# Patient Record
Sex: Male | Born: 1935
Health system: Southern US, Community
[De-identification: ages and names within clinical notes are randomized; demographics above are authoritative.]

## PROBLEM LIST (undated history)

## (undated) DIAGNOSIS — K5090 Crohn's disease, unspecified, without complications: Secondary | ICD-10-CM

## (undated) DIAGNOSIS — I739 Peripheral vascular disease, unspecified: Secondary | ICD-10-CM

## (undated) DIAGNOSIS — I2510 Atherosclerotic heart disease of native coronary artery without angina pectoris: Secondary | ICD-10-CM

## (undated) DIAGNOSIS — I340 Nonrheumatic mitral (valve) insufficiency: Secondary | ICD-10-CM

## (undated) DIAGNOSIS — D649 Anemia, unspecified: Secondary | ICD-10-CM

## (undated) DIAGNOSIS — E7800 Pure hypercholesterolemia, unspecified: Secondary | ICD-10-CM

## (undated) DIAGNOSIS — I779 Disorder of arteries and arterioles, unspecified: Secondary | ICD-10-CM

## (undated) DIAGNOSIS — I350 Nonrheumatic aortic (valve) stenosis: Secondary | ICD-10-CM

## (undated) DIAGNOSIS — K922 Gastrointestinal hemorrhage, unspecified: Secondary | ICD-10-CM

## (undated) DIAGNOSIS — I10 Essential (primary) hypertension: Secondary | ICD-10-CM

## (undated) DIAGNOSIS — I219 Acute myocardial infarction, unspecified: Secondary | ICD-10-CM

## (undated) DIAGNOSIS — C4490 Unspecified malignant neoplasm of skin, unspecified: Secondary | ICD-10-CM

## (undated) DIAGNOSIS — K449 Diaphragmatic hernia without obstruction or gangrene: Secondary | ICD-10-CM

## (undated) DIAGNOSIS — M1990 Unspecified osteoarthritis, unspecified site: Secondary | ICD-10-CM

## (undated) DIAGNOSIS — R42 Dizziness and giddiness: Secondary | ICD-10-CM

## (undated) HISTORY — PX: INGUINAL HERNIA REPAIR: SUR1180

## (undated) HISTORY — PX: CORONARY STENT PLACEMENT: SHX1402

## (undated) HISTORY — PX: CHOLECYSTECTOMY: SHX55

## (undated) HISTORY — DX: Nonrheumatic mitral (valve) insufficiency: I34.0

## (undated) HISTORY — DX: Anemia, unspecified: D64.9

## (undated) HISTORY — DX: Disorder of arteries and arterioles, unspecified: I77.9

## (undated) HISTORY — DX: Peripheral vascular disease, unspecified: I73.9

## (undated) HISTORY — DX: Nonrheumatic aortic (valve) stenosis: I35.0

## (undated) HISTORY — DX: Gastrointestinal hemorrhage, unspecified: K92.2

---

## 1998-04-11 ENCOUNTER — Encounter: Payer: Self-pay | Admitting: Family Medicine

## 1998-04-11 ENCOUNTER — Ambulatory Visit (HOSPITAL_COMMUNITY): Admission: RE | Admit: 1998-04-11 | Discharge: 1998-04-11 | Payer: Self-pay | Admitting: Family Medicine

## 1998-08-17 ENCOUNTER — Encounter: Payer: Self-pay | Admitting: Gastroenterology

## 1998-08-17 ENCOUNTER — Ambulatory Visit (HOSPITAL_COMMUNITY): Admission: RE | Admit: 1998-08-17 | Discharge: 1998-08-17 | Payer: Self-pay | Admitting: Gastroenterology

## 1998-11-10 ENCOUNTER — Encounter: Payer: Self-pay | Admitting: Emergency Medicine

## 1998-11-10 ENCOUNTER — Inpatient Hospital Stay (HOSPITAL_COMMUNITY): Admission: EM | Admit: 1998-11-10 | Discharge: 1998-11-13 | Payer: Self-pay | Admitting: Emergency Medicine

## 1999-05-31 ENCOUNTER — Ambulatory Visit (HOSPITAL_COMMUNITY): Admission: RE | Admit: 1999-05-31 | Discharge: 1999-06-01 | Payer: Self-pay | Admitting: Cardiology

## 1999-05-31 DIAGNOSIS — I219 Acute myocardial infarction, unspecified: Secondary | ICD-10-CM

## 1999-05-31 HISTORY — DX: Acute myocardial infarction, unspecified: I21.9

## 1999-06-18 ENCOUNTER — Encounter (HOSPITAL_COMMUNITY): Admission: RE | Admit: 1999-06-18 | Discharge: 1999-09-16 | Payer: Self-pay | Admitting: Family Medicine

## 1999-09-17 ENCOUNTER — Encounter (HOSPITAL_COMMUNITY): Admission: RE | Admit: 1999-09-17 | Discharge: 1999-10-02 | Payer: Self-pay | Admitting: Family Medicine

## 1999-10-09 ENCOUNTER — Ambulatory Visit (HOSPITAL_COMMUNITY): Admission: RE | Admit: 1999-10-09 | Discharge: 1999-10-09 | Payer: Self-pay | Admitting: Gastroenterology

## 1999-11-07 ENCOUNTER — Encounter: Payer: Self-pay | Admitting: Gastroenterology

## 1999-11-07 ENCOUNTER — Ambulatory Visit (HOSPITAL_COMMUNITY): Admission: RE | Admit: 1999-11-07 | Discharge: 1999-11-07 | Payer: Self-pay | Admitting: Gastroenterology

## 2000-03-03 ENCOUNTER — Encounter: Admission: RE | Admit: 2000-03-03 | Discharge: 2000-03-03 | Payer: Self-pay | Admitting: Family Medicine

## 2000-03-03 ENCOUNTER — Encounter: Payer: Self-pay | Admitting: Family Medicine

## 2001-04-23 ENCOUNTER — Encounter: Payer: Self-pay | Admitting: Family Medicine

## 2001-04-23 ENCOUNTER — Encounter: Admission: RE | Admit: 2001-04-23 | Discharge: 2001-04-23 | Payer: Self-pay | Admitting: Family Medicine

## 2001-05-04 ENCOUNTER — Encounter: Admission: RE | Admit: 2001-05-04 | Discharge: 2001-05-04 | Payer: Self-pay | Admitting: Family Medicine

## 2001-05-04 ENCOUNTER — Encounter: Payer: Self-pay | Admitting: Family Medicine

## 2003-02-16 ENCOUNTER — Encounter: Admission: RE | Admit: 2003-02-16 | Discharge: 2003-02-16 | Payer: Self-pay | Admitting: Family Medicine

## 2003-02-16 ENCOUNTER — Encounter: Payer: Self-pay | Admitting: Family Medicine

## 2003-03-29 ENCOUNTER — Encounter: Payer: Self-pay | Admitting: Gastroenterology

## 2003-03-29 ENCOUNTER — Encounter: Admission: RE | Admit: 2003-03-29 | Discharge: 2003-03-29 | Payer: Self-pay | Admitting: Gastroenterology

## 2004-09-03 ENCOUNTER — Ambulatory Visit (HOSPITAL_COMMUNITY): Admission: RE | Admit: 2004-09-03 | Discharge: 2004-09-03 | Payer: Self-pay | Admitting: Gastroenterology

## 2005-10-28 ENCOUNTER — Encounter: Admission: RE | Admit: 2005-10-28 | Discharge: 2005-10-28 | Payer: Self-pay | Admitting: Gastroenterology

## 2006-10-16 ENCOUNTER — Encounter: Admission: RE | Admit: 2006-10-16 | Discharge: 2006-10-16 | Payer: Self-pay | Admitting: Family Medicine

## 2007-10-29 ENCOUNTER — Ambulatory Visit: Payer: Self-pay | Admitting: Vascular Surgery

## 2009-04-18 ENCOUNTER — Ambulatory Visit (HOSPITAL_COMMUNITY): Admission: RE | Admit: 2009-04-18 | Discharge: 2009-04-18 | Payer: Self-pay | Admitting: Gastroenterology

## 2009-11-16 ENCOUNTER — Encounter: Admission: RE | Admit: 2009-11-16 | Discharge: 2009-11-16 | Payer: Self-pay | Admitting: Gastroenterology

## 2010-07-07 ENCOUNTER — Encounter: Payer: Self-pay | Admitting: Gastroenterology

## 2010-10-29 NOTE — Procedures (Signed)
CAROTID DUPLEX EXAM   INDICATION:  Followup, carotid artery disease.   HISTORY:  Diabetes:  No.  Cardiac:  Stents.  Hypertension:  Yes.  Smoking:  Quit, 1960.  Previous Surgery:  No.  CV History:  No.  Amaurosis Fugax No, Paresthesias No, Hemiparesis No                                       RIGHT               LEFT  Brachial systolic pressure:         134                 130  Brachial Doppler waveforms:         Biphasic            Biphasic  Vertebral direction of flow:        Antegrade           Antegrade  DUPLEX VELOCITIES (cm/sec)  CCA peak systolic                   66                  84  ECA peak systolic                   232                 622  ICA peak systolic                   131                 95  ICA end diastolic                   40                  34  PLAQUE MORPHOLOGY:                  Calcified           Calcified  PLAQUE AMOUNT:                      Mild/Moderate       Mild  PLAQUE LOCATION:                    ICA/bifurcation/ECA ICA/bifurcation   IMPRESSION:  1. Right internal carotid artery stenosis with 40-59%.  2. Left internal carotid artery stenosis of 20-39%.  3. Right external carotid artery stenosis.  4. No significant changes from the previous study.   ___________________________________________  Nelda Severe Kellie Simmering, M.D.   AS/MEDQ  D:  10/29/2007  T:  10/29/2007  Job:  [id number]

## 2010-11-01 NOTE — Op Note (Signed)
Colin Larson, Colin Larson NO.:  [id number]   MEDICAL RECORD NO.:  [id number]          PATIENT TYPE:  AMB   LOCATION:  ENDO                         FACILITY:  [facility]   PHYSICIAN:  James L. Rolla Flatten., M.D.DATE OF BIRTH:  04/05/1934   DATE OF PROCEDURE:  09/03/2004  DATE OF DISCHARGE:                                 OPERATIVE REPORT   PROCEDURE:  Esophagogastroduodenoscopy with Savory dilatation.   MEDICATIONS:  Fentanyl 40 mcg, Versed 6 mg IV.   INDICATIONS:  The patient with a history of esophageal stricture.  He has  had this dilated in the past to 22 [place].  He has Crohn's disease which is  stable.  He has been on Nexium but then stopped it for unclear reasons.  He  has developed increasing dysphagia and for this reason repeat dilatation is  performed.   DESCRIPTION OF PROCEDURE:  The procedure including the potential risks and  benefits have been discussed with him again in the office and consent  obtained.  In the left lateral decubitus position, the Olympus scope was  inserted.  We reached the distal esophagus and in fact, there was a  stricture seen.  It was photographed.  The scope was easily able to pass.  It was a 3-4 cm hiatal hernia.  The stomach was entered, pylorus identified,  passed the duodenum including the bulb and second portion was seen,  __________ unremarkable.  The scope was withdrawn back in the stomach,  pyloric channel, antrum were normal.  Fundus and cardia were seen on  retroflexion view and were normal.  Using the portable fluoroscopy unit, the  Savory guidewire was placed through the scope and the scope was withdrawn  over the guidewire.  With the patient's neck then fully extended, we passed  a 42, 45, and 48 [place] Savory dilator with small amount of heme with the 48  [place] dilator.  This was removed with the guidewire as an unit.  The  patient tolerated the procedure well.  There were no immediate  complications.   ASSESSMENT:   Esophageal stricture, dilated to 30 [place], 530.3.   PLAN:  Routine post-dilatation orders.  We will repeat on an as-needed basis  and we will strongly recommend that he take Prilosec OTC chronically.      JLE/MEDQ  D:  09/03/2004  T:  09/03/2004  Job:  [id number]   cc:   Osvaldo Human, M.D.  [address]  [place] [address]  Fax: [phone]

## 2011-11-12 ENCOUNTER — Emergency Department (HOSPITAL_COMMUNITY)
Admission: EM | Admit: 2011-11-12 | Discharge: 2011-11-12 | Disposition: A | Discharge status: Home / Self Care | Payer: Medicare Other | Attending: Emergency Medicine | Admitting: Emergency Medicine

## 2011-11-12 ENCOUNTER — Emergency Department (HOSPITAL_COMMUNITY): Payer: Medicare Other

## 2011-11-12 ENCOUNTER — Encounter (HOSPITAL_COMMUNITY): Payer: Self-pay

## 2011-11-12 DIAGNOSIS — I252 Old myocardial infarction: Secondary | ICD-10-CM | POA: Insufficient documentation

## 2011-11-12 DIAGNOSIS — I2510 Atherosclerotic heart disease of native coronary artery without angina pectoris: Secondary | ICD-10-CM | POA: Insufficient documentation

## 2011-11-12 DIAGNOSIS — E7800 Pure hypercholesterolemia, unspecified: Secondary | ICD-10-CM | POA: Insufficient documentation

## 2011-11-12 DIAGNOSIS — M25519 Pain in unspecified shoulder: Secondary | ICD-10-CM | POA: Insufficient documentation

## 2011-11-12 DIAGNOSIS — R079 Chest pain, unspecified: Secondary | ICD-10-CM | POA: Insufficient documentation

## 2011-11-12 DIAGNOSIS — I10 Essential (primary) hypertension: Secondary | ICD-10-CM | POA: Insufficient documentation

## 2011-11-12 DIAGNOSIS — K5090 Crohn's disease, unspecified, without complications: Secondary | ICD-10-CM | POA: Insufficient documentation

## 2011-11-12 DIAGNOSIS — M79609 Pain in unspecified limb: Secondary | ICD-10-CM | POA: Insufficient documentation

## 2011-11-12 HISTORY — DX: Unspecified osteoarthritis, unspecified site: M19.90

## 2011-11-12 HISTORY — DX: Atherosclerotic heart disease of native coronary artery without angina pectoris: I25.10

## 2011-11-12 HISTORY — DX: Pure hypercholesterolemia, unspecified: E78.00

## 2011-11-12 HISTORY — DX: Diaphragmatic hernia without obstruction or gangrene: K44.9

## 2011-11-12 HISTORY — DX: Crohn's disease, unspecified, without complications: K50.90

## 2011-11-12 HISTORY — DX: Acute myocardial infarction, unspecified: I21.9

## 2011-11-12 HISTORY — DX: Essential (primary) hypertension: I10

## 2011-11-12 HISTORY — DX: Unspecified malignant neoplasm of skin, unspecified: C44.90

## 2011-11-12 LAB — CBC
MCH: 29.4 pg (ref 26.0–34.0)
MCV: 87.7 fL (ref 78.0–100.0)
Platelets: 192 10*3/uL (ref 150–400)
RDW: 13.5 % (ref 11.5–15.5)
WBC: 4.9 10*3/uL (ref 4.0–10.5)

## 2011-11-12 LAB — POCT I-STAT, CHEM 8
Calcium, Ion: 1.13 mmol/L (ref 1.12–1.32)
Chloride: 106 mEq/L (ref 96–112)
HCT: 35 % — ABNORMAL LOW (ref 39.0–52.0)
TCO2: 24 mmol/L (ref 0–100)

## 2011-11-12 LAB — POCT I-STAT TROPONIN I: Troponin i, poc: 0 ng/mL (ref 0.00–0.08)

## 2011-11-12 NOTE — ED Notes (Signed)
The patient states that he had a brief five second period of chest pain one hour ago while bearing down during BM.  He states that the pain was located in his left center chest and that it radiated to his left arm and his back.  He denies N/V, diaphoresis, and SOB.  The patient states he did NOT take any of his SL nitro-stat, but he did take ASA 324 mg, po prior to arrival.  The patient has a hx of an MI on May 31, 1999 at which time he had stents x3 placed.

## 2011-11-12 NOTE — ED Notes (Signed)
Deneis  Pain or SOB.  Monitor with SR rate 75   Room air sats 96%

## 2011-11-12 NOTE — Discharge Instructions (Signed)
Your caregiver has diagnosed you as having chest pain that is nonspecific for one problem. This means that after looking at you and examining you and ordering tests (such as blood work, chest x-rays and EKG), your caregiver does not believe that the problem is serious enough to need watching in the hospital. This judgment is often made after testing shows no acute heart attack and you are at low risk for sudden acute heart condition. Chest pain comes from many different causes.  Seek immediate medical attention if:   You have severe chest pain, especially if the pain is crushing or pressure-like and spreads to the arms, back, neck, or jaw, or if you have sweating, nausea, shortness of breath. This is an emergency. Don't wait to see if the pain will go away. Get medical help at once. Call 911 immediately. Do not drive herself to the hospital.   Your chest pain gets worse and does not go away with rest.   You have an attack of chest pain lasting longer than usual, despite rest and treatment with the medications your caregiver has prescribed   You awaken from sleep with chest pain or shortness of breath.   You feel faint or dizzy   You have chest pain not typical of your usual pain for which you originally saw your caregiver.  You must have a repeat evaluation within 24 hours for a recheck of your heart.  Please call your doctor this morning to schedule this appointment. If you do not have a family doctor, please see the list of doctors below.  RESOURCE GUIDE  Dental Problems  Patients with Medicaid: [place] Phone:  [phone]                                                  Phone:  [phone]  If unable to pay or uninsured, contact:  [facility] or [facility]. to become qualified for the adult dental clinic.  Chronic Pain  Problems Contact [facility]  [phone] Patients need to be referred by their primary care doctor.  Insufficient Money for Medicine Contact [employer]:  call "211" or [place] [phone].  No Primary Care Doctor Call [facility]  [phone] Other agencies that provide inexpensive medical care    [place]  [phone]    [facility] Internal [facility]  [place]  [phone]    [facility]  [phone]    [facility]  [place]  [place]  [phone] Portal   [phone] (emergency services [phone])  Sub[facility]  [phone] [facility] [phone] [facility] [place] [phone] Chinita Pester [phone] Residential & Outpatient Substance Abuse Program  [place] [phone] [place]  [place] [phone] (After Hours)  Emergency [place] [phone]  [place] at the [place] [phone] [place] [phone]  MRSA Hotline #:   5163944898    [facility] Dept. [address]      [place] Phone:  [phone]                                   Phone:  [phone]                 Phone:  [place] Phone:  [place] [phone] (After Hours)

## 2011-11-12 NOTE — ED Provider Notes (Signed)
History     CSN: [id number]  Arrival date & time 11/12/11  0308   First MD Initiated Contact with Patient 11/12/11 [phone]      Chief Complaint  Patient presents with  . Chest Pain    (Consider location/radiation/quality/duration/timing/severity/associated sxs/prior treatment) HPI Comments: 77 year old male with a history of coronary artery disease status post stenting in the year 2000, hypercholesterolemia, hypertension. He presents with a complaint of chest pain which was left-sided. He states this was acute in onset while he was using the bathroom having a bowel movement. It lasted for approximately 3 seconds, radiated to the shoulder and the arm and then completely went away. Again it only lasted for 3 seconds and he has had no symptoms since that time. This occurred just prior to arrival. He denies headaches, blurred vision, sore throat, cough or shortness of breath, back pain, abdominal pain, dysuria, diarrhea, rectal bleeding, swelling, rashes, weakness, numbness. He denies that this is anything that he has ever had in the past  Patient is a 77 y.o. male presenting with chest pain. The history is provided by the patient.  Chest Pain     Past Medical History  Diagnosis Date  . MI (myocardial infarction) [id number]  . Crohn disease   . Hiatal hernia     no surgery  . Hypertension   . Hypercholesteremia   . Arthritis   . Skin cancer   . Coronary artery disease     Past Surgical History  Procedure Date  . Coronary stent placement [id number]  . Cholecystectomy   . Inguinal hernia repair     x 2    Family History  Problem Relation Age of Onset  . Cancer Mother   . Stroke Father     History  Substance Use Topics  . Smoking status: Former Smoker -- 0.1 packs/day for 5 years    Quit date: 02/15/1975  . Smokeless tobacco: Not on file  . Alcohol Use: No      Review of Systems  Cardiovascular: Positive for chest pain.  All other systems reviewed and are  negative.    Allergies  Bee venom  Home Medications   Current Outpatient Rx  Name Route Sig Dispense Refill  . ASPIRIN 325 MG PO TABS Oral Take 325 mg by mouth daily.    Marland Kitchen EZETIMIBE 10 MG PO TABS Oral Take 10 mg by mouth daily.    Marland Kitchen MESALAMINE 400 MG PO TBEC Oral Take 400 mg by mouth 2 (two) times daily.    Marland Kitchen NITROGLYCERIN 0.4 MG SL SUBL Sublingual Place 0.4 mg under the tongue every 5 (five) minutes as needed. For chest pain    . OMEPRAZOLE 20 MG PO CPDR Oral Take 20 mg by mouth 2 (two) times daily.    Marland Kitchen RAMIPRIL 5 MG PO CAPS Oral Take 5 mg by mouth daily.    Marland Kitchen ROSUVASTATIN CALCIUM 20 MG PO TABS Oral Take 20 mg by mouth daily.      BP 155/83  Pulse 72  Temp(Src) 97.8 F (36.6 C) (Oral)  Resp 18  Ht 5' 7"  (1.702 m)  Wt 172 lb (78.019 kg)  BMI 26.94 kg/m2  SpO2 97%  Physical Exam  Nursing note and vitals reviewed. Constitutional: He appears well-developed and well-nourished. No distress.  HENT:  Head: Normocephalic and atraumatic.  Mouth/Throat: Oropharynx is clear and moist. No oropharyngeal exudate.  Eyes: Conjunctivae and EOM are normal. Pupils are equal, round, and reactive to light. Right eye exhibits no discharge.  Left eye exhibits no discharge. No scleral icterus.  Neck: Normal range of motion. Neck supple. No JVD present. No thyromegaly present.  Cardiovascular: Normal rate, regular rhythm, normal heart sounds and intact distal pulses.  Exam reveals no gallop and no friction rub.   No murmur heard. Pulmonary/Chest: Effort normal and breath sounds normal. No respiratory distress. He has no wheezes. He has no rales.  Abdominal: Soft. Bowel sounds are normal. He exhibits no distension and no mass. There is no tenderness.  Musculoskeletal: Normal range of motion. He exhibits no edema and no tenderness.  Lymphadenopathy:    He has no cervical adenopathy.  Neurological: He is alert. Coordination normal.  Skin: Skin is warm and dry. No rash noted. No erythema.   Psychiatric: He has a normal mood and affect. His behavior is normal.    ED Course  Procedures (including critical care time)  ED ECG REPORT   Date: 11/12/2011   Rate: 73  Rhythm: normal sinus rhythm  QRS Axis: normal  Intervals: normal  ST/T Wave abnormalities: nonspecific T wave changes  Conduction Disutrbances:none  Narrative Interpretation:   Old EKG Reviewed: none available   Labs Reviewed  CBC - Abnormal; Notable for the following:    RBC 3.98 (*)    Hemoglobin 11.7 (*)    HCT 34.9 (*)    All other components within normal limits  POCT I-STAT, CHEM 8 - Abnormal; Notable for the following:    Hemoglobin 11.9 (*)    HCT 35.0 (*)    All other components within normal limits  POCT I-STAT TROPONIN I   Dg Chest Port 1 View  11/12/2011  *RADIOLOGY REPORT*  Clinical Data: Shortness of breath and chest discomfort.  PORTABLE CHEST - 1 VIEW  Comparison: None.  Findings: Shallow inspiration.  Heart size and pulmonary vascularity are normal for technique.  No focal airspace consolidation in the lungs.  No blunting of costophrenic angles. No pneumothorax.  Tortuous aorta.  IMPRESSION: No evidence of active pulmonary disease.  Original Report Authenticated By: Neale Burly, M.D.     1. Chest pain       MDM  Physical exam is totally normal at this time, his blood pressure is 155/83 and his EKG is normal other than some nonspecific changes in the inferior leads. I do not have an old EKG with which to compare. There is no signs of ST elevation or ST depression.  Patient reevaluated and has had no further chest pain since arrival, x-ray and labs without any significant findings, these were discussed with the patient and he was encouraged to follow up very closely. Doubt acute coronary syndrome given the way the patient explained his symptoms for only several seconds of pain.  Johnna Acosta, MD 11/12/11 [phone]

## 2011-12-29 DIAGNOSIS — E7800 Pure hypercholesterolemia, unspecified: Secondary | ICD-10-CM | POA: Insufficient documentation

## 2011-12-29 DIAGNOSIS — I10 Essential (primary) hypertension: Secondary | ICD-10-CM | POA: Insufficient documentation

## 2012-01-07 DIAGNOSIS — C439 Malignant melanoma of skin, unspecified: Secondary | ICD-10-CM

## 2012-01-07 HISTORY — DX: Malignant melanoma of skin, unspecified: C43.9

## 2013-03-14 ENCOUNTER — Encounter: Payer: Self-pay | Admitting: Interventional Cardiology

## 2013-03-22 ENCOUNTER — Ambulatory Visit (INDEPENDENT_AMBULATORY_CARE_PROVIDER_SITE_OTHER): Payer: Medicare Other | Admitting: *Deleted

## 2013-03-22 DIAGNOSIS — E876 Hypokalemia: Secondary | ICD-10-CM

## 2013-03-22 DIAGNOSIS — I10 Essential (primary) hypertension: Secondary | ICD-10-CM

## 2013-03-22 LAB — BASIC METABOLIC PANEL
BUN: 20 mg/dL (ref 6–23)
Calcium: 9.2 mg/dL (ref 8.4–10.5)
Creatinine, Ser: 1.1 mg/dL (ref 0.4–1.5)
GFR: 67.76 mL/min (ref >60.00)

## 2013-03-23 MED ORDER — POTASSIUM CHLORIDE CRYS ER 20 MEQ PO TBCR: 20.0000 meq | EXTENDED_RELEASE_TABLET | Freq: Two times a day (BID) | ORAL | Status: DC

## 2013-03-29 ENCOUNTER — Other Ambulatory Visit (INDEPENDENT_AMBULATORY_CARE_PROVIDER_SITE_OTHER): Payer: Medicare Other

## 2013-03-29 DIAGNOSIS — E876 Hypokalemia: Secondary | ICD-10-CM

## 2013-03-29 LAB — BASIC METABOLIC PANEL
Calcium: 9.2 mg/dL (ref 8.4–10.5)
GFR: 68.46 mL/min (ref >60.00)
Sodium: 138 mEq/L (ref 135–145)

## 2013-05-04 ENCOUNTER — Encounter (HOSPITAL_BASED_OUTPATIENT_CLINIC_OR_DEPARTMENT_OTHER): Payer: Self-pay | Admitting: Emergency Medicine

## 2013-05-04 ENCOUNTER — Observation Stay (HOSPITAL_BASED_OUTPATIENT_CLINIC_OR_DEPARTMENT_OTHER)
Admission: EM | Admit: 2013-05-04 | Discharge: 2013-05-06 | Disposition: A | Discharge status: Home / Self Care | Payer: Medicare Other | Attending: Family Medicine | Admitting: Family Medicine

## 2013-05-04 DIAGNOSIS — I10 Essential (primary) hypertension: Secondary | ICD-10-CM | POA: Insufficient documentation

## 2013-05-04 DIAGNOSIS — K5730 Diverticulosis of large intestine without perforation or abscess without bleeding: Principal | ICD-10-CM | POA: Insufficient documentation

## 2013-05-04 DIAGNOSIS — R197 Diarrhea, unspecified: Secondary | ICD-10-CM | POA: Insufficient documentation

## 2013-05-04 DIAGNOSIS — K625 Hemorrhage of anus and rectum: Secondary | ICD-10-CM

## 2013-05-04 DIAGNOSIS — I252 Old myocardial infarction: Secondary | ICD-10-CM | POA: Insufficient documentation

## 2013-05-04 DIAGNOSIS — K5731 Diverticulosis of large intestine without perforation or abscess with bleeding: Secondary | ICD-10-CM | POA: Diagnosis present

## 2013-05-04 DIAGNOSIS — E782 Mixed hyperlipidemia: Secondary | ICD-10-CM | POA: Diagnosis present

## 2013-05-04 DIAGNOSIS — Z87891 Personal history of nicotine dependence: Secondary | ICD-10-CM | POA: Insufficient documentation

## 2013-05-04 DIAGNOSIS — I2510 Atherosclerotic heart disease of native coronary artery without angina pectoris: Secondary | ICD-10-CM | POA: Diagnosis present

## 2013-05-04 DIAGNOSIS — K5090 Crohn's disease, unspecified, without complications: Secondary | ICD-10-CM | POA: Diagnosis present

## 2013-05-04 DIAGNOSIS — Z7982 Long term (current) use of aspirin: Secondary | ICD-10-CM | POA: Insufficient documentation

## 2013-05-04 DIAGNOSIS — E785 Hyperlipidemia, unspecified: Secondary | ICD-10-CM | POA: Insufficient documentation

## 2013-05-04 DIAGNOSIS — K219 Gastro-esophageal reflux disease without esophagitis: Secondary | ICD-10-CM | POA: Diagnosis present

## 2013-05-04 LAB — BASIC METABOLIC PANEL
CO2: 29 mEq/L (ref 19–32)
GFR calc non Af Amer: 71 mL/min — ABNORMAL LOW (ref >90)
Glucose, Bld: 103 mg/dL — ABNORMAL HIGH (ref 70–99)
Potassium: 3.8 mEq/L (ref 3.5–5.1)
Sodium: 141 mEq/L (ref 135–145)

## 2013-05-04 LAB — OCCULT BLOOD X 1 CARD TO LAB, STOOL: Fecal Occult Bld: POSITIVE — AB

## 2013-05-04 LAB — CBC
Hemoglobin: 12.4 g/dL — ABNORMAL LOW (ref 13.0–17.0)
MCHC: 32.4 g/dL (ref 30.0–36.0)
RBC: 4.25 MIL/uL (ref 4.22–5.81)

## 2013-05-04 LAB — HEMOGLOBIN AND HEMATOCRIT, BLOOD
HCT: 33 % — ABNORMAL LOW (ref 39.0–52.0)
Hemoglobin: 11.1 g/dL — ABNORMAL LOW (ref 13.0–17.0)

## 2013-05-04 MED ORDER — MESALAMINE 800 MG PO TBEC: 800.0000 mg | DELAYED_RELEASE_TABLET | Freq: Three times a day (TID) | ORAL | Status: DC

## 2013-05-04 MED ORDER — RAMIPRIL 5 MG PO CAPS
5.0000 mg | ORAL_CAPSULE | Freq: Every day | ORAL | Status: DC
  Administered 2013-05-04 – 2013-05-06 (×3): 5 mg via ORAL
  Filled 2013-05-04 (×3): qty 1

## 2013-05-04 MED ORDER — PANTOPRAZOLE SODIUM 40 MG PO TBEC
40.0000 mg | DELAYED_RELEASE_TABLET | Freq: Every day | ORAL | Status: DC
  Administered 2013-05-04 – 2013-05-06 (×3): 40 mg via ORAL
  Filled 2013-05-04 (×3): qty 1

## 2013-05-04 MED ORDER — ONDANSETRON HCL 4 MG PO TABS: 4.0000 mg | ORAL_TABLET | Freq: Four times a day (QID) | ORAL | Status: DC | PRN

## 2013-05-04 MED ORDER — ATORVASTATIN CALCIUM 40 MG PO TABS
40.0000 mg | ORAL_TABLET | Freq: Every day | ORAL | Status: DC
  Administered 2013-05-04 – 2013-05-05 (×2): 40 mg via ORAL
  Filled 2013-05-04 (×3): qty 1

## 2013-05-04 MED ORDER — EZETIMIBE 10 MG PO TABS
10.0000 mg | ORAL_TABLET | Freq: Every day | ORAL | Status: DC
  Administered 2013-05-04 – 2013-05-06 (×3): 10 mg via ORAL
  Filled 2013-05-04 (×3): qty 1

## 2013-05-04 MED ORDER — SODIUM CHLORIDE 0.9 % IV SOLN
INTRAVENOUS | Status: DC
  Administered 2013-05-05 – 2013-05-06 (×4): via INTRAVENOUS

## 2013-05-04 MED ORDER — MESALAMINE 800 MG PO TBEC
800.0000 mg | DELAYED_RELEASE_TABLET | Freq: Three times a day (TID) | ORAL | Status: DC
  Administered 2013-05-04 – 2013-05-06 (×6): 800 mg via ORAL
  Filled 2013-05-04: qty 1

## 2013-05-04 MED ORDER — NITROGLYCERIN 0.4 MG SL SUBL: 0.4000 mg | SUBLINGUAL_TABLET | SUBLINGUAL | Status: DC | PRN

## 2013-05-04 MED ORDER — ONDANSETRON HCL 4 MG/2ML IJ SOLN: 4.0000 mg | Freq: Four times a day (QID) | INTRAMUSCULAR | Status: DC | PRN

## 2013-05-04 MED ORDER — MORPHINE SULFATE 2 MG/ML IJ SOLN: 1.0000 mg | INTRAMUSCULAR | Status: DC | PRN

## 2013-05-04 NOTE — ED Provider Notes (Signed)
This patient was seen in conjunction with the Resident Physician, Dr. Wendi Snipes.  On my exam this patient was in no distress.  He specifically denied any pain, other symptoms beyond rectal bleeding.  Patient was hemodynamically stable.  With his history of Crohn's disease, we discussed his case with his gastroenterologist, then the patient was admitted for further evaluation and management.  Carmin Muskrat, MD 05/04/13 [phone]

## 2013-05-04 NOTE — Consult Note (Signed)
Reason for Consult: Probable diverticular bleeding Referring Physician: ER physician  Colin Larson is an 78 y.o. male.  HPI: Patient well-known to my partner Dr. Randa Evens and the patient was seen and examined and both hospital computer chart and our office computer chart were reviewed and his had bright red blood per rectum without obvious clots for 2 days without any other GI symptoms and no previous history of bleeding and has had diverticuli on previous colonoscopy as well as some mild terminal ileum Crohn's which is essentially asymptomatic however he is on a daily aspirin full strength and will use some Advil periodically and his family history is negative and we had a long talk about diverticular bleeding and he has no other complaints  Past Medical History  Diagnosis Date  . MI (myocardial infarction) [id number]  . Crohn disease   . Hiatal hernia     no surgery  . Hypertension   . Hypercholesteremia   . Arthritis   . Skin cancer   . Coronary artery disease     Past Surgical History  Procedure Laterality Date  . Coronary stent placement  [id number]  . Cholecystectomy    . Inguinal hernia repair      x 2    Family History  Problem Relation Age of Onset  . Cancer Mother   . Stroke Father     Social History:  reports that he quit smoking about 38 years ago. He does not have any smokeless tobacco history on file. He reports that he does not drink alcohol or use illicit drugs.  Allergies:  Allergies  Allergen Reactions  . Bee Venom Swelling    Medications: I have reviewed the patient's current medications.  Results for orders placed during the hospital encounter of 05/04/13 (from the past 48 hour(s))  CBC     Status: Abnormal   Collection Time    05/04/13 12:20 PM      Result Value Range   WBC 6.1  4.0 - 10.5 K/uL   RBC 4.25  4.22 - 5.81 MIL/uL   Hemoglobin 12.4 (*) 13.0 - 17.0 g/dL   HCT 69.6 (*) 29.5 - 28.4 %   MCV 90.1  78.0 - 100.0 fL   MCH 29.2  26.0 - 34.0  pg   MCHC 32.4  30.0 - 36.0 g/dL   RDW 13.2  44.0 - 10.2 %   Platelets 242  150 - 400 K/uL  BASIC METABOLIC PANEL     Status: Abnormal   Collection Time    05/04/13 12:20 PM      Result Value Range   Sodium 141  135 - 145 mEq/L   Potassium 3.8  3.5 - 5.1 mEq/L   Chloride 103  96 - 112 mEq/L   CO2 29  19 - 32 mEq/L   Glucose, Bld 103 (*) 70 - 99 mg/dL   BUN 12  6 - 23 mg/dL   Creatinine, Ser 7.25  0.50 - 1.35 mg/dL   Calcium 9.6  8.4 - 36.6 mg/dL   GFR calc non Af Amer 71 (*) >90 mL/min   GFR calc Af Amer 82 (*) >90 mL/min   Comment: (NOTE)     The eGFR has been calculated using the CKD EPI equation.     This calculation has not been validated in all clinical situations.     eGFR's persistently <90 mL/min signify possible Chronic Kidney     Disease.  OCCULT BLOOD X 1 CARD TO LAB, STOOL  Status: Abnormal   Collection Time    05/04/13 12:41 PM      Result Value Range   Fecal Occult Bld POSITIVE (*) NEGATIVE    No results found.  ROS negative except above Blood pressure 151/80, pulse 57, temperature 97.8 F (36.6 C), temperature source Oral, resp. rate 20, height 5\' 7"  (1.702 m), weight 68.856 kg (151 lb 12.8 oz), SpO2 97.00%. Physical Exam Vital signs stable afebrile no acute distress labs reviewed abdomen is soft nontender Assessment/Plan: Probable diverticular bleeding Plan: Clear liquid diet for now follow H&H and stools and consider nuclear bleeding scan first if signs of increased active bleeding but he has not had a bowel movement since 11 AM and feels okay and I will check on tomorrow and call us sooner when necessary and hold aspirin and nonsteroidals for now and will need to decide on discharge when he should restart them and at what dose [facility] E 05/04/2013, 4:48 PM

## 2013-05-04 NOTE — Progress Notes (Signed)
Patient discussed with the physician at [facility] and will be admitted by the hospitalist and his office and hospital computer was reviewed and it sounds like he has a diverticular bleed and I would recommend holding aspirin and clear liquid diet and consider nuclear bleeding scan if bleeding continues and please call us when necessary or we will check on in the morning and he's had multiple colonoscopies the last one being 2012 showing sigmoid primarily and some descending diverticuli as well as terminal ileum Crohn's

## 2013-05-04 NOTE — Progress Notes (Signed)
Patient had a bowel movement that was brown, with some redness to it.  No obvious clots or bright red blood.  Philomena Doheny RN

## 2013-05-04 NOTE — ED Notes (Signed)
Pt c/o blood in stool with bowel movements since yesterday. Pt denies h/o same. Pt GI MD is Edwards with [employer]. Denies pain.

## 2013-05-04 NOTE — H&P (Signed)
[facility] History and Physical  Colin Larson NGE:[id number] DOB: 01-Sep-1934 DOA: 05/04/2013  Referring physician: Vanita Panda, ER physician PCP: Mayra Neer, MD  Specialists: [facility]  Chief Complaint: Bleeding per rectum  HPI: Colin Larson is a 78 y.o. male  Past medical history of Crohn's disease and MI who this morning was wiping and noted a large amount of blood in his bowel movement. He then had a second bowel movement a little while later and continued to have bright red blood. No abdominal pain was noted. This is new for him. No other symptoms such as fever or nausea or vomiting. Patient then went to the emergency room for further evaluation. In the emergency room, he was noted to have normal BUN and creatinine and hemoglobin of 12.41 in comparison to previous records this is actually better. MCV is normal. This was discussed with gastroenterology as patient sees Dr. Oletta Lamas of [facility] gastroenterology. They recommended observation overnight and likely this was diverticular bleeding. Patient was transferred to the hospitalist service at [facility] long.  Review of Systems: Patient seen after arrival to floor. Doing okay. Denies any headaches, vision changes, dysphasia, chest pain, palpitations, shortness of breath, wheeze, cough, abdominal pain, hematuria, dysuria, constipation or diarrhea. Denies any dark black or a stool. Denies any focal extremity numbness or weakness or pain. Only complaint is of bright red blood per rectum  Past Medical History  Diagnosis Date  . MI (myocardial infarction) [id number]  . Crohn disease   . Hiatal hernia     no surgery  . Hypertension   . Hypercholesteremia   . Arthritis   . Skin cancer   . Coronary artery disease    Past Surgical History  Procedure Laterality Date  . Coronary stent placement  [id number]  . Cholecystectomy    . Inguinal hernia repair      x 2   Social History:  reports that he quit smoking  about 38 years ago. He does not have any smokeless tobacco history on file. He reports that he does not drink alcohol or use illicit drugs. Patient is still active, works as a Presenter, broadcasting. He is married and is able to participate in all activities of daily living without assistance  Allergies  Allergen Reactions  . Bee Venom Swelling    Family History  Problem Relation Age of Onset  . Cancer Mother   . Stroke Father     Prior to Admission medications   Medication Sig Start Date End Date Taking? Authorizing Provider  aspirin 325 MG tablet Take 325 mg by mouth daily.   Yes Historical Provider, MD  atorvastatin (LIPITOR) 80 MG tablet Take 80 mg by mouth daily at 6 PM.   Yes Historical Provider, MD  ibuprofen (ADVIL,MOTRIN) 200 MG tablet Take 400 mg by mouth daily as needed for moderate pain.   Yes Historical Provider, MD  Mesalamine 800 MG TBEC Take 800 mg by mouth 3 (three) times daily.   Yes Historical Provider, MD  Multiple Vitamins-Minerals (CENTRUM SILVER ULTRA MENS PO) Take 1 tablet by mouth daily.   Yes Historical Provider, MD  nitroGLYCERIN (NITROSTAT) 0.4 MG SL tablet Place 0.4 mg under the tongue every 5 (five) minutes as needed. For chest pain   Yes Historical Provider, MD  omeprazole (PRILOSEC) 20 MG capsule Take 20 mg by mouth daily.    Yes Historical Provider, MD  ramipril (ALTACE) 5 MG capsule Take 5 mg by mouth daily.   Yes Historical Provider, MD  ezetimibe (  ZETIA) 10 MG tablet Take 10 mg by mouth daily.    Historical Provider, MD  potassium chloride SA (KLOR-CON M20) 20 MEQ tablet Take 1 tablet (20 mEq total) by mouth 2 (two) times daily. 03/23/13   Casandra Doffing, MD  rosuvastatin (CRESTOR) 20 MG tablet Take 20 mg by mouth daily.    Historical Provider, MD   Physical Exam: Filed Vitals:   05/04/13 1625  BP: 151/80  Pulse: 57  Temp: 97.8 F (36.6 C)  Resp: 20     General:  Alert and oriented x3, no acute distress  Eyes: Sclera nonicteric, extraocular movements  are intact  ENT: Normocephalic major medical mucous members are slightly dry  Neck: Supple, no JVD  Cardiovascular: Regular rate and rhythm, S1-S2  Respiratory: Clear to auscultation bilaterally  Abdomen: Soft, obese, nontender, positive bowel sounds  Skin: No skin breaks, tears or lesions  Musculoskeletal: No clubbing, cyanosis or edema  Psychiatric: Patient is appropriate, no evidence of psychosis  Neurologic: No overt deficits  Labs on Admission:  Basic Metabolic Panel:  Recent Labs Lab 05/04/13 1220  NA 141  K 3.8  CL 103  CO2 29  GLUCOSE 103*  BUN 12  CREATININE 1.00  CALCIUM 9.6   Liver Function Tests: No results found for this basename: AST, ALT, ALKPHOS, BILITOT, PROT, ALBUMIN,  in the last 168 hours No results found for this basename: LIPASE, AMYLASE,  in the last 168 hours No results found for this basename: AMMONIA,  in the last 168 hours CBC:  Recent Labs Lab 05/04/13 1220  WBC 6.1  HGB 12.4*  HCT 38.3*  MCV 90.1  PLT 242   Cardiac Enzymes: No results found for this basename: CKTOTAL, CKMB, CKMBINDEX, TROPONINI,  in the last 168 hours  BNP (last 3 results) No results found for this basename: PROBNP,  in the last 8760 hours CBG: No results found for this basename: GLUCAP,  in the last 168 hours  Radiological Exams on Admission: No results found.   Assessment/Plan Principal Problem:   Diverticulosis of colon with hemorrhage: Likely diverticular bleed. Patient her blood, painless and already slightly improving. Clear liquid diet and recheck labs in the morning. Hold aspirin. Active Problems:   Other and unspecified hyperlipidemia: Patient on Crestor and Lipitor both, we'll clarify and discontinue 1 upon discharge.    GERD (gastroesophageal reflux disease): Continue PPI.    CAD (coronary artery disease), native coronary artery: Holding aspirin for now.    Crohn disease: Continue mesalamine     Code Status: DO NOT  RESUSCITATE  Family Communication: Will leave message with family, no one at bedside  Disposition Plan: Home possibly tomorrow  Time spent: 25 minutes  [facility] Pager [phone]  If 7PM-7AM, please contact night-coverage [email or web address] Password [email or web address] 05/04/2013, 5:06 PM

## 2013-05-04 NOTE — ED Provider Notes (Signed)
CSN: [id number]     Arrival date & time 05/04/13  1151 History   First MD Initiated Contact with Patient 05/04/13 1157     Chief Complaint  Patient presents with  . GI Bleeding   (Consider location/radiation/quality/duration/timing/severity/associated sxs/prior Treatment) HPI  78 y/o male here with painless rectal bleeding X 2 days. States that his bleeding began yesterday and was approx 1 teaspoon at first. He's had 5 more BMs and episodes of bleeding since and now describes the amount as 1/2 to 1 cup of blood with each BM. He denies any abd pain but states that he has a HX of diverticulosis and chron's disease. He states that today he feels weak but denies any dizziness or light headedness. He takes a daily aspirin after an MI where he had 3 stents placed.   Past Medical History  Diagnosis Date  . MI (myocardial infarction) [id number]  . Crohn disease   . Hiatal hernia     no surgery  . Hypertension   . Hypercholesteremia   . Arthritis   . Skin cancer   . Coronary artery disease    Past Surgical History  Procedure Laterality Date  . Coronary stent placement  [id number]  . Cholecystectomy    . Inguinal hernia repair      x 2   Family History  Problem Relation Age of Onset  . Cancer Mother   . Stroke Father    History  Substance Use Topics  . Smoking status: Former Smoker -- 0.14 packs/day for 5 years    Quit date: 02/15/1975  . Smokeless tobacco: Not on file  . Alcohol Use: No    Review of Systems  Constitutional: Negative for fever, chills and diaphoresis.  HENT: Negative for sore throat.   Eyes: Negative for visual disturbance.  Respiratory: Negative for cough and shortness of breath.   Gastrointestinal: Negative for nausea, vomiting, abdominal pain and diarrhea.  Genitourinary: Negative for dysuria.  Musculoskeletal: Negative for back pain.  Skin: Negative for rash.  Neurological: Negative for headaches.  All other systems reviewed and are  negative.    Allergies  Bee venom  Home Medications   Current Outpatient Rx  Name  Route  Sig  Dispense  Refill  . aspirin 325 MG tablet   Oral   Take 325 mg by mouth daily.         Marland Kitchen ezetimibe (ZETIA) 10 MG tablet   Oral   Take 10 mg by mouth daily.         . mesalamine (ASACOL) 400 MG EC tablet   Oral   Take 400 mg by mouth 2 (two) times daily.         . nitroGLYCERIN (NITROSTAT) 0.4 MG SL tablet   Sublingual   Place 0.4 mg under the tongue every 5 (five) minutes as needed. For chest pain         . omeprazole (PRILOSEC) 20 MG capsule   Oral   Take 20 mg by mouth 2 (two) times daily.         . potassium chloride SA (KLOR-CON M20) 20 MEQ tablet   Oral   Take 1 tablet (20 mEq total) by mouth 2 (two) times daily.   90 tablet   3   . ramipril (ALTACE) 5 MG capsule   Oral   Take 5 mg by mouth daily.         . rosuvastatin (CRESTOR) 20 MG tablet   Oral   Take 20 mg  by mouth daily.          BP 164/73  Pulse 87  Temp(Src) 98.1 F (36.7 C) (Oral)  Resp 20  SpO2 100% Physical Exam  Constitutional: He is oriented to person, place, and time. He appears well-developed and well-nourished. No distress.  HENT:  Head: Normocephalic and atraumatic.  Eyes: EOM are normal. Pupils are equal, round, and reactive to light.  Neck: Normal range of motion. Neck supple.  Cardiovascular: Normal rate, regular rhythm and normal heart sounds.   Pulmonary/Chest: Effort normal and breath sounds normal. No respiratory distress. He has no wheezes.  Abdominal: Soft. Bowel sounds are normal. There is no tenderness.  Genitourinary: Rectum normal. Rectal exam shows no external hemorrhoid, no internal hemorrhoid, no fissure, no mass, no tenderness and anal tone normal. Prostate is enlarged. Prostate is not tender.  Musculoskeletal: He exhibits no edema.  Neurological: He is alert and oriented to person, place, and time.  Skin: Skin is warm and dry. He is not diaphoretic.   Psychiatric: He has a normal mood and affect.    ED Course  Procedures (including critical care time) Labs Review Labs Reviewed  CBC - Abnormal; Notable for the following:    Hemoglobin 12.4 (*)    HCT 38.3 (*)    All other components within normal limits  BASIC METABOLIC PANEL - Abnormal; Notable for the following:    Glucose, Bld 103 (*)    GFR calc non Af Amer 71 (*)    GFR calc Af Amer 82 (*)    All other components within normal limits  OCCULT BLOOD X 1 CARD TO LAB, STOOL - Abnormal; Notable for the following:    Fecal Occult Bld POSITIVE (*)    All other components within normal limits   Imaging Review No results found.  EKG Interpretation   None       MDM   1. BRBPR (bright red blood per rectum)    80-year-old male here with painless rectal bleed consistent with diverticular bleed. He is hemodynamically stable and his hemoglobin is within normal limits. During this is an active bleed and has had 6 episodes in the last 12 hours it seems most appropriate to monitor him inpatient.  Discussed this case with Dr. Watt Climes [facility] GI) and Dr. Rockne Menghini ([facility]) who accept the patient for admission.  Patient and family updated and they are agreeable to the plan of care.       Timmothy Euler, MD 05/04/13 [phone]

## 2013-05-05 DIAGNOSIS — K625 Hemorrhage of anus and rectum: Secondary | ICD-10-CM

## 2013-05-05 LAB — BASIC METABOLIC PANEL
BUN: 9 mg/dL (ref 6–23)
Calcium: 8.6 mg/dL (ref 8.4–10.5)
Chloride: 107 mEq/L (ref 96–112)
Creatinine, Ser: 1.09 mg/dL (ref 0.50–1.35)
GFR calc Af Amer: 74 mL/min — ABNORMAL LOW (ref >90)

## 2013-05-05 LAB — CBC
HCT: 32.1 % — ABNORMAL LOW (ref 39.0–52.0)
MCH: 29.8 pg (ref 26.0–34.0)
MCHC: 33.3 g/dL (ref 30.0–36.0)
Platelets: 195 10*3/uL (ref 150–400)
RDW: 14 % (ref 11.5–15.5)
WBC: 4.7 10*3/uL (ref 4.0–10.5)

## 2013-05-05 NOTE — Progress Notes (Signed)
Colin Larson 9:38 AM  Subjective: Patient doing well without any new complaints and just had a bowel movement which was diarrhea without any blood in it  Objective: Vital signs stable afebrile no acute distress abdomen is soft nontender slight decrease hemoglobin  Assessment: Probable diverticular bleeding  Plan: Continue clear liquids today if no further bleeding advance diet tomorrow and hopefully home soon and will need to decide how much aspirin he needs and how long he can stay off it safely from a cardiac standpoint obviously the longer the better and when he goes back to it can use a lower dose and also have him avoid nonsteroidals and use Tylenol only for pain Jullie Arps E

## 2013-05-05 NOTE — Progress Notes (Signed)
[facility] PROGRESS NOTE  Colin Larson CVE:[id number] DOB: 20-Apr-1935 DOA: 05/04/2013 PCP: Mayra Neer, MD  Assessment/Plan: Principal Problem:   Diverticulosis of colon with hemorrhage: Hemoglobin minimal drop today. Recheck tomorrow. Likely home tomorrow. Holding aspirin. Active Problems:   Other and unspecified hyperlipidemia: On patient's medical reconciliation form, he is on both Crestor and Lipitor, recommending he only be on 1 upon discharge    GERD (gastroesophageal reflux disease): Continue PPI.    CAD (coronary artery disease), native coronary artery: Need to decide if aspirin should be discontinued indefinitely it temporarily   Crohn disease: Cont Mesalamine  Code Status: DO NOT RESUSCITATE Family Communication: Left message with wife  Disposition Plan: Home likely tomorrow   Consultants:  [facility] gastroenterology  Procedures:  None  Antibiotics:  None  HPI/Subjective: Patient doing okay. Had 2 more bowel movements this morning, the first of which did have some bright red blood in it  Objective: Filed Vitals:   05/05/13 0956  BP: 165/78  Pulse:   Temp:   Resp:     Intake/Output Summary (Last 24 hours) at 05/05/13 1140 Last data filed at 05/05/13 0543  Gross per 24 hour  Intake 798.75 ml  Output      0 ml  Net 798.75 ml   Filed Weights   05/04/13 1625  Weight: 68.856 kg (151 lb 12.8 oz)    Exam:   General:  Alert and oriented x3, no acute distress  Cardiovascular: Regular rate and rhythm, S1-S2  Respiratory: Clear to auscultation bilaterally  Abdomen: Soft, nontender, nondistended, positive bowel sounds  Musculoskeletal: Clubbing or cyanosis, trace edema   Data Reviewed: Basic Metabolic Panel:  Recent Labs Lab 05/04/13 1220 05/05/13 0455  NA 141 141  K 3.8 3.8  CL 103 107  CO2 29 27  GLUCOSE 103* 85  BUN 12 9  CREATININE 1.00 1.09  CALCIUM 9.6 8.6   Liver Function Tests: No results found for this basename:  AST, ALT, ALKPHOS, BILITOT, PROT, ALBUMIN,  in the last 168 hours No results found for this basename: LIPASE, AMYLASE,  in the last 168 hours No results found for this basename: AMMONIA,  in the last 168 hours CBC:  Recent Labs Lab 05/04/13 1220 05/04/13 1948 05/05/13 0455  WBC 6.1  --  4.7  HGB 12.4* 11.1* 10.7*  HCT 38.3* 33.0* 32.1*  MCV 90.1  --  89.4  PLT 242  --  195   Cardiac Enzymes: No results found for this basename: CKTOTAL, CKMB, CKMBINDEX, TROPONINI,  in the last 168 hours BNP (last 3 results) No results found for this basename: PROBNP,  in the last 8760 hours CBG: No results found for this basename: GLUCAP,  in the last 168 hours  No results found for this or any previous visit (from the past 240 hour(s)).   Studies: No results found.  Scheduled Meds: . atorvastatin  40 mg Oral q1800  . ezetimibe  10 mg Oral Daily  . Mesalamine  800 mg Oral TID  . pantoprazole  40 mg Oral Daily  . ramipril  5 mg Oral Daily   Continuous Infusions: . sodium chloride 75 mL/hr at 05/05/13 1001    Principal Problem:   Diverticulosis of colon with hemorrhage Active Problems:   Other and unspecified hyperlipidemia   GERD (gastroesophageal reflux disease)   CAD (coronary artery disease), native coronary artery   Crohn disease    Time spent: 15 minutes    [facility] Pager [phone]. If 7PM-7AM,  please contact night-coverage at [email or web address], password [facility] 05/05/2013, 11:40 AM  LOS: 1 day

## 2013-05-05 NOTE — Progress Notes (Signed)
UR completed 

## 2013-05-06 ENCOUNTER — Encounter (HOSPITAL_COMMUNITY): Payer: Medicare Other

## 2013-05-06 DIAGNOSIS — I2510 Atherosclerotic heart disease of native coronary artery without angina pectoris: Secondary | ICD-10-CM

## 2013-05-06 MED ORDER — PANTOPRAZOLE SODIUM 40 MG PO TBEC: 40.0000 mg | DELAYED_RELEASE_TABLET | Freq: Every day | ORAL | Status: DC

## 2013-05-06 NOTE — Progress Notes (Addendum)
Colin Larson 9:47 AM  Subjective: Patient doing well without signs of bleeding and tolerating clear liquid and no new complaints  Objective: Vital signs stable afebrile no acute distress hemoglobin stable abdomen is soft nontender  Assessment: Probable diverticular bleeding  Plan: Will advance diet hopefully home soon either later today or tomorrow hold aspirin for as long as possible decreased nonsteroidals but Tylenol okay and when return to aspirin use as low a dose as possible  and please call us if we can be of any further assistance [facility]

## 2013-05-06 NOTE — Discharge Summary (Signed)
Physician Discharge Summary  Colin Larson XBM:[id number] DOB: [birth year] DOA: 05/04/2013  PCP: Mayra Neer, MD  Admit date: 05/04/2013 Discharge date: 05/06/2013  Time spent: > 35 minutes  Recommendations for Outpatient Follow-up:  1. Please be sure to follow up with your primary care physician. Pt's aspirin was held due to recent diverticular bleed. GI physician recommends holding aspirin and NSAIDs.  If considering restarting aspirin then patient is to get smallest dose possible.  Discharge Diagnoses:  Principal Problem:   Diverticulosis of colon with hemorrhage Active Problems:   Other and unspecified hyperlipidemia   GERD (gastroesophageal reflux disease)   CAD (coronary artery disease), native coronary artery   Crohn disease   Discharge Condition: Stable  Diet recommendation: low sodium heart healthy/cardiac diet  Filed Weights   05/04/13 1625 05/06/13 0545 05/06/13 0552  Weight: 68.856 kg (151 lb 12.8 oz) 67.89 kg (149 lb 10.7 oz) 68.493 kg (151 lb)    History of present illness:  Patient is a 78 y/o CM with h/o Crohn's disease and MI who presented to the ED complaining of Bleeding per rectum.  Hospital Course:  Diverticulosis of colon with hemorrhage:  - Has tolerated diet. No blood reported in BM's. - Per GI recommendations we are to hold Aspirin and NSAID's. When started back on aspirin patient should be started on lowest dose.  Have documented above for pcp review.  Active Problems:  Other and unspecified hyperlipidemia: - Will discharge on Crestor  GERD (gastroesophageal reflux disease):  - Continue PPI with protonix on discharge.  CAD (coronary artery disease), native coronary artery: Need to decide if aspirin should be discontinued indefinitely it temporarily. Will defer to PCP  Crohn disease: Cont Mesalamine, stable on discharge.   Procedures:  None  Consultations:  GI: Dr. Watt Climes  Discharge Exam: Filed Vitals:   05/06/13 0552  BP:  150/79  Pulse: 69  Temp: 97.7 F (36.5 C)  Resp: 18    General: Pt in NAD, Alert and Awake Cardiovascular: RRR, no MRG Respiratory: CTA BL, no wheezes  Discharge Instructions  Discharge Orders   Future Appointments Provider Department Dept Phone   05/23/2013 8:00 AM [place] [facility] [place] [facility] ECHO LAB [phone]   Future Orders Complete By Expires   Diet - low sodium heart healthy  As directed    Discharge instructions  As directed    Comments:     Please be sure to follow up with your primary care physician in 1-2 weeks or sooner should any new concerns arise.   Increase activity slowly  As directed        Medication List    STOP taking these medications       aspirin 325 MG tablet     atorvastatin 80 MG tablet  Commonly known as:  LIPITOR     ibuprofen 200 MG tablet  Commonly known as:  ADVIL,MOTRIN     omeprazole 20 MG capsule  Commonly known as:  PRILOSEC     potassium chloride SA 20 MEQ tablet  Commonly known as:  KLOR-CON M20      TAKE these medications       CENTRUM SILVER ULTRA MENS PO  Take 1 tablet by mouth daily.     ezetimibe 10 MG tablet  Commonly known as:  ZETIA  Take 10 mg by mouth daily.     Mesalamine 800 MG Tbec  Take 800 mg by mouth 3 (three) times daily.     nitroGLYCERIN  0.4 MG SL tablet  Commonly known as:  NITROSTAT  Place 0.4 mg under the tongue every 5 (five) minutes as needed. For chest pain     pantoprazole 40 MG tablet  Commonly known as:  PROTONIX  Take 1 tablet (40 mg total) by mouth daily.     ramipril 5 MG capsule  Commonly known as:  ALTACE  Take 5 mg by mouth daily.     rosuvastatin 20 MG tablet  Commonly known as:  CRESTOR  Take 20 mg by mouth daily.       Allergies  Allergen Reactions  . Bee Venom Swelling      The results of significant diagnostics from this hospitalization (including imaging, microbiology, ancillary and laboratory) are listed below for reference.     Significant Diagnostic Studies: No results found.  Microbiology: No results found for this or any previous visit (from the past 240 hour(s)).   Labs: Basic Metabolic Panel:  Recent Labs Lab 05/04/13 1220 05/05/13 0455  NA 141 141  K 3.8 3.8  CL 103 107  CO2 29 27  GLUCOSE 103* 85  BUN 12 9  CREATININE 1.00 1.09  CALCIUM 9.6 8.6   Liver Function Tests: No results found for this basename: AST, ALT, ALKPHOS, BILITOT, PROT, ALBUMIN,  in the last 168 hours No results found for this basename: LIPASE, AMYLASE,  in the last 168 hours No results found for this basename: AMMONIA,  in the last 168 hours CBC:  Recent Labs Lab 05/04/13 1220 05/04/13 1948 05/05/13 0455  WBC 6.1  --  4.7  HGB 12.4* 11.1* 10.7*  HCT 38.3* 33.0* 32.1*  MCV 90.1  --  89.4  PLT 242  --  195   Cardiac Enzymes: No results found for this basename: CKTOTAL, CKMB, CKMBINDEX, TROPONINI,  in the last 168 hours BNP: BNP (last 3 results) No results found for this basename: PROBNP,  in the last 8760 hours CBG: No results found for this basename: GLUCAP,  in the last 168 hours     Signed:  Velvet Bathe  [facility] 05/06/2013, 1:27 PM

## 2013-05-18 ENCOUNTER — Encounter (HOSPITAL_COMMUNITY): Payer: Self-pay | Admitting: Interventional Cardiology

## 2013-05-23 ENCOUNTER — Ambulatory Visit (HOSPITAL_COMMUNITY): Payer: Medicare Other | Attending: Cardiology

## 2013-05-23 ENCOUNTER — Encounter: Payer: Self-pay | Admitting: Cardiology

## 2013-05-23 DIAGNOSIS — E785 Hyperlipidemia, unspecified: Secondary | ICD-10-CM | POA: Insufficient documentation

## 2013-05-23 DIAGNOSIS — I658 Occlusion and stenosis of other precerebral arteries: Secondary | ICD-10-CM | POA: Insufficient documentation

## 2013-05-23 DIAGNOSIS — Z87891 Personal history of nicotine dependence: Secondary | ICD-10-CM | POA: Insufficient documentation

## 2013-05-23 DIAGNOSIS — I6529 Occlusion and stenosis of unspecified carotid artery: Secondary | ICD-10-CM | POA: Insufficient documentation

## 2013-05-23 DIAGNOSIS — I2510 Atherosclerotic heart disease of native coronary artery without angina pectoris: Secondary | ICD-10-CM | POA: Insufficient documentation

## 2013-06-13 ENCOUNTER — Encounter: Payer: Self-pay | Admitting: Cardiology

## 2013-06-13 ENCOUNTER — Other Ambulatory Visit (HOSPITAL_COMMUNITY): Payer: Self-pay | Admitting: Interventional Cardiology

## 2013-06-13 ENCOUNTER — Ambulatory Visit (HOSPITAL_COMMUNITY): Payer: Medicare Other | Attending: Cardiology | Admitting: Cardiology

## 2013-06-13 DIAGNOSIS — I252 Old myocardial infarction: Secondary | ICD-10-CM | POA: Insufficient documentation

## 2013-06-13 DIAGNOSIS — I359 Nonrheumatic aortic valve disorder, unspecified: Secondary | ICD-10-CM

## 2013-06-13 DIAGNOSIS — I2510 Atherosclerotic heart disease of native coronary artery without angina pectoris: Secondary | ICD-10-CM | POA: Insufficient documentation

## 2013-06-13 DIAGNOSIS — I079 Rheumatic tricuspid valve disease, unspecified: Secondary | ICD-10-CM | POA: Insufficient documentation

## 2013-06-13 DIAGNOSIS — I059 Rheumatic mitral valve disease, unspecified: Secondary | ICD-10-CM | POA: Insufficient documentation

## 2013-06-13 DIAGNOSIS — E785 Hyperlipidemia, unspecified: Secondary | ICD-10-CM | POA: Insufficient documentation

## 2013-06-13 DIAGNOSIS — I10 Essential (primary) hypertension: Secondary | ICD-10-CM | POA: Insufficient documentation

## 2013-06-13 NOTE — Progress Notes (Signed)
Echo performed. 

## 2013-07-04 DIAGNOSIS — H2510 Age-related nuclear cataract, unspecified eye: Secondary | ICD-10-CM | POA: Diagnosis not present

## 2013-07-04 DIAGNOSIS — H26019 Infantile and juvenile cortical, lamellar, or zonular cataract, unspecified eye: Secondary | ICD-10-CM | POA: Diagnosis not present

## 2013-07-18 DIAGNOSIS — L821 Other seborrheic keratosis: Secondary | ICD-10-CM | POA: Diagnosis not present

## 2013-07-18 DIAGNOSIS — D485 Neoplasm of uncertain behavior of skin: Secondary | ICD-10-CM | POA: Diagnosis not present

## 2013-07-18 DIAGNOSIS — Z85820 Personal history of malignant melanoma of skin: Secondary | ICD-10-CM | POA: Diagnosis not present

## 2013-07-18 DIAGNOSIS — Z85828 Personal history of other malignant neoplasm of skin: Secondary | ICD-10-CM | POA: Diagnosis not present

## 2013-07-18 DIAGNOSIS — L570 Actinic keratosis: Secondary | ICD-10-CM | POA: Diagnosis not present

## 2013-07-18 DIAGNOSIS — C44621 Squamous cell carcinoma of skin of unspecified upper limb, including shoulder: Secondary | ICD-10-CM | POA: Diagnosis not present

## 2013-07-18 DIAGNOSIS — D239 Other benign neoplasm of skin, unspecified: Secondary | ICD-10-CM | POA: Diagnosis not present

## 2013-08-09 DIAGNOSIS — L570 Actinic keratosis: Secondary | ICD-10-CM | POA: Diagnosis not present

## 2013-08-25 DIAGNOSIS — L570 Actinic keratosis: Secondary | ICD-10-CM | POA: Diagnosis not present

## 2013-08-30 DIAGNOSIS — C44621 Squamous cell carcinoma of skin of unspecified upper limb, including shoulder: Secondary | ICD-10-CM | POA: Diagnosis not present

## 2013-08-30 DIAGNOSIS — L570 Actinic keratosis: Secondary | ICD-10-CM | POA: Diagnosis not present

## 2013-08-30 DIAGNOSIS — L905 Scar conditions and fibrosis of skin: Secondary | ICD-10-CM | POA: Diagnosis not present

## 2013-08-30 DIAGNOSIS — L280 Lichen simplex chronicus: Secondary | ICD-10-CM | POA: Diagnosis not present

## 2013-09-19 ENCOUNTER — Encounter: Payer: Self-pay | Admitting: Interventional Cardiology

## 2013-09-19 DIAGNOSIS — I252 Old myocardial infarction: Secondary | ICD-10-CM | POA: Diagnosis not present

## 2013-09-19 DIAGNOSIS — I119 Hypertensive heart disease without heart failure: Secondary | ICD-10-CM | POA: Diagnosis not present

## 2013-09-19 DIAGNOSIS — I2510 Atherosclerotic heart disease of native coronary artery without angina pectoris: Secondary | ICD-10-CM | POA: Diagnosis not present

## 2013-09-19 DIAGNOSIS — I700 Atherosclerosis of aorta: Secondary | ICD-10-CM | POA: Diagnosis not present

## 2013-09-19 DIAGNOSIS — K5090 Crohn's disease, unspecified, without complications: Secondary | ICD-10-CM | POA: Diagnosis not present

## 2013-09-19 DIAGNOSIS — Z23 Encounter for immunization: Secondary | ICD-10-CM | POA: Diagnosis not present

## 2013-09-19 DIAGNOSIS — E782 Mixed hyperlipidemia: Secondary | ICD-10-CM | POA: Diagnosis not present

## 2013-09-27 DIAGNOSIS — L570 Actinic keratosis: Secondary | ICD-10-CM | POA: Diagnosis not present

## 2013-11-02 DIAGNOSIS — C439 Malignant melanoma of skin, unspecified: Secondary | ICD-10-CM | POA: Diagnosis not present

## 2013-11-02 DIAGNOSIS — Z85820 Personal history of malignant melanoma of skin: Secondary | ICD-10-CM | POA: Diagnosis not present

## 2014-01-18 DIAGNOSIS — D1800 Hemangioma unspecified site: Secondary | ICD-10-CM | POA: Diagnosis not present

## 2014-01-18 DIAGNOSIS — L570 Actinic keratosis: Secondary | ICD-10-CM | POA: Diagnosis not present

## 2014-01-18 DIAGNOSIS — L821 Other seborrheic keratosis: Secondary | ICD-10-CM | POA: Diagnosis not present

## 2014-01-18 DIAGNOSIS — Z85828 Personal history of other malignant neoplasm of skin: Secondary | ICD-10-CM | POA: Diagnosis not present

## 2014-01-18 DIAGNOSIS — Z85820 Personal history of malignant melanoma of skin: Secondary | ICD-10-CM | POA: Diagnosis not present

## 2014-01-18 DIAGNOSIS — D239 Other benign neoplasm of skin, unspecified: Secondary | ICD-10-CM | POA: Diagnosis not present

## 2014-01-18 DIAGNOSIS — D485 Neoplasm of uncertain behavior of skin: Secondary | ICD-10-CM | POA: Diagnosis not present

## 2014-01-18 DIAGNOSIS — L259 Unspecified contact dermatitis, unspecified cause: Secondary | ICD-10-CM | POA: Diagnosis not present

## 2014-01-18 DIAGNOSIS — L940 Localized scleroderma [morphea]: Secondary | ICD-10-CM | POA: Diagnosis not present

## 2014-02-18 ENCOUNTER — Emergency Department (HOSPITAL_COMMUNITY)
Admission: EM | Admit: 2014-02-18 | Discharge: 2014-02-18 | Disposition: A | Discharge status: Home / Self Care | Payer: Medicare Other | Attending: Emergency Medicine | Admitting: Emergency Medicine

## 2014-02-18 ENCOUNTER — Encounter (HOSPITAL_COMMUNITY): Payer: Self-pay | Admitting: Emergency Medicine

## 2014-02-18 ENCOUNTER — Emergency Department (HOSPITAL_COMMUNITY): Payer: Medicare Other

## 2014-02-18 DIAGNOSIS — Z79899 Other long term (current) drug therapy: Secondary | ICD-10-CM | POA: Diagnosis not present

## 2014-02-18 DIAGNOSIS — M79609 Pain in unspecified limb: Secondary | ICD-10-CM | POA: Diagnosis not present

## 2014-02-18 DIAGNOSIS — Z8719 Personal history of other diseases of the digestive system: Secondary | ICD-10-CM | POA: Insufficient documentation

## 2014-02-18 DIAGNOSIS — I2510 Atherosclerotic heart disease of native coronary artery without angina pectoris: Secondary | ICD-10-CM | POA: Diagnosis not present

## 2014-02-18 DIAGNOSIS — M25569 Pain in unspecified knee: Secondary | ICD-10-CM | POA: Insufficient documentation

## 2014-02-18 DIAGNOSIS — Z87891 Personal history of nicotine dependence: Secondary | ICD-10-CM | POA: Diagnosis not present

## 2014-02-18 DIAGNOSIS — Z85828 Personal history of other malignant neoplasm of skin: Secondary | ICD-10-CM | POA: Insufficient documentation

## 2014-02-18 DIAGNOSIS — M79604 Pain in right leg: Secondary | ICD-10-CM

## 2014-02-18 DIAGNOSIS — I252 Old myocardial infarction: Secondary | ICD-10-CM | POA: Diagnosis not present

## 2014-02-18 DIAGNOSIS — Z951 Presence of aortocoronary bypass graft: Secondary | ICD-10-CM | POA: Insufficient documentation

## 2014-02-18 DIAGNOSIS — I10 Essential (primary) hypertension: Secondary | ICD-10-CM | POA: Diagnosis not present

## 2014-02-18 DIAGNOSIS — Z8739 Personal history of other diseases of the musculoskeletal system and connective tissue: Secondary | ICD-10-CM | POA: Insufficient documentation

## 2014-02-18 DIAGNOSIS — M7989 Other specified soft tissue disorders: Secondary | ICD-10-CM | POA: Diagnosis not present

## 2014-02-18 DIAGNOSIS — IMO0002 Reserved for concepts with insufficient information to code with codable children: Secondary | ICD-10-CM | POA: Diagnosis not present

## 2014-02-18 DIAGNOSIS — E7800 Pure hypercholesterolemia, unspecified: Secondary | ICD-10-CM | POA: Diagnosis not present

## 2014-02-18 DIAGNOSIS — M1710 Unilateral primary osteoarthritis, unspecified knee: Secondary | ICD-10-CM | POA: Diagnosis not present

## 2014-02-18 NOTE — ED Notes (Signed)
Per pt, states right knee pain that started Wed-no injury

## 2014-02-18 NOTE — Progress Notes (Signed)
VASCULAR LAB PRELIMINARY  PRELIMINARY  PRELIMINARY  PRELIMINARY  Right lower extremity venous Doppler completed.    Preliminary report:  There is no DVT or SVT noted in the right lower extremity.   Brianah Hopson, RVT 02/18/2014, 6:43 PM

## 2014-02-18 NOTE — ED Notes (Signed)
Pt c/o of right calf pain. No edema noted. Some swelling around the knee noted. Good pedal pulses bilaterally.

## 2014-02-18 NOTE — ED Provider Notes (Signed)
CSN: [id number]     Arrival date & time 02/18/14  1632 History   First MD Initiated Contact with Patient 02/18/14 1642     Chief Complaint  Patient presents with  . Knee Pain     (Consider location/radiation/quality/duration/timing/severity/associated sxs/prior Treatment) HPI 79 year old male presents with 4-5 days of right calf pain. States is mostly lateral and posterior. His wife feels like his leg has been more swollen as well. This is atraumatic. He rates the pain as a 6 or 7/10 and describes as a dull ache. He's been taking Tylenol with some relief. He denies wanting any pain medicine currently in the ED. His chief complaint is originally his knee pain but he states his knee itself is not hurting. It is a little causes proximal calf. Denies weakness or numbness. He's never had a blood clot before. Denies chest pain or shortness of breath. States the pain is intermittent.  Past Medical History  Diagnosis Date  . MI (myocardial infarction) [id number]  . Crohn disease   . Hiatal hernia     no surgery  . Hypertension   . Hypercholesteremia   . Arthritis   . Skin cancer   . Coronary artery disease    Past Surgical History  Procedure Laterality Date  . Coronary stent placement  [id number]  . Cholecystectomy    . Inguinal hernia repair      x 2   Family History  Problem Relation Age of Onset  . Cancer Mother   . Stroke Father    History  Substance Use Topics  . Smoking status: Former Smoker -- 0.14 packs/day for 5 years    Quit date: 02/15/1975  . Smokeless tobacco: Not on file  . Alcohol Use: No    Review of Systems  Respiratory: Negative for shortness of breath.   Cardiovascular: Positive for leg swelling. Negative for chest pain.  Musculoskeletal: Negative for joint swelling.  All other systems reviewed and are negative.     Allergies  Bee venom  Home Medications   Prior to Admission medications   Medication Sig Start Date End Date Taking? Authorizing Provider   ezetimibe (ZETIA) 10 MG tablet Take 10 mg by mouth daily.    Historical Provider, MD  Mesalamine 800 MG TBEC Take 800 mg by mouth 3 (three) times daily.    Historical Provider, MD  Multiple Vitamins-Minerals (CENTRUM SILVER ULTRA MENS PO) Take 1 tablet by mouth daily.    Historical Provider, MD  nitroGLYCERIN (NITROSTAT) 0.4 MG SL tablet Place 0.4 mg under the tongue every 5 (five) minutes as needed. For chest pain    Historical Provider, MD  pantoprazole (PROTONIX) 40 MG tablet Take 1 tablet (40 mg total) by mouth daily. 05/06/13   Velvet Bathe, MD  ramipril (ALTACE) 5 MG capsule Take 5 mg by mouth daily.    Historical Provider, MD  rosuvastatin (CRESTOR) 20 MG tablet Take 20 mg by mouth daily.    Historical Provider, MD   BP 141/75  Pulse 69  Resp 18  SpO2 100% Physical Exam  Nursing note and vitals reviewed. Constitutional: He is oriented to person, place, and time. He appears well-developed and well-nourished.  HENT:  Head: Normocephalic and atraumatic.  Right Ear: External ear normal.  Left Ear: External ear normal.  Nose: Nose normal.  Eyes: Right eye exhibits no discharge. Left eye exhibits no discharge.  Neck: Neck supple.  Cardiovascular: Normal rate and intact distal pulses.   Pulses:      Dorsalis pedis  pulses are 2+ on the right side, and 2+ on the left side.  Pulmonary/Chest: Effort normal.  Abdominal: He exhibits no distension.  Musculoskeletal: He exhibits no edema.       Right knee: He exhibits normal range of motion, no swelling and no effusion. No tenderness found.       Right lower leg: He exhibits no tenderness and no swelling.  Normal strength and sensation in both lower extremities. No focal tenderness in the calf or focal swelling. No erythema.  Neurological: He is alert and oriented to person, place, and time.  Skin: Skin is warm and dry.    ED Course  Procedures (including critical care time) Labs Review Labs Reviewed - No data to display  Imaging  Review Dg Knee Complete 4 Views Right  02/18/2014   CLINICAL DATA:  Anterior and lateral knee pain.  No known injury.  EXAM: RIGHT KNEE - COMPLETE 4+ VIEW  COMPARISON:  None.  FINDINGS: There is no evidence of fracture or dislocation. Possible small knee joint effusion is seen. Mild osteoarthritis is seen predominately involving the patellofemoral compartment. No focal lytic or sclerotic bone lesions identified. Extensive peripheral vascular calcification noted.  IMPRESSION: Osteoarthritis mainly involving the patellofemoral compartment, and suspected small knee joint effusion.   Electronically Signed   By: Earle Gell M.D.   On: 02/18/2014 17:09     EKG Interpretation None      VASCULAR LAB  PRELIMINARY PRELIMINARY PRELIMINARY PRELIMINARY  Right lower extremity venous Doppler completed.  Preliminary report: There is no DVT or SVT noted in the right lower extremity.  KANADY, CANDACE, RVT  02/18/2014, 6:43 PM   MDM   Final diagnoses:  Right leg pain    Patient's pain is localized to right proximal calf. No knee swelling, tenderness or signs of infection/inflammation. DVT u/s shows no DVT or superficial thrombus. Patient is able to ambulate. Declines pain medicines. Will d/c home, will follow up with his orthopedics next week.    Ephraim Hamburger, MD 02/18/14 [phone]

## 2014-02-18 NOTE — Discharge Instructions (Signed)
Pain of Unknown Etiology (Pain Without a Known Cause) °You have come to your caregiver because of pain. Pain can occur in any part of the body. Often there is not a definite cause. If your laboratory (blood or urine) work was normal and X-rays or other studies were normal, your caregiver may treat you without knowing the cause of the pain. An example of this is the headache. Most headaches are diagnosed by taking a history. This means your caregiver asks you questions about your headaches. Your caregiver determines a treatment based on your answers. Usually testing done for headaches is normal. Often testing is not done unless there is no response to medications. Regardless of where your pain is located today, you can be given medications to make you comfortable. If no physical cause of pain can be found, most cases of pain will gradually leave as suddenly as they came.  °If you have a painful condition and no reason can be found for the pain, it is important that you follow up with your caregiver. If the pain becomes worse or does not go away, it may be necessary to repeat tests and look further for a possible cause. °· Only take over-the-counter or prescription medicines for pain, discomfort, or fever as directed by your caregiver. °· For the protection of your privacy, test results cannot be given over the phone. Make sure you receive the results of your test. Ask how these results are to be obtained if you have not been informed. It is your responsibility to obtain your test results. °· You may continue all activities unless the activities cause more pain. When the pain lessens, it is important to gradually resume normal activities. Resume activities by beginning slowly and gradually increasing the intensity and duration of the activities or exercise. During periods of severe pain, bed rest may be helpful. Lie or sit in any position that is comfortable. °· Ice used for acute (sudden) conditions may be effective.  Use a large plastic bag filled with ice and wrapped in a towel. This may provide pain relief. °· See your caregiver for continued problems. Your caregiver can help or refer you for exercises or physical therapy if necessary. °If you were given medications for your condition, do not drive, operate machinery or power tools, or sign legal documents for 24 hours. Do not drink alcohol, take sleeping pills, or take other medications that may interfere with treatment. °See your caregiver immediately if you have pain that is becoming worse and not relieved by medications. °Document Released: 02/25/2001 Document Revised: 03/23/2013 Document Reviewed: 06/02/2005 °[employer]® Patient Information ©2015 [employer]. This information is not intended to replace advice given to you by your health care provider. Make sure you discuss any questions you have with your health care provider. ° °

## 2014-02-18 NOTE — ED Notes (Signed)
Pt informed that he is still waiting on Doppler.

## 2014-02-21 DIAGNOSIS — M25469 Effusion, unspecified knee: Secondary | ICD-10-CM | POA: Diagnosis not present

## 2014-02-21 DIAGNOSIS — M25569 Pain in unspecified knee: Secondary | ICD-10-CM | POA: Diagnosis not present

## 2014-03-07 DIAGNOSIS — M65839 Other synovitis and tenosynovitis, unspecified forearm: Secondary | ICD-10-CM | POA: Diagnosis not present

## 2014-03-07 DIAGNOSIS — M65849 Other synovitis and tenosynovitis, unspecified hand: Secondary | ICD-10-CM | POA: Diagnosis not present

## 2014-03-07 DIAGNOSIS — M19049 Primary osteoarthritis, unspecified hand: Secondary | ICD-10-CM | POA: Diagnosis not present

## 2014-03-29 DIAGNOSIS — Z23 Encounter for immunization: Secondary | ICD-10-CM | POA: Diagnosis not present

## 2014-04-12 DIAGNOSIS — Z85820 Personal history of malignant melanoma of skin: Secondary | ICD-10-CM | POA: Diagnosis not present

## 2014-04-12 DIAGNOSIS — E782 Mixed hyperlipidemia: Secondary | ICD-10-CM | POA: Diagnosis not present

## 2014-04-12 DIAGNOSIS — K219 Gastro-esophageal reflux disease without esophagitis: Secondary | ICD-10-CM | POA: Diagnosis not present

## 2014-04-12 DIAGNOSIS — I700 Atherosclerosis of aorta: Secondary | ICD-10-CM | POA: Diagnosis not present

## 2014-04-12 DIAGNOSIS — I209 Angina pectoris, unspecified: Secondary | ICD-10-CM | POA: Diagnosis not present

## 2014-04-12 DIAGNOSIS — I119 Hypertensive heart disease without heart failure: Secondary | ICD-10-CM | POA: Diagnosis not present

## 2014-04-12 DIAGNOSIS — K5090 Crohn's disease, unspecified, without complications: Secondary | ICD-10-CM | POA: Diagnosis not present

## 2014-04-12 DIAGNOSIS — I25119 Atherosclerotic heart disease of native coronary artery with unspecified angina pectoris: Secondary | ICD-10-CM | POA: Diagnosis not present

## 2014-04-13 ENCOUNTER — Encounter: Payer: Self-pay | Admitting: Interventional Cardiology

## 2014-04-21 ENCOUNTER — Other Ambulatory Visit (HOSPITAL_COMMUNITY): Payer: Self-pay | Admitting: *Deleted

## 2014-04-21 DIAGNOSIS — L570 Actinic keratosis: Secondary | ICD-10-CM | POA: Diagnosis not present

## 2014-04-21 DIAGNOSIS — I6523 Occlusion and stenosis of bilateral carotid arteries: Secondary | ICD-10-CM

## 2014-04-24 ENCOUNTER — Ambulatory Visit (HOSPITAL_COMMUNITY): Payer: Medicare Other | Attending: Internal Medicine | Admitting: Cardiology

## 2014-04-24 DIAGNOSIS — I6523 Occlusion and stenosis of bilateral carotid arteries: Secondary | ICD-10-CM

## 2014-04-24 DIAGNOSIS — I2510 Atherosclerotic heart disease of native coronary artery without angina pectoris: Secondary | ICD-10-CM | POA: Diagnosis not present

## 2014-04-24 DIAGNOSIS — Z87891 Personal history of nicotine dependence: Secondary | ICD-10-CM | POA: Insufficient documentation

## 2014-04-24 DIAGNOSIS — E785 Hyperlipidemia, unspecified: Secondary | ICD-10-CM | POA: Diagnosis not present

## 2014-04-24 NOTE — Progress Notes (Signed)
Carotid duplex performed 

## 2014-05-03 ENCOUNTER — Encounter: Payer: Self-pay | Admitting: *Deleted

## 2014-05-23 DIAGNOSIS — K219 Gastro-esophageal reflux disease without esophagitis: Secondary | ICD-10-CM | POA: Diagnosis not present

## 2014-05-23 DIAGNOSIS — K5730 Diverticulosis of large intestine without perforation or abscess without bleeding: Secondary | ICD-10-CM | POA: Diagnosis not present

## 2014-05-23 DIAGNOSIS — K5000 Crohn's disease of small intestine without complications: Secondary | ICD-10-CM | POA: Diagnosis not present

## 2014-06-26 ENCOUNTER — Ambulatory Visit (INDEPENDENT_AMBULATORY_CARE_PROVIDER_SITE_OTHER): Payer: Medicare Other | Admitting: Interventional Cardiology

## 2014-06-26 ENCOUNTER — Encounter: Payer: Self-pay | Admitting: Interventional Cardiology

## 2014-06-26 VITALS — BP 122/80 | HR 63 | Ht 67.0 in | Wt 157.0 lb

## 2014-06-26 DIAGNOSIS — I2510 Atherosclerotic heart disease of native coronary artery without angina pectoris: Secondary | ICD-10-CM

## 2014-06-26 DIAGNOSIS — K5731 Diverticulosis of large intestine without perforation or abscess with bleeding: Secondary | ICD-10-CM

## 2014-06-26 DIAGNOSIS — E782 Mixed hyperlipidemia: Secondary | ICD-10-CM

## 2014-06-26 DIAGNOSIS — I350 Nonrheumatic aortic (valve) stenosis: Secondary | ICD-10-CM

## 2014-06-26 NOTE — Patient Instructions (Signed)
Your physician has recommended you make the following change in your medication:  1)  STOP taking Aspirin 325mg  2) START taking Aspirin 81mg  daily  Your physician wants you to follow-up in: 1 year with Dr. Irish Lack. You will receive a reminder letter in the mail two months in advance. If you don't receive a letter, please call our office to schedule the follow-up appointment.

## 2014-06-26 NOTE — Progress Notes (Signed)
Patient ID: Colin Larson, male   DOB: 1934-06-24, 80 y.o.   MRN: [id number]    [address] [place], [place], [place]  [address] Phone: [phone] Fax:  [phone]  Date:  06/26/2014   ID:  Colin Larson, DOB 31-Aug-1934, MRN [id number]  PCP:  Mayra Neer, MD      History of Present Illness: Colin Larson is a 80 y.o. male who has carotid disease and mild aortic stenosis diagnosed in 2014.   He had a prior MI in the year 2000. He was treated with bare-metal stent to the proximal in mid right coronary artery. Prior cath also  Revealed a 70% lesion in an aberrant left circumflex which arises off the right coronary cusp. He has not had any further symptoms like his prior MI. He denies using nitroglycerin or any irregular heartbeats. No trouble lying flat.   Wt Readings from Last 3 Encounters:  06/26/14 157 lb (71.215 kg)  05/06/13 151 lb (68.493 kg)  11/12/11 172 lb (78.019 kg)     Past Medical History  Diagnosis Date  . MI (myocardial infarction) [id number]  . Crohn disease   . Hiatal hernia     no surgery  . Hypertension   . Hypercholesteremia   . Arthritis   . Skin cancer   . Coronary artery disease     Current Outpatient Prescriptions  Medication Sig Dispense Refill  . aspirin EC 81 MG tablet Take 81 mg by mouth daily.    Marland Kitchen atorvastatin (LIPITOR) 40 MG tablet Take 40 mg by mouth daily.    . lansoprazole (PREVACID) 30 MG capsule Take 30 mg by mouth daily at 12 noon.    . Mesalamine 800 MG TBEC Take 800 mg by mouth 2 (two) times daily.     . Multiple Vitamin (MULTIVITAMIN WITH MINERALS) TABS tablet Take 1 tablet by mouth daily.    . ramipril (ALTACE) 5 MG capsule Take 5 mg by mouth daily.    Marland Kitchen acetaminophen (TYLENOL) 500 MG tablet Take 1,000 mg by mouth every 6 (six) hours as needed for mild pain or moderate pain.    . nitroGLYCERIN (NITROSTAT) 0.4 MG SL tablet Place 0.4 mg under the tongue every 5 (five) minutes as needed. For chest pain     No  current facility-administered medications for this visit.    Allergies:    Allergies  Allergen Reactions  . Bee Venom Swelling    Social History:  The patient  reports that he quit smoking about 39 years ago. He does not have any smokeless tobacco history on file. He reports that he does not drink alcohol or use illicit drugs.   Family History:  The patient's family history includes Cancer in his mother; Stroke in his father.   ROS:  Please see the history of present illness.  No nausea, vomiting.  No fevers, chills.  No focal weakness.  No dysuria. Balance is a little worse.  All other systems reviewed and negative.   PHYSICAL EXAM: VS:  BP 122/80 mmHg  Pulse 63  Ht 5' 7"  (1.702 m)  Wt 157 lb (71.215 kg)  BMI 24.58 kg/m2 General: Well developed, well nourished, in no acute distress HEENT: normal Neck: no JVD, no carotid bruits Cardiac:  normal S1, S2; RRR; 1/6 systolic murmur Lungs:  clear to auscultation bilaterally, no wheezing, rhonchi or rales Abd: soft, nontender, no hepatomegaly Ext: no edema Skin: warm and dry Neuro:   no focal abnormalities noted Psych: normal  affect  EKG:  NSR, inferior MI, nonspecific ST segment changes    ASSESSMENT AND PLAN:  1. Aortic stenosis:  No CP, syncope or DOE.   Mild. We discussed the symptoms associated with severe stenosis. He will let us know if any of these symptoms arise.  2. Carotid disease: q 6 month studies.  60-79% stenosis.  Study reviewed 3. GI bleed: from Crohns and diverticulosis.  Decrease aspirin dose to  81 mg daily. He was taking 325 mg daily. Avoid NSAIDs.  Tylenol for pain.   If revascularization was required in the future, would have to take this history of GI bleeding into account. 4.  CAD: No angina. Continue aggressive secondary prevention.   LDL target close to 70. Lipids checked by primary care physician, Dr. Brigitte Pulse. Continue atorvastatin.  Signed, Mina Marble, MD, [facility] 06/26/2014 10:23 AM

## 2014-06-28 DIAGNOSIS — D485 Neoplasm of uncertain behavior of skin: Secondary | ICD-10-CM | POA: Diagnosis not present

## 2014-06-28 DIAGNOSIS — L570 Actinic keratosis: Secondary | ICD-10-CM | POA: Diagnosis not present

## 2014-06-28 DIAGNOSIS — L821 Other seborrheic keratosis: Secondary | ICD-10-CM | POA: Diagnosis not present

## 2014-06-29 DIAGNOSIS — C44622 Squamous cell carcinoma of skin of right upper limb, including shoulder: Secondary | ICD-10-CM | POA: Diagnosis not present

## 2014-07-13 DIAGNOSIS — H25013 Cortical age-related cataract, bilateral: Secondary | ICD-10-CM | POA: Diagnosis not present

## 2014-07-13 DIAGNOSIS — H2513 Age-related nuclear cataract, bilateral: Secondary | ICD-10-CM | POA: Diagnosis not present

## 2014-08-03 DIAGNOSIS — D0461 Carcinoma in situ of skin of right upper limb, including shoulder: Secondary | ICD-10-CM | POA: Diagnosis not present

## 2014-10-16 DIAGNOSIS — E782 Mixed hyperlipidemia: Secondary | ICD-10-CM | POA: Diagnosis not present

## 2014-10-16 DIAGNOSIS — I119 Hypertensive heart disease without heart failure: Secondary | ICD-10-CM | POA: Diagnosis not present

## 2014-10-16 DIAGNOSIS — I25119 Atherosclerotic heart disease of native coronary artery with unspecified angina pectoris: Secondary | ICD-10-CM | POA: Diagnosis not present

## 2014-10-16 DIAGNOSIS — K5090 Crohn's disease, unspecified, without complications: Secondary | ICD-10-CM | POA: Diagnosis not present

## 2014-10-16 DIAGNOSIS — I700 Atherosclerosis of aorta: Secondary | ICD-10-CM | POA: Diagnosis not present

## 2014-10-17 ENCOUNTER — Encounter: Payer: Self-pay | Admitting: Interventional Cardiology

## 2014-10-26 DIAGNOSIS — L570 Actinic keratosis: Secondary | ICD-10-CM | POA: Diagnosis not present

## 2014-11-14 ENCOUNTER — Other Ambulatory Visit: Payer: Self-pay | Admitting: Interventional Cardiology

## 2014-11-14 DIAGNOSIS — I6523 Occlusion and stenosis of bilateral carotid arteries: Secondary | ICD-10-CM

## 2014-11-17 ENCOUNTER — Ambulatory Visit (HOSPITAL_COMMUNITY): Payer: Medicare Other | Attending: Internal Medicine

## 2014-11-17 DIAGNOSIS — I6523 Occlusion and stenosis of bilateral carotid arteries: Secondary | ICD-10-CM | POA: Diagnosis not present

## 2014-11-24 ENCOUNTER — Other Ambulatory Visit: Payer: Self-pay | Admitting: *Deleted

## 2014-11-24 ENCOUNTER — Telehealth: Payer: Self-pay | Admitting: Interventional Cardiology

## 2014-11-24 DIAGNOSIS — I6523 Occlusion and stenosis of bilateral carotid arteries: Secondary | ICD-10-CM

## 2014-11-24 NOTE — Telephone Encounter (Signed)
Returned wife's call. Pt gave permission to give wife Carotid results. Gave results to wife and pt. Both verbalized understanding.

## 2014-11-24 NOTE — Telephone Encounter (Signed)
New Message    Pt wife calling in returning a call, per Anderson Malta she said she will call pt Wife back.Marland Kitchen

## 2014-12-13 DIAGNOSIS — H01014 Ulcerative blepharitis left upper eyelid: Secondary | ICD-10-CM | POA: Diagnosis not present

## 2014-12-28 DIAGNOSIS — H01014 Ulcerative blepharitis left upper eyelid: Secondary | ICD-10-CM | POA: Diagnosis not present

## 2015-02-13 ENCOUNTER — Other Ambulatory Visit: Payer: Self-pay | Admitting: Gastroenterology

## 2015-02-13 DIAGNOSIS — Z85820 Personal history of malignant melanoma of skin: Secondary | ICD-10-CM | POA: Diagnosis not present

## 2015-02-13 DIAGNOSIS — R634 Abnormal weight loss: Secondary | ICD-10-CM | POA: Diagnosis not present

## 2015-02-13 DIAGNOSIS — K5090 Crohn's disease, unspecified, without complications: Secondary | ICD-10-CM | POA: Diagnosis not present

## 2015-02-13 DIAGNOSIS — I25119 Atherosclerotic heart disease of native coronary artery with unspecified angina pectoris: Secondary | ICD-10-CM | POA: Diagnosis not present

## 2015-02-22 DIAGNOSIS — L570 Actinic keratosis: Secondary | ICD-10-CM | POA: Diagnosis not present

## 2015-02-22 DIAGNOSIS — Z85820 Personal history of malignant melanoma of skin: Secondary | ICD-10-CM | POA: Diagnosis not present

## 2015-02-22 DIAGNOSIS — L821 Other seborrheic keratosis: Secondary | ICD-10-CM | POA: Diagnosis not present

## 2015-02-22 DIAGNOSIS — D225 Melanocytic nevi of trunk: Secondary | ICD-10-CM | POA: Diagnosis not present

## 2015-02-22 DIAGNOSIS — C44319 Basal cell carcinoma of skin of other parts of face: Secondary | ICD-10-CM | POA: Diagnosis not present

## 2015-02-22 DIAGNOSIS — D485 Neoplasm of uncertain behavior of skin: Secondary | ICD-10-CM | POA: Diagnosis not present

## 2015-02-22 DIAGNOSIS — M25561 Pain in right knee: Secondary | ICD-10-CM | POA: Diagnosis not present

## 2015-02-22 DIAGNOSIS — Z85828 Personal history of other malignant neoplasm of skin: Secondary | ICD-10-CM | POA: Diagnosis not present

## 2015-02-27 ENCOUNTER — Ambulatory Visit
Admission: RE | Admit: 2015-02-27 | Discharge: 2015-02-27 | Disposition: A | Discharge status: Home / Self Care | Payer: Medicare Other | Source: Ambulatory Visit | Attending: Gastroenterology | Admitting: Gastroenterology

## 2015-02-27 ENCOUNTER — Other Ambulatory Visit: Payer: Self-pay | Admitting: Gastroenterology

## 2015-02-27 DIAGNOSIS — K5090 Crohn's disease, unspecified, without complications: Secondary | ICD-10-CM

## 2015-02-27 MED ORDER — IOPAMIDOL (ISOVUE-300) INJECTION 61%
100.0000 mL | Freq: Once | INTRAVENOUS | Status: AC | PRN
  Administered 2015-02-27: 100 mL via INTRAVENOUS

## 2015-03-15 DIAGNOSIS — C44319 Basal cell carcinoma of skin of other parts of face: Secondary | ICD-10-CM | POA: Diagnosis not present

## 2015-03-21 DIAGNOSIS — Z85828 Personal history of other malignant neoplasm of skin: Secondary | ICD-10-CM | POA: Diagnosis not present

## 2015-03-21 DIAGNOSIS — L570 Actinic keratosis: Secondary | ICD-10-CM | POA: Diagnosis not present

## 2015-03-26 DIAGNOSIS — K635 Polyp of colon: Secondary | ICD-10-CM | POA: Diagnosis not present

## 2015-03-26 DIAGNOSIS — K633 Ulcer of intestine: Secondary | ICD-10-CM | POA: Diagnosis not present

## 2015-03-26 DIAGNOSIS — Z8719 Personal history of other diseases of the digestive system: Secondary | ICD-10-CM | POA: Diagnosis not present

## 2015-03-26 DIAGNOSIS — D124 Benign neoplasm of descending colon: Secondary | ICD-10-CM | POA: Diagnosis not present

## 2015-03-26 DIAGNOSIS — Z09 Encounter for follow-up examination after completed treatment for conditions other than malignant neoplasm: Secondary | ICD-10-CM | POA: Diagnosis not present

## 2015-04-09 DIAGNOSIS — M19041 Primary osteoarthritis, right hand: Secondary | ICD-10-CM | POA: Diagnosis not present

## 2015-04-26 DIAGNOSIS — K219 Gastro-esophageal reflux disease without esophagitis: Secondary | ICD-10-CM | POA: Diagnosis not present

## 2015-04-26 DIAGNOSIS — R935 Abnormal findings on diagnostic imaging of other abdominal regions, including retroperitoneum: Secondary | ICD-10-CM | POA: Diagnosis not present

## 2015-04-26 DIAGNOSIS — K5090 Crohn's disease, unspecified, without complications: Secondary | ICD-10-CM | POA: Diagnosis not present

## 2015-04-30 DIAGNOSIS — I119 Hypertensive heart disease without heart failure: Secondary | ICD-10-CM | POA: Diagnosis not present

## 2015-04-30 DIAGNOSIS — E782 Mixed hyperlipidemia: Secondary | ICD-10-CM | POA: Diagnosis not present

## 2015-04-30 DIAGNOSIS — K5090 Crohn's disease, unspecified, without complications: Secondary | ICD-10-CM | POA: Diagnosis not present

## 2015-04-30 DIAGNOSIS — Z0000 Encounter for general adult medical examination without abnormal findings: Secondary | ICD-10-CM | POA: Diagnosis not present

## 2015-04-30 DIAGNOSIS — I209 Angina pectoris, unspecified: Secondary | ICD-10-CM | POA: Diagnosis not present

## 2015-04-30 DIAGNOSIS — Z85820 Personal history of malignant melanoma of skin: Secondary | ICD-10-CM | POA: Diagnosis not present

## 2015-04-30 DIAGNOSIS — Z23 Encounter for immunization: Secondary | ICD-10-CM | POA: Diagnosis not present

## 2015-04-30 DIAGNOSIS — I700 Atherosclerosis of aorta: Secondary | ICD-10-CM | POA: Diagnosis not present

## 2015-04-30 DIAGNOSIS — K219 Gastro-esophageal reflux disease without esophagitis: Secondary | ICD-10-CM | POA: Diagnosis not present

## 2015-04-30 DIAGNOSIS — I25119 Atherosclerotic heart disease of native coronary artery with unspecified angina pectoris: Secondary | ICD-10-CM | POA: Diagnosis not present

## 2015-05-01 ENCOUNTER — Encounter: Payer: Self-pay | Admitting: Interventional Cardiology

## 2015-06-06 ENCOUNTER — Other Ambulatory Visit: Payer: Self-pay | Admitting: Interventional Cardiology

## 2015-06-06 DIAGNOSIS — I6523 Occlusion and stenosis of bilateral carotid arteries: Secondary | ICD-10-CM

## 2015-06-26 ENCOUNTER — Inpatient Hospital Stay (HOSPITAL_COMMUNITY): Admission: RE | Admit: 2015-06-26 | Payer: Medicare Other | Source: Ambulatory Visit

## 2015-06-27 DIAGNOSIS — L570 Actinic keratosis: Secondary | ICD-10-CM | POA: Diagnosis not present

## 2015-06-27 DIAGNOSIS — Z23 Encounter for immunization: Secondary | ICD-10-CM | POA: Diagnosis not present

## 2015-06-27 DIAGNOSIS — D485 Neoplasm of uncertain behavior of skin: Secondary | ICD-10-CM | POA: Diagnosis not present

## 2015-06-27 DIAGNOSIS — C434 Malignant melanoma of scalp and neck: Secondary | ICD-10-CM | POA: Diagnosis not present

## 2015-06-28 ENCOUNTER — Ambulatory Visit (HOSPITAL_COMMUNITY)
Admission: RE | Admit: 2015-06-28 | Discharge: 2015-06-28 | Disposition: A | Discharge status: Home / Self Care | Payer: Medicare Other | Source: Ambulatory Visit | Attending: Cardiology | Admitting: Cardiology

## 2015-06-28 DIAGNOSIS — I6523 Occlusion and stenosis of bilateral carotid arteries: Secondary | ICD-10-CM | POA: Diagnosis present

## 2015-06-28 DIAGNOSIS — E7800 Pure hypercholesterolemia, unspecified: Secondary | ICD-10-CM | POA: Diagnosis not present

## 2015-06-28 DIAGNOSIS — I10 Essential (primary) hypertension: Secondary | ICD-10-CM | POA: Diagnosis not present

## 2015-07-05 DIAGNOSIS — C434 Malignant melanoma of scalp and neck: Secondary | ICD-10-CM | POA: Diagnosis not present

## 2015-07-05 DIAGNOSIS — D034 Melanoma in situ of scalp and neck: Secondary | ICD-10-CM | POA: Diagnosis not present

## 2015-08-22 ENCOUNTER — Other Ambulatory Visit: Payer: Self-pay | Admitting: Gastroenterology

## 2015-08-22 DIAGNOSIS — R1310 Dysphagia, unspecified: Secondary | ICD-10-CM | POA: Diagnosis not present

## 2015-08-22 DIAGNOSIS — K219 Gastro-esophageal reflux disease without esophagitis: Secondary | ICD-10-CM | POA: Diagnosis not present

## 2015-08-29 ENCOUNTER — Ambulatory Visit
Admission: RE | Admit: 2015-08-29 | Discharge: 2015-08-29 | Disposition: A | Discharge status: Home / Self Care | Payer: Medicare Other | Source: Ambulatory Visit | Attending: Gastroenterology | Admitting: Gastroenterology

## 2015-08-29 DIAGNOSIS — K219 Gastro-esophageal reflux disease without esophagitis: Secondary | ICD-10-CM | POA: Diagnosis not present

## 2015-08-29 DIAGNOSIS — R1310 Dysphagia, unspecified: Secondary | ICD-10-CM | POA: Diagnosis not present

## 2015-08-29 DIAGNOSIS — K449 Diaphragmatic hernia without obstruction or gangrene: Secondary | ICD-10-CM | POA: Diagnosis not present

## 2015-10-11 DIAGNOSIS — H2513 Age-related nuclear cataract, bilateral: Secondary | ICD-10-CM | POA: Diagnosis not present

## 2015-10-11 DIAGNOSIS — H25013 Cortical age-related cataract, bilateral: Secondary | ICD-10-CM | POA: Diagnosis not present

## 2015-10-24 DIAGNOSIS — D225 Melanocytic nevi of trunk: Secondary | ICD-10-CM | POA: Diagnosis not present

## 2015-10-24 DIAGNOSIS — L98499 Non-pressure chronic ulcer of skin of other sites with unspecified severity: Secondary | ICD-10-CM | POA: Diagnosis not present

## 2015-10-24 DIAGNOSIS — Z85828 Personal history of other malignant neoplasm of skin: Secondary | ICD-10-CM | POA: Diagnosis not present

## 2015-10-24 DIAGNOSIS — D485 Neoplasm of uncertain behavior of skin: Secondary | ICD-10-CM | POA: Diagnosis not present

## 2015-10-24 DIAGNOSIS — Z85820 Personal history of malignant melanoma of skin: Secondary | ICD-10-CM | POA: Diagnosis not present

## 2015-10-24 DIAGNOSIS — L821 Other seborrheic keratosis: Secondary | ICD-10-CM | POA: Diagnosis not present

## 2015-10-24 DIAGNOSIS — L570 Actinic keratosis: Secondary | ICD-10-CM | POA: Diagnosis not present

## 2015-11-06 DIAGNOSIS — E782 Mixed hyperlipidemia: Secondary | ICD-10-CM | POA: Diagnosis not present

## 2015-11-06 DIAGNOSIS — H6120 Impacted cerumen, unspecified ear: Secondary | ICD-10-CM | POA: Diagnosis not present

## 2015-11-06 DIAGNOSIS — I700 Atherosclerosis of aorta: Secondary | ICD-10-CM | POA: Diagnosis not present

## 2015-11-06 DIAGNOSIS — K5090 Crohn's disease, unspecified, without complications: Secondary | ICD-10-CM | POA: Diagnosis not present

## 2015-11-06 DIAGNOSIS — I119 Hypertensive heart disease without heart failure: Secondary | ICD-10-CM | POA: Diagnosis not present

## 2015-11-06 DIAGNOSIS — I25119 Atherosclerotic heart disease of native coronary artery with unspecified angina pectoris: Secondary | ICD-10-CM | POA: Diagnosis not present

## 2015-11-06 DIAGNOSIS — R634 Abnormal weight loss: Secondary | ICD-10-CM | POA: Diagnosis not present

## 2015-11-06 DIAGNOSIS — I209 Angina pectoris, unspecified: Secondary | ICD-10-CM | POA: Diagnosis not present

## 2016-04-01 DIAGNOSIS — L570 Actinic keratosis: Secondary | ICD-10-CM | POA: Diagnosis not present

## 2016-04-01 DIAGNOSIS — Z85828 Personal history of other malignant neoplasm of skin: Secondary | ICD-10-CM | POA: Diagnosis not present

## 2016-04-01 DIAGNOSIS — Q828 Other specified congenital malformations of skin: Secondary | ICD-10-CM | POA: Diagnosis not present

## 2016-04-01 DIAGNOSIS — Z23 Encounter for immunization: Secondary | ICD-10-CM | POA: Diagnosis not present

## 2016-04-01 DIAGNOSIS — D2272 Melanocytic nevi of left lower limb, including hip: Secondary | ICD-10-CM | POA: Diagnosis not present

## 2016-04-01 DIAGNOSIS — D485 Neoplasm of uncertain behavior of skin: Secondary | ICD-10-CM | POA: Diagnosis not present

## 2016-04-01 DIAGNOSIS — Z85820 Personal history of malignant melanoma of skin: Secondary | ICD-10-CM | POA: Diagnosis not present

## 2016-06-30 ENCOUNTER — Telehealth: Payer: Self-pay | Admitting: Interventional Cardiology

## 2016-06-30 NOTE — Telephone Encounter (Signed)
Colin Larson is advised that the pt is due for a f/u and that his Echo can be scheduled at that time. I offered several appts for the pt to see Dr Irish Lack but the pt could not accept those as he takes his grandchild to and from school and the appts were around the times that he would be either taking his grandchild or picking them up from school. He accepted an appt with Melina Copa, PA-c on 1/18 at 3:30.

## 2016-06-30 NOTE — Telephone Encounter (Signed)
New message      Wife is calling to see if pt is due to have another carotid ultrasound?  There is no order.  Please call

## 2016-07-02 ENCOUNTER — Encounter: Payer: Self-pay | Admitting: Physician Assistant

## 2016-07-02 DIAGNOSIS — I350 Nonrheumatic aortic (valve) stenosis: Secondary | ICD-10-CM | POA: Insufficient documentation

## 2016-07-02 DIAGNOSIS — I340 Nonrheumatic mitral (valve) insufficiency: Secondary | ICD-10-CM | POA: Insufficient documentation

## 2016-07-02 DIAGNOSIS — I739 Peripheral vascular disease, unspecified: Secondary | ICD-10-CM

## 2016-07-02 DIAGNOSIS — I779 Disorder of arteries and arterioles, unspecified: Secondary | ICD-10-CM | POA: Insufficient documentation

## 2016-07-02 DIAGNOSIS — K922 Gastrointestinal hemorrhage, unspecified: Secondary | ICD-10-CM | POA: Insufficient documentation

## 2016-07-02 NOTE — Progress Notes (Deleted)
Cardiology Office Note    Date:  07/02/2016  ID:  Colin Larson, DOB 1935/06/10, MRN [id number] PCP:  Colin Neer, MD  Cardiologist:  Dr. Irish Lack   Chief Complaint: f/u heart and valve disease  History of Present Illness:  Colin Larson is a 82 y.o. male with history of CAD (MI 2000 s/p BMS to prox/mid RCA, 70% lesion in abberant LCx that arises from right coronary cusp per Dr. Hassell Done note), carotid disease, mild AS, HTN, HLD, arthritis, Crohn's disease, hiatal hernia, GIB (from diverticulosis/Crohn's 2014), melanoma of the skin who presents for yearly routine follow-up. Last echo 2014: mild LVH, EF 60-65%, mild AS, mild MR. Carotid duplex 06/2015: 00-93% RICA, 8-18% LICA, >29% RECA. Labs 2016: CMET unremarkable except Na 135 (Cr 1.06, LFTs wnl), LDL 64, trig 114. Prior CBC in 2014 showed Hgb 10.4.   f/u AS, carotids labs  CAD Essential HTN Hyperlipidemia Mild aortic stenosis H/o anemia Carotid artery disease    Past Medical History:  Diagnosis Date  . Anemia   . Arthritis   . Carotid artery disease ([place])   . Coronary artery disease    a. MI 2000 s/p BMS to prox/mid RCA, 70% lesion in abberant LCx that arises from right coronary cusp per Dr. Hassell Done note  . Crohn disease ([place])   . GI bleeding    2014 - from diverticulosis/Crohn's 2014  . Hiatal hernia    no surgery  . Hypercholesteremia   . Hypertension   . MI (myocardial infarction) [id number]  . Mild aortic stenosis   . Mild mitral regurgitation   . Skin cancer     Past Surgical History:  Procedure Laterality Date  . CHOLECYSTECTOMY    . CORONARY STENT PLACEMENT  [id number]  . INGUINAL HERNIA REPAIR     x 2    Current Medications: Current Outpatient Prescriptions  Medication Sig Dispense Refill  . acetaminophen (TYLENOL) 500 MG tablet Take 1,000 mg by mouth every 6 (six) hours as needed for mild pain or moderate pain.    Marland Kitchen aspirin EC 81 MG tablet Take 81 mg by mouth daily.    Marland Kitchen atorvastatin  (LIPITOR) 40 MG tablet Take 40 mg by mouth daily.    . lansoprazole (PREVACID) 30 MG capsule Take 30 mg by mouth daily at 12 noon.    . Mesalamine 800 MG TBEC Take 800 mg by mouth 2 (two) times daily.     . Multiple Vitamin (MULTIVITAMIN WITH MINERALS) TABS tablet Take 1 tablet by mouth daily.    . nitroGLYCERIN (NITROSTAT) 0.4 MG SL tablet Place 0.4 mg under the tongue every 5 (five) minutes as needed. For chest pain    . ramipril (ALTACE) 5 MG capsule Take 5 mg by mouth daily.     No current facility-administered medications for this visit.      Allergies:   Bee venom   Social History   Social History  . Marital status: Married    Spouse name: N/A  . Number of children: N/A  . Years of education: N/A   Social History Main Topics  . Smoking status: Former Smoker    Packs/day: 0.14    Years: 5.00    Quit date: 02/15/1975  . Smokeless tobacco: Not on file  . Alcohol use No  . Drug use: No  . Sexual activity: Not on file   Other Topics Concern  . Not on file   Social History Narrative  . No narrative on file  Family History:  The patient's family history includes Cancer in his mother; Stroke in his father. ***  ROS:   Please see the history of present illness. Otherwise, review of systems is positive for ***.  All other systems are reviewed and otherwise negative.    PHYSICAL EXAM:   VS:  There were no vitals taken for this visit.  BMI: There is no height or weight on file to calculate BMI. GEN: Well nourished, well developed, in no acute distress  HEENT: normocephalic, atraumatic Neck: no JVD, carotid bruits, or masses Cardiac: ***RRR; no murmurs, rubs, or gallops, no edema  Respiratory:  clear to auscultation bilaterally, normal work of breathing GI: soft, nontender, nondistended, + BS MS: no deformity or atrophy  Skin: warm and dry, no rash Neuro:  Alert and Oriented x 3, Strength and sensation are intact, follows commands Psych: euthymic mood, full  affect  Wt Readings from Last 3 Encounters:  06/26/14 157 lb (71.2 kg)  05/06/13 151 lb (68.5 kg)  11/12/11 172 lb (78 kg)      Studies/Labs Reviewed:   EKG:  EKG was ordered today and personally reviewed by me and demonstrates *** EKG was not ordered today.***  Recent Labs: No results found for requested labs within last 8760 hours.   Lipid Panel No results found for: CHOL, TRIG, HDL, CHOLHDL, VLDL, LDLCALC, LDLDIRECT  Additional studies/ records that were reviewed today include: Summarized above.***    ASSESSMENT & PLAN:   1. ***  Disposition: F/u with ***   Medication Adjustments/Labs and Tests Ordered: Current medicines are reviewed at length with the patient today.  Concerns regarding medicines are outlined above. Medication changes, Labs and Tests ordered today are summarized above and listed in the Patient Instructions accessible in Encounters.   Raechel Ache PA-C  07/02/2016 1:23 PM    [facility] [address], [place], [place]  [address] Phone: [phone]; Fax: [phone]

## 2016-07-03 ENCOUNTER — Ambulatory Visit: Payer: Medicare Other | Admitting: Physician Assistant

## 2016-07-18 ENCOUNTER — Ambulatory Visit: Payer: Medicare Other | Admitting: Physician Assistant

## 2016-07-21 ENCOUNTER — Other Ambulatory Visit (HOSPITAL_COMMUNITY): Payer: Self-pay | Admitting: Family Medicine

## 2016-07-21 ENCOUNTER — Other Ambulatory Visit: Payer: Self-pay | Admitting: Interventional Cardiology

## 2016-07-21 DIAGNOSIS — I6523 Occlusion and stenosis of bilateral carotid arteries: Secondary | ICD-10-CM

## 2016-07-25 ENCOUNTER — Ambulatory Visit (HOSPITAL_COMMUNITY)
Admission: RE | Admit: 2016-07-25 | Discharge: 2016-07-25 | Disposition: A | Discharge status: Home / Self Care | Payer: Medicare Other | Source: Ambulatory Visit | Attending: Cardiovascular Disease | Admitting: Cardiovascular Disease

## 2016-07-25 DIAGNOSIS — K219 Gastro-esophageal reflux disease without esophagitis: Secondary | ICD-10-CM | POA: Diagnosis not present

## 2016-07-25 DIAGNOSIS — Z0000 Encounter for general adult medical examination without abnormal findings: Secondary | ICD-10-CM | POA: Diagnosis not present

## 2016-07-25 DIAGNOSIS — Z85820 Personal history of malignant melanoma of skin: Secondary | ICD-10-CM | POA: Diagnosis not present

## 2016-07-25 DIAGNOSIS — I6523 Occlusion and stenosis of bilateral carotid arteries: Secondary | ICD-10-CM | POA: Insufficient documentation

## 2016-07-25 DIAGNOSIS — I25119 Atherosclerotic heart disease of native coronary artery with unspecified angina pectoris: Secondary | ICD-10-CM | POA: Diagnosis not present

## 2016-07-25 DIAGNOSIS — K5090 Crohn's disease, unspecified, without complications: Secondary | ICD-10-CM | POA: Diagnosis not present

## 2016-07-25 DIAGNOSIS — E782 Mixed hyperlipidemia: Secondary | ICD-10-CM | POA: Diagnosis not present

## 2016-07-25 DIAGNOSIS — I119 Hypertensive heart disease without heart failure: Secondary | ICD-10-CM | POA: Diagnosis not present

## 2016-07-25 DIAGNOSIS — I700 Atherosclerosis of aorta: Secondary | ICD-10-CM | POA: Diagnosis not present

## 2016-07-30 DIAGNOSIS — Z862 Personal history of diseases of the blood and blood-forming organs and certain disorders involving the immune mechanism: Secondary | ICD-10-CM | POA: Insufficient documentation

## 2016-07-30 NOTE — Progress Notes (Signed)
Cardiology Office Note    Date:  07/31/2016  ID:  [name], DOB October 12, 1934, MRN [id number] PCP:  Mayra Neer, MD  Cardiologist:  Irish Lack   Chief Complaint: f/u CAD  History of Present Illness:  Colin Larson is a 82 y.o. male with history of CAD (MI 2000 s/p BMS to prox/mid RCA, 70% lesion in abberant LCx that arises from right coronary cusp per Dr. Hassell Done note), carotid disease, mild AS, HTN, HLD, arthritis, Crohn's disease, hiatal hernia, GIB (from diverticulosis/Crohn's 2014), melanoma of the skin who presents for yearly routine follow-up. Last echo 2014: mild LVH, EF 60-65%, mild AS, mild MR. Carotid duplex 07/2016: 67-12% RICA, 4-58% LICA, >09% RECA. Labs 2016: CMET unremarkable except Na 135 (Cr 1.06, LFTs wnl), LDL 64, trig 114. Prior CBC in 2014 showed Hgb 10.4.  He presents back to clinic with his wife. Overall he is doing well. He continues to walk on an indoor track at church 3-4 days a week, about 2 miles each time over an hour. He denies any anginal chest pain, dypnea on exertion, palpitations or syncope. He's not had any bleeding. He reports his PCP did labwork recently and he was told everything looked OK except good cholesterol is low like it usually is. He does report an 11lb weight loss over the last year unintentionally. He attributes this to his Crohn's disease but PCP is keeping an eye on it.   Past Medical History:  Diagnosis Date  . Anemia   . Arthritis   . Carotid artery disease ([place])   . Coronary artery disease    a. MI 2000 s/p BMS to prox/mid RCA, 70% lesion in abberant LCx that arises from right coronary cusp per Dr. Hassell Done note  . Crohn disease ([place])   . GI bleeding    2014 - from diverticulosis/Crohn's 2014  . Hiatal hernia    no surgery  . Hypercholesteremia   . Hypertension   . MI (myocardial infarction) [id number]  . Mild aortic stenosis   . Mild mitral regurgitation   . Skin cancer     Past Surgical History:  Procedure  Laterality Date  . CHOLECYSTECTOMY    . CORONARY STENT PLACEMENT  [id number]  . INGUINAL HERNIA REPAIR     x 2    Current Medications: Current Outpatient Prescriptions  Medication Sig Dispense Refill  . acetaminophen (TYLENOL) 500 MG tablet Take 1,000 mg by mouth every 6 (six) hours as needed for mild pain or moderate pain.    Marland Kitchen aspirin EC 81 MG tablet Take 81 mg by mouth daily.    Marland Kitchen atorvastatin (LIPITOR) 40 MG tablet Take 40 mg by mouth daily.    . lansoprazole (PREVACID) 30 MG capsule Take 30 mg by mouth daily at 12 noon.    . Multiple Vitamin (MULTIVITAMIN WITH MINERALS) TABS tablet Take 1 tablet by mouth daily.    . nitroGLYCERIN (NITROSTAT) 0.4 MG SL tablet Place 0.4 mg under the tongue every 5 (five) minutes as needed. For chest pain    . ramipril (ALTACE) 5 MG capsule Take 5 mg by mouth daily.     No current facility-administered medications for this visit.      Allergies:   Bee venom   Social History   Social History  . Marital status: Married    Spouse name: N/A  . Number of children: N/A  . Years of education: N/A   Social History Main Topics  . Smoking status: Former Smoker    Packs/day:  0.14    Years: 5.00    Quit date: 02/15/1975  . Smokeless tobacco: Never Used  . Alcohol use No  . Drug use: No  . Sexual activity: Not Asked   Other Topics Concern  . None   Social History Narrative  . None     Family History:  The patient's family history includes Cancer in his mother; Stroke in his father.   ROS:   Please see the history of present illness.  All other systems are reviewed and otherwise negative.    PHYSICAL EXAM:   VS:  BP 132/68   Pulse (!) 53   Ht 5' 7"  (1.702 m)   Wt 146 lb (66.2 kg)   BMI 22.87 kg/m   BMI: Body mass index is 22.87 kg/m. GEN: Well nourished, well developed WM, in no acute distress  HEENT: normocephalic, atraumatic. Hearing aids in place. Neck: no JVD, carotid bruits, or masses Cardiac: RRR, somewhat distant heart sounds,  soft SEM with preserved S2, no rubs or gallops, no edema  Respiratory:  clear to auscultation bilaterally, normal work of breathing GI: soft, nontender, nondistended, + BS MS: no deformity or atrophy  Skin: warm and dry, no rash Neuro:  Alert and Oriented x 3, Strength and sensation are intact, follows commands Psych: euthymic mood, full affect  Wt Readings from Last 3 Encounters:  07/31/16 146 lb (66.2 kg)  06/26/14 157 lb (71.2 kg)  05/06/13 151 lb (68.5 kg)      Studies/Labs Reviewed:   EKG:  EKG was ordered today and personally reviewed by me and demonstrates SB 53bpm, LVH, prior inferior infarct, TWI III, avF, V6  Recent Labs: No results found for requested labs within last 8760 hours.   Lipid Panel No results found for: CHOL, TRIG, HDL, CHOLHDL, VLDL, LDLCALC, LDLDIRECT  Additional studies/ records that were reviewed today include: Summarized above.    ASSESSMENT & PLAN:   1. CAD - no recent anginal symptoms. Continue ASA, statin. He is not on a BB presumably due to baseline HR 50s. Encouraged continued regular physical activity. 2. Essential HTN - stable, continue current regimen. 3. Hyperlipidemia - being followed by primary care. Continue moderate intensity statin. Will try to get a copy of recent labs from primary care. 4. Mild aortic stenosis - discussed surveillance echo versus observation for symptoms with patient. At this time he would like to proceed with echo to ensure stability. I do believe that him remaining active will allow [place] to closely monitor this clinically thereafter.  5. H/o anemia - being monitored by primary care. He denies any bleeding. 6. Carotid artery disease - f/u study in 1 year.  Disposition: F/u with Dr. Irish Lack in 1 year. His PCP is monitoring his recent weight loss.    Medication Adjustments/Labs and Tests Ordered: Current medicines are reviewed at length with the patient today.  Concerns regarding medicines are outlined above.  Medication changes, Labs and Tests ordered today are summarized above and listed in the Patient Instructions accessible in Encounters.   Raechel Ache PA-C  07/31/2016 1:50 PM    [facility] [place], [place], [place]  [address] Phone: [phone]; Fax: [phone]

## 2016-07-31 ENCOUNTER — Encounter: Payer: Self-pay | Admitting: Physician Assistant

## 2016-07-31 ENCOUNTER — Ambulatory Visit (INDEPENDENT_AMBULATORY_CARE_PROVIDER_SITE_OTHER): Payer: Medicare Other | Admitting: Physician Assistant

## 2016-07-31 VITALS — BP 132/68 | HR 53 | Ht 67.0 in | Wt 146.0 lb

## 2016-07-31 DIAGNOSIS — Z862 Personal history of diseases of the blood and blood-forming organs and certain disorders involving the immune mechanism: Secondary | ICD-10-CM

## 2016-07-31 DIAGNOSIS — I2510 Atherosclerotic heart disease of native coronary artery without angina pectoris: Secondary | ICD-10-CM

## 2016-07-31 DIAGNOSIS — I739 Peripheral vascular disease, unspecified: Secondary | ICD-10-CM

## 2016-07-31 DIAGNOSIS — I10 Essential (primary) hypertension: Secondary | ICD-10-CM

## 2016-07-31 DIAGNOSIS — E785 Hyperlipidemia, unspecified: Secondary | ICD-10-CM | POA: Diagnosis not present

## 2016-07-31 DIAGNOSIS — I350 Nonrheumatic aortic (valve) stenosis: Secondary | ICD-10-CM | POA: Diagnosis not present

## 2016-07-31 DIAGNOSIS — I779 Disorder of arteries and arterioles, unspecified: Secondary | ICD-10-CM

## 2016-07-31 DIAGNOSIS — I6523 Occlusion and stenosis of bilateral carotid arteries: Secondary | ICD-10-CM

## 2016-07-31 MED ORDER — NITROGLYCERIN 0.4 MG SL SUBL: 0.4000 mg | SUBLINGUAL_TABLET | SUBLINGUAL | 3 refills | Status: DC | PRN

## 2016-07-31 NOTE — Patient Instructions (Signed)
Medication Instructions:  Your physician recommends that you continue on your current medications as directed. Please refer to the Current Medication list given to you today.   Labwork: None ordered  Testing/Procedures: Your physician has requested that you have an echocardiogram. Echocardiography is a painless test that uses sound waves to create images of your heart. It provides your doctor with information about the size and shape of your heart and how well your heart's chambers and valves are working. This procedure takes approximately one hour. There are no restrictions for this procedure.    Follow-Up: Your physician wants you to follow-up in: Colin Larson will receive a reminder letter in the mail two months in advance. If you don't receive a letter, please call our office to schedule the follow-up appointment.   Any Other Special Instructions Will Be Listed Below (If Applicable).   Echocardiogram An echocardiogram, or echocardiography, uses sound waves (ultrasound) to produce an image of your heart. The echocardiogram is simple, painless, obtained within a short period of time, and offers valuable information to your health care provider. The images from an echocardiogram can provide information such as:  Evidence of coronary artery disease (CAD).  Heart size.  Heart muscle function.  Heart valve function.  Aneurysm detection.  Evidence of a past heart attack.  Fluid buildup around the heart.  Heart muscle thickening.  Assess heart valve function. Tell a health care provider about:  Any allergies you have.  All medicines you are taking, including vitamins, herbs, eye drops, creams, and over-the-counter medicines.  Any problems you or family members have had with anesthetic medicines.  Any blood disorders you have.  Any surgeries you have had.  Any medical conditions you have.  Whether you are pregnant or may be pregnant. What happens before  the procedure? No special preparation is needed. Eat and drink normally. What happens during the procedure?  In order to produce an image of your heart, gel will be applied to your chest and a wand-like tool (transducer) will be moved over your chest. The gel will help transmit the sound waves from the transducer. The sound waves will harmlessly bounce off your heart to allow the heart images to be captured in real-time motion. These images will then be recorded.  You may need an IV to receive a medicine that improves the quality of the pictures. What happens after the procedure? You may return to your normal schedule including diet, activities, and medicines, unless your health care provider tells you otherwise. This information is not intended to replace advice given to you by your health care provider. Make sure you discuss any questions you have with your health care provider. Document Released: 05/30/2000 Document Revised: 01/19/2016 Document Reviewed: 02/07/2013 Elsevier Interactive Patient Education  2017 [employer].   If you need a refill on your cardiac medications before your next appointment, please call your pharmacy.

## 2016-08-14 DIAGNOSIS — R634 Abnormal weight loss: Secondary | ICD-10-CM | POA: Diagnosis not present

## 2016-08-14 DIAGNOSIS — K5090 Crohn's disease, unspecified, without complications: Secondary | ICD-10-CM | POA: Diagnosis not present

## 2016-08-18 ENCOUNTER — Ambulatory Visit (HOSPITAL_COMMUNITY): Payer: Medicare Other | Attending: Cardiovascular Disease

## 2016-08-18 ENCOUNTER — Other Ambulatory Visit: Payer: Self-pay

## 2016-08-18 DIAGNOSIS — I350 Nonrheumatic aortic (valve) stenosis: Secondary | ICD-10-CM | POA: Insufficient documentation

## 2016-09-22 DIAGNOSIS — K5080 Crohn's disease of both small and large intestine without complications: Secondary | ICD-10-CM | POA: Diagnosis not present

## 2016-09-30 DIAGNOSIS — D2272 Melanocytic nevi of left lower limb, including hip: Secondary | ICD-10-CM | POA: Diagnosis not present

## 2016-09-30 DIAGNOSIS — D485 Neoplasm of uncertain behavior of skin: Secondary | ICD-10-CM | POA: Diagnosis not present

## 2016-09-30 DIAGNOSIS — L570 Actinic keratosis: Secondary | ICD-10-CM | POA: Diagnosis not present

## 2016-09-30 DIAGNOSIS — L820 Inflamed seborrheic keratosis: Secondary | ICD-10-CM | POA: Diagnosis not present

## 2016-09-30 DIAGNOSIS — Z85820 Personal history of malignant melanoma of skin: Secondary | ICD-10-CM | POA: Diagnosis not present

## 2016-09-30 DIAGNOSIS — Z85828 Personal history of other malignant neoplasm of skin: Secondary | ICD-10-CM | POA: Diagnosis not present

## 2016-10-01 ENCOUNTER — Ambulatory Visit (INDEPENDENT_AMBULATORY_CARE_PROVIDER_SITE_OTHER): Payer: Medicare Other | Admitting: Orthopaedic Surgery

## 2016-10-01 DIAGNOSIS — G8929 Other chronic pain: Secondary | ICD-10-CM

## 2016-10-01 DIAGNOSIS — M1711 Unilateral primary osteoarthritis, right knee: Secondary | ICD-10-CM

## 2016-10-01 DIAGNOSIS — M25561 Pain in right knee: Principal | ICD-10-CM

## 2016-10-01 MED ORDER — METHYLPREDNISOLONE ACETATE 40 MG/ML IJ SUSP
40.0000 mg | INTRAMUSCULAR | Status: AC | PRN
  Administered 2016-10-01: 40 mg via INTRA_ARTICULAR

## 2016-10-01 NOTE — Progress Notes (Signed)
Office Visit Note   Patient: Colin Larson           Date of Birth: 12/02/1934           MRN: [id number] Visit Date: 10/01/2016              Requested by: Mayra Neer, MD [address] [place], [place] [address] PCP: Mayra Neer, MD   Assessment & Plan: Visit Diagnoses:  1. Chronic pain of right knee   2. Unilateral primary osteoarthritis, right knee     Plan: He did wish for steroid injection in his knee and I agree with this. He tolerated the injection well after thorough discussions risk and benefits of injections. We talked about hyaluronic acid in the future as well.  Follow-Up Instructions: Return if symptoms worsen or fail to improve.   Orders:  No orders of the defined types were placed in this encounter.  No orders of the defined types were placed in this encounter.     Procedures: Large Joint Inj Date/Time: 10/01/2016 10:51 AM Performed by: Mcarthur Rossetti Authorized by: Mcarthur Rossetti   Location:  Knee Site:  R knee Ultrasound Guidance: No   Fluoroscopic Guidance: No   Arthrogram: No   Medications:  40 mg methylPREDNISolone acetate 40 MG/ML     Clinical Data: No additional findings.   Subjective: No chief complaint on file. The patient is well-known to [place]. He has mild to moderate arthritis right is of his right knee and occasionally gets a steroid injection. His last about a year to 2 years ago. He like to have an injection today. He is otherwise doing well. He says it just aches on occasion he walks about 2-3 miles a day at his age of almost 20.   Review of Systems He denies any recent illnesses. Denies any chest pain, headache, short of breath, fever, chills, nausea, vomiting.  Objective: Vital Signs: There were no vitals taken for this visit.  Physical Exam  he is alert and 3 in no acute distress. He does not walk with assistive device. He has no significant limp. Ortho Exam  examination of his right  knee has full range of motion with no significant effusion there is some slight patellofemoral crepitation. It feels ligamentously stable. Specialty Comments:  No specialty comments available.  Imaging: No results found.   PMFS History: Patient Active Problem List   Diagnosis Date Noted  . History of anemia 07/30/2016  . Carotid artery disease ([place])   . GI bleeding   . Mild aortic stenosis   . Mild mitral regurgitation   . Mixed hyperlipidemia 05/04/2013  . GERD (gastroesophageal reflux disease) 05/04/2013  . Diverticulosis of colon with hemorrhage 05/04/2013  . Coronary artery disease 05/04/2013  . Crohn disease ([place]) 05/04/2013  . Melanoma ([place]) 01/07/2012  . Essential hypertension 12/29/2011  . Hypercholesteremia 12/29/2011   Past Medical History:  Diagnosis Date  . Anemia   . Arthritis   . Carotid artery disease ([place])   . Coronary artery disease    a. MI 2000 s/p BMS to prox/mid RCA, 70% lesion in abberant LCx that arises from right coronary cusp per Dr. Hassell Done note  . Crohn disease ([place])   . GI bleeding    2014 - from diverticulosis/Crohn's 2014  . Hiatal hernia    no surgery  . Hypercholesteremia   . Hypertension   . MI (myocardial infarction) [id number]  . Mild aortic stenosis   . Mild mitral regurgitation   .  Skin cancer     Family History  Problem Relation Age of Onset  . Cancer Mother   . Stroke Father     Past Surgical History:  Procedure Laterality Date  . CHOLECYSTECTOMY    . CORONARY STENT PLACEMENT  [id number]  . INGUINAL HERNIA REPAIR     x 2   Social History   Occupational History  . Not on file.   Social History Main Topics  . Smoking status: Former Smoker    Packs/day: 0.14    Years: 5.00    Quit date: 02/15/1975  . Smokeless tobacco: Never Used  . Alcohol use No  . Drug use: No  . Sexual activity: Not on file

## 2016-12-22 DIAGNOSIS — K5090 Crohn's disease, unspecified, without complications: Secondary | ICD-10-CM | POA: Diagnosis not present

## 2017-02-10 DIAGNOSIS — I25119 Atherosclerotic heart disease of native coronary artery with unspecified angina pectoris: Secondary | ICD-10-CM | POA: Diagnosis not present

## 2017-02-10 DIAGNOSIS — I700 Atherosclerosis of aorta: Secondary | ICD-10-CM | POA: Diagnosis not present

## 2017-02-10 DIAGNOSIS — E782 Mixed hyperlipidemia: Secondary | ICD-10-CM | POA: Diagnosis not present

## 2017-02-10 DIAGNOSIS — I119 Hypertensive heart disease without heart failure: Secondary | ICD-10-CM | POA: Diagnosis not present

## 2017-02-25 ENCOUNTER — Ambulatory Visit (INDEPENDENT_AMBULATORY_CARE_PROVIDER_SITE_OTHER): Payer: Medicare Other | Admitting: Orthopaedic Surgery

## 2017-02-25 DIAGNOSIS — M1712 Unilateral primary osteoarthritis, left knee: Secondary | ICD-10-CM | POA: Diagnosis not present

## 2017-02-25 DIAGNOSIS — G8929 Other chronic pain: Secondary | ICD-10-CM | POA: Diagnosis not present

## 2017-02-25 DIAGNOSIS — I6523 Occlusion and stenosis of bilateral carotid arteries: Secondary | ICD-10-CM | POA: Diagnosis not present

## 2017-02-25 DIAGNOSIS — M25561 Pain in right knee: Secondary | ICD-10-CM

## 2017-02-25 DIAGNOSIS — M1711 Unilateral primary osteoarthritis, right knee: Secondary | ICD-10-CM | POA: Diagnosis not present

## 2017-02-25 NOTE — Progress Notes (Signed)
Office Visit Note   Patient: Colin Larson           Date of Birth: Sep 12, 1934           MRN: [id number] Visit Date: 02/25/2017              Requested by: Mayra Neer, MD [address] [place], [place] [address] PCP: Mayra Neer, MD   Assessment & Plan: Visit Diagnoses:  1. Chronic pain of right knee   2. Unilateral primary osteoarthritis, right knee   3. Unilateral primary osteoarthritis, left knee     Plan: I do feel this is a flareup of the arthritis of his knee. He would like a steroid injection in his right knee and I agree with this as well because is been so long since he's had any injections and this is helped him quite a bit in the past. He understands the risk and benefits of the injections and tolerated it well. He'll follow up as needed.  Follow-Up Instructions: Return if symptoms worsen or fail to improve.   Orders:  No orders of the defined types were placed in this encounter.  No orders of the defined types were placed in this encounter.     Procedures: No procedures performed   Clinical Data: No additional findings.   Subjective: No chief complaint on file. The patient comes in today with right knee pain and requesting a steroid injection in his right knee. He 82 year old and is an active individual. He's been seen for arthritis before in his knees. It's been quite sometime since he had an injection. His pain is daily and is slowing him down. He says started affect his activities daily living and the detrimental aspect and his mobility. He still very active. He says he feels like he may have fluid on his left knee but his left knee doesn't hurt his right knee is hurting he points mainly to the medial joint line. He denies any locking catching. He is a thin individual and does not use an assistive device when he walks.  HPI  Review of Systems He currently denies any headache, chest pain, shortness of breath, fever, chills, nausea,  vomiting.  Objective: Vital Signs: There were no vitals taken for this visit.  Physical Exam He is alert and oriented 3 and in no acute distress Ortho Exam Examination of his left knee shows only a slight effusion. Examination of his right knee shows excellent range of motion but some medial joint line tenderness and some patellofemoral crepitation. Specialty Comments:  No specialty comments available.  Imaging: No results found.   PMFS History: Patient Active Problem List   Diagnosis Date Noted  . Unilateral primary osteoarthritis, right knee 02/25/2017  . Chronic pain of right knee 02/25/2017  . Unilateral primary osteoarthritis, left knee 02/25/2017  . History of anemia 07/30/2016  . Carotid artery disease ([place])   . GI bleeding   . Mild aortic stenosis   . Mild mitral regurgitation   . Mixed hyperlipidemia 05/04/2013  . GERD (gastroesophageal reflux disease) 05/04/2013  . Diverticulosis of colon with hemorrhage 05/04/2013  . Coronary artery disease 05/04/2013  . Crohn disease ([place]) 05/04/2013  . Melanoma ([place]) 01/07/2012  . Essential hypertension 12/29/2011  . Hypercholesteremia 12/29/2011   Past Medical History:  Diagnosis Date  . Anemia   . Arthritis   . Carotid artery disease ([place])   . Coronary artery disease    a. MI 2000 s/p BMS to prox/mid  RCA, 70% lesion in abberant LCx that arises from right coronary cusp per Dr. Hassell Done note  . Crohn disease ([place])   . GI bleeding    2014 - from diverticulosis/Crohn's 2014  . Hiatal hernia    no surgery  . Hypercholesteremia   . Hypertension   . MI (myocardial infarction) [id number]  . Mild aortic stenosis   . Mild mitral regurgitation   . Skin cancer     Family History  Problem Relation Age of Onset  . Cancer Mother   . Stroke Father     Past Surgical History:  Procedure Laterality Date  . CHOLECYSTECTOMY    . CORONARY STENT PLACEMENT  [id number]  . INGUINAL HERNIA REPAIR     x 2   Social History    Occupational History  . Not on file.   Social History Main Topics  . Smoking status: Former Smoker    Packs/day: 0.14    Years: 5.00    Quit date: 02/15/1975  . Smokeless tobacco: Never Used  . Alcohol use No  . Drug use: No  . Sexual activity: Not on file

## 2017-03-25 DIAGNOSIS — H9190 Unspecified hearing loss, unspecified ear: Secondary | ICD-10-CM | POA: Diagnosis not present

## 2017-03-25 DIAGNOSIS — Z23 Encounter for immunization: Secondary | ICD-10-CM | POA: Diagnosis not present

## 2017-04-10 ENCOUNTER — Other Ambulatory Visit: Payer: Self-pay | Admitting: *Deleted

## 2017-04-10 DIAGNOSIS — I6523 Occlusion and stenosis of bilateral carotid arteries: Secondary | ICD-10-CM

## 2017-04-21 DIAGNOSIS — D2272 Melanocytic nevi of left lower limb, including hip: Secondary | ICD-10-CM | POA: Diagnosis not present

## 2017-04-21 DIAGNOSIS — Z85820 Personal history of malignant melanoma of skin: Secondary | ICD-10-CM | POA: Diagnosis not present

## 2017-04-21 DIAGNOSIS — L570 Actinic keratosis: Secondary | ICD-10-CM | POA: Diagnosis not present

## 2017-04-21 DIAGNOSIS — Z23 Encounter for immunization: Secondary | ICD-10-CM | POA: Diagnosis not present

## 2017-04-21 DIAGNOSIS — D225 Melanocytic nevi of trunk: Secondary | ICD-10-CM | POA: Diagnosis not present

## 2017-04-21 DIAGNOSIS — Z85828 Personal history of other malignant neoplasm of skin: Secondary | ICD-10-CM | POA: Diagnosis not present

## 2017-07-29 ENCOUNTER — Ambulatory Visit (HOSPITAL_COMMUNITY)
Admission: RE | Admit: 2017-07-29 | Discharge: 2017-07-29 | Disposition: A | Discharge status: Home / Self Care | Payer: Medicare Other | Source: Ambulatory Visit | Attending: Cardiovascular Disease | Admitting: Cardiovascular Disease

## 2017-07-29 DIAGNOSIS — I6523 Occlusion and stenosis of bilateral carotid arteries: Secondary | ICD-10-CM | POA: Diagnosis not present

## 2017-08-05 ENCOUNTER — Other Ambulatory Visit: Payer: Self-pay

## 2017-08-05 DIAGNOSIS — I779 Disorder of arteries and arterioles, unspecified: Secondary | ICD-10-CM

## 2017-08-05 DIAGNOSIS — I739 Peripheral vascular disease, unspecified: Principal | ICD-10-CM

## 2017-08-13 ENCOUNTER — Other Ambulatory Visit: Payer: Self-pay | Admitting: Family Medicine

## 2017-08-13 DIAGNOSIS — K5090 Crohn's disease, unspecified, without complications: Secondary | ICD-10-CM | POA: Diagnosis not present

## 2017-08-13 DIAGNOSIS — Z85820 Personal history of malignant melanoma of skin: Secondary | ICD-10-CM | POA: Diagnosis not present

## 2017-08-13 DIAGNOSIS — I119 Hypertensive heart disease without heart failure: Secondary | ICD-10-CM | POA: Diagnosis not present

## 2017-08-13 DIAGNOSIS — K118 Other diseases of salivary glands: Secondary | ICD-10-CM

## 2017-08-13 DIAGNOSIS — Z0000 Encounter for general adult medical examination without abnormal findings: Secondary | ICD-10-CM | POA: Diagnosis not present

## 2017-08-13 DIAGNOSIS — E782 Mixed hyperlipidemia: Secondary | ICD-10-CM | POA: Diagnosis not present

## 2017-08-13 DIAGNOSIS — K219 Gastro-esophageal reflux disease without esophagitis: Secondary | ICD-10-CM | POA: Diagnosis not present

## 2017-08-13 DIAGNOSIS — I700 Atherosclerosis of aorta: Secondary | ICD-10-CM | POA: Diagnosis not present

## 2017-08-13 DIAGNOSIS — I25119 Atherosclerotic heart disease of native coronary artery with unspecified angina pectoris: Secondary | ICD-10-CM | POA: Diagnosis not present

## 2017-08-14 ENCOUNTER — Ambulatory Visit
Admission: RE | Admit: 2017-08-14 | Discharge: 2017-08-14 | Disposition: A | Discharge status: Home / Self Care | Payer: Medicare Other | Source: Ambulatory Visit | Attending: Family Medicine | Admitting: Family Medicine

## 2017-08-14 DIAGNOSIS — K118 Other diseases of salivary glands: Secondary | ICD-10-CM

## 2017-08-14 DIAGNOSIS — R221 Localized swelling, mass and lump, neck: Secondary | ICD-10-CM | POA: Diagnosis not present

## 2017-08-17 ENCOUNTER — Encounter: Payer: Self-pay | Admitting: Interventional Cardiology

## 2017-08-20 DIAGNOSIS — L570 Actinic keratosis: Secondary | ICD-10-CM | POA: Diagnosis not present

## 2017-08-24 ENCOUNTER — Encounter: Payer: Self-pay | Admitting: Interventional Cardiology

## 2017-08-24 ENCOUNTER — Ambulatory Visit (INDEPENDENT_AMBULATORY_CARE_PROVIDER_SITE_OTHER): Payer: Medicare Other | Admitting: Interventional Cardiology

## 2017-08-24 VITALS — BP 124/86 | HR 60 | Ht 67.0 in | Wt 149.8 lb

## 2017-08-24 DIAGNOSIS — R7989 Other specified abnormal findings of blood chemistry: Secondary | ICD-10-CM

## 2017-08-24 DIAGNOSIS — I6523 Occlusion and stenosis of bilateral carotid arteries: Secondary | ICD-10-CM | POA: Diagnosis not present

## 2017-08-24 DIAGNOSIS — I350 Nonrheumatic aortic (valve) stenosis: Secondary | ICD-10-CM | POA: Diagnosis not present

## 2017-08-24 DIAGNOSIS — E785 Hyperlipidemia, unspecified: Secondary | ICD-10-CM

## 2017-08-24 DIAGNOSIS — I2510 Atherosclerotic heart disease of native coronary artery without angina pectoris: Secondary | ICD-10-CM

## 2017-08-24 DIAGNOSIS — I779 Disorder of arteries and arterioles, unspecified: Secondary | ICD-10-CM | POA: Diagnosis not present

## 2017-08-24 DIAGNOSIS — I119 Hypertensive heart disease without heart failure: Secondary | ICD-10-CM

## 2017-08-24 DIAGNOSIS — I739 Peripheral vascular disease, unspecified: Secondary | ICD-10-CM

## 2017-08-24 DIAGNOSIS — R945 Abnormal results of liver function studies: Secondary | ICD-10-CM

## 2017-08-24 NOTE — Patient Instructions (Signed)

## 2017-08-24 NOTE — Progress Notes (Signed)
Cardiology Office Note   Date:  08/24/2017   ID:  Colin Larson, DOB Jun 04, 1935, MRN [id number]  PCP:  Mayra Neer, MD    No chief complaint on file.  CAD/Old MI  Wt Readings from Last 3 Encounters:  08/24/17 149 lb 12.8 oz (67.9 kg)  07/31/16 146 lb (66.2 kg)  06/26/14 157 lb (71.2 kg)       History of Present Illness: Colin Larson is a 83 y.o. male  with history of CAD (MI 2000 s/p BMS to prox/mid RCA, 70% lesion in abberant LCx that arises from right coronary cusp), carotid disease, mild AS, HTN, HLD, arthritis, Crohn's disease, hiatal hernia, GIB (from diverticulosis/Crohn's 2014), melanoma of the skin who presents for yearly routine follow-up. Last echo 2014: mild LVH, EF 60-65%, mild AS, mild MR. Carotid duplex 07/2016: 08-67% RICA, 6-19% LICA, >50% RECA. Labs 2016: CMET unremarkable except Na 135 (Cr 1.06, LFTs wnl), LDL 64, trig 114. Prior CBC in 2014 showed Hgb 10.4.  Denies : Chest pain. Dizziness. Leg edema. Nitroglycerin use. Orthopnea. Palpitations. Paroxysmal nocturnal dyspnea. Shortness of breath. Syncope.   He walks daily.  No problems.  He had some elevated LFTs.  He has been on 80 mg daily fo atorvastatin for  Along time.  He has had more Tylenol intake than before for arthritis pain.    BP has been ok at other MD visits.    Past Medical History:  Diagnosis Date  . Anemia   . Arthritis   . Carotid artery disease ([place])   . Coronary artery disease    a. MI 2000 s/p BMS to prox/mid RCA, 70% lesion in abberant LCx that arises from right coronary cusp per Dr. Hassell Done note  . Crohn disease ([place])   . GI bleeding    2014 - from diverticulosis/Crohn's 2014  . Hiatal hernia    no surgery  . Hypercholesteremia   . Hypertension   . MI (myocardial infarction) ([place]) [id number]  . Mild aortic stenosis   . Mild mitral regurgitation   . Skin cancer     Past Surgical History:  Procedure Laterality Date  . CHOLECYSTECTOMY    . CORONARY STENT  PLACEMENT  [id number]  . INGUINAL HERNIA REPAIR     x 2     Current Outpatient Medications  Medication Sig Dispense Refill  . acetaminophen (TYLENOL) 500 MG tablet Take 1,000 mg by mouth every 6 (six) hours as needed for mild pain or moderate pain.    Marland Kitchen aspirin EC 81 MG tablet Take 81 mg by mouth daily.    Marland Kitchen atorvastatin (LIPITOR) 80 MG tablet Take 80 mg by mouth daily.    . balsalazide (COLAZAL) 750 MG capsule Take 2 capsules by mouth 2 (two) times daily.    . lansoprazole (PREVACID) 30 MG capsule Take 30 mg by mouth daily at 12 noon.    . Multiple Vitamin (MULTIVITAMIN WITH MINERALS) TABS tablet Take 1 tablet by mouth daily.    . nitroGLYCERIN (NITROSTAT) 0.4 MG SL tablet Place 1 tablet (0.4 mg total) under the tongue every 5 (five) minutes as needed. For chest pain 25 tablet 3  . omeprazole (PRILOSEC) 20 MG capsule Take 20 mg by mouth daily.    . ramipril (ALTACE) 5 MG capsule Take 5 mg by mouth daily.     No current facility-administered medications for this visit.     Allergies:   Bee venom    Social History:  The patient  reports that  he quit smoking about 42 years ago. He has a 0.70 pack-year smoking history. he has never used smokeless tobacco. He reports that he does not drink alcohol or use drugs.   Family History:  The patient's family history includes Cancer in his mother; Stroke in his father.    ROS:  Please see the history of present illness.   Otherwise, review of systems are positive for frequent skin cancers.   All other systems are reviewed and negative.    PHYSICAL EXAM: VS:  BP 124/86   Pulse 60   Ht 5' 7"  (1.702 m)   Wt 149 lb 12.8 oz (67.9 kg)   SpO2 97%   BMI 23.46 kg/m  , BMI Body mass index is 23.46 kg/m. GEN: Well nourished, well developed, in no acute distress  HEENT: normal  Neck: no JVD, carotid bruits, or masses Cardiac: RRR; no murmurs, rubs, or gallops,no edema  Respiratory:  clear to auscultation bilaterally, normal work of breathing GI:  soft, nontender, nondistended, + BS MS: no deformity or atrophy  Skin: warm and dry, no rash Neuro:  Strength and sensation are intact Psych: euthymic mood, full affect   EKG:   The ekg ordered today demonstrates NSR, RBBB   Recent Labs: No results found for requested labs within last 8760 hours.   Lipid Panel No results found for: CHOL, TRIG, HDL, CHOLHDL, VLDL, LDLCALC, LDLDIRECT   Other studies Reviewed: Additional studies/ records that were reviewed today with results demonstrating: LDL 61 in 2/19.   ASSESSMENT AND PLAN:  1. CAD: No angina on medical therapy.  Continue aggressive secondary prevention.   2. Hypertensive heart disease: BP controlled.  The current medical regimen is effective;  continue present plan and medications. 3. Mild aortic stenosis: No sx of severe AS. 4. Carotid artery disease: stable disease in 2/19. Recheck in 2020.  5. Elevated LFTs: Most likely related to increased Tylenol intake per his report.  If no imrovement with less Tylenol, may have to try decreasing atorvastatin to 40 mg daily.  LDL at target so I would prefer that statin dose be left unchanged.   Current medicines are reviewed at length with the patient today.  The patient concerns regarding his medicines were addressed.  The following changes have been made:  No change  Labs/ tests ordered today include:  No orders of the defined types were placed in this encounter.   Recommend 150 minutes/week of aerobic exercise Low fat, low carb, high fiber diet recommended  Disposition:   FU in 1 year   Signed, Larae Grooms, MD  08/24/2017 3:17 PM    [facility] [place], [place], [place]  [address] Phone: [phone]; Fax: [phone]

## 2017-08-27 DIAGNOSIS — R945 Abnormal results of liver function studies: Secondary | ICD-10-CM | POA: Diagnosis not present

## 2017-09-07 ENCOUNTER — Encounter (INDEPENDENT_AMBULATORY_CARE_PROVIDER_SITE_OTHER): Payer: Self-pay | Admitting: Orthopaedic Surgery

## 2017-09-07 ENCOUNTER — Ambulatory Visit (INDEPENDENT_AMBULATORY_CARE_PROVIDER_SITE_OTHER): Payer: Medicare Other | Admitting: Orthopaedic Surgery

## 2017-09-07 DIAGNOSIS — I6523 Occlusion and stenosis of bilateral carotid arteries: Secondary | ICD-10-CM

## 2017-09-07 DIAGNOSIS — M1712 Unilateral primary osteoarthritis, left knee: Secondary | ICD-10-CM | POA: Diagnosis not present

## 2017-09-07 DIAGNOSIS — M1711 Unilateral primary osteoarthritis, right knee: Secondary | ICD-10-CM

## 2017-09-07 NOTE — Progress Notes (Signed)
Office Visit Note   Patient: BASHEER MOLCHAN           Date of Birth: 01/24/1935           MRN: [id number] Visit Date: 09/07/2017              Requested by: Mayra Neer, MD [address] [place], [place] [address] PCP: Mayra Neer, MD   Assessment & Plan: Visit Diagnoses:  1. Unilateral primary osteoarthritis, right knee   2. Unilateral primary osteoarthritis, left knee     Plan: It is been 6 months since his last knee injection.  This is helping him the most so I think is reasonable to try bilateral knee injections again with steroid today.  This is what he really wants as well and I agree with this.  He understands the risk and benefits of injections.  He tolerated these well and will follow-up as needed.  Follow-Up Instructions: Return if symptoms worsen or fail to improve.   Orders:  Orders Placed This Encounter  Procedures  . Large Joint Inj   No orders of the defined types were placed in this encounter.     Procedures: Large Joint Inj: bilateral knee on 09/07/2017 1:34 PM Indications: diagnostic evaluation and pain Details: 22 G 1.5 in needle, superolateral approach  Arthrogram: No  Outcome: tolerated well, no immediate complications Procedure, treatment alternatives, risks and benefits explained, specific risks discussed. Consent was given by the patient. Immediately prior to procedure a time out was called to verify the correct patient, procedure, equipment, support staff and site/side marked as required. Patient was prepped and draped in the usual sterile fashion.       Clinical Data: No additional findings.   Subjective: Chief Complaint  Patient presents with  . Left Knee - Pain  The patient comes in with bilateral knee pain that is slowly worsening with time.  He has had injections in the past that has helped.  He is taking just Tylenol right now and would like to have injections today.  He said no other recent changes in medical  status.  He is 83 years old and is active.  HPI  Review of Systems He currently denies any headache, chest pain, shortness of breath, fever, chills, nausea, vomiting.  Objective: Vital Signs: There were no vitals taken for this visit.  Physical Exam He is alert and oriented x3 and in no acute distress Ortho Exam Examination of both knees show slight effusion.  He has patellofemoral crepitation and slight varus malalignment with full range of motion. Specialty Comments:  No specialty comments available.  Imaging: No results found.   PMFS History: Patient Active Problem List   Diagnosis Date Noted  . Elevated LFTs 08/24/2017  . Unilateral primary osteoarthritis, right knee 02/25/2017  . Chronic pain of right knee 02/25/2017  . Unilateral primary osteoarthritis, left knee 02/25/2017  . History of anemia 07/30/2016  . Carotid artery disease ([place])   . GI bleeding   . Mild aortic stenosis   . Mild mitral regurgitation   . Mixed hyperlipidemia 05/04/2013  . GERD (gastroesophageal reflux disease) 05/04/2013  . Diverticulosis of colon with hemorrhage 05/04/2013  . Coronary artery disease 05/04/2013  . Crohn disease ([place]) 05/04/2013  . Melanoma ([place]) 01/07/2012  . Essential hypertension 12/29/2011  . Hypercholesteremia 12/29/2011   Past Medical History:  Diagnosis Date  . Anemia   . Arthritis   . Carotid artery disease ([place])   . Coronary artery disease  a. MI 2000 s/p BMS to prox/mid RCA, 70% lesion in abberant LCx that arises from right coronary cusp per Dr. Hassell Done note  . Crohn disease ([place])   . GI bleeding    2014 - from diverticulosis/Crohn's 2014  . Hiatal hernia    no surgery  . Hypercholesteremia   . Hypertension   . MI (myocardial infarction) ([place]) [id number]  . Mild aortic stenosis   . Mild mitral regurgitation   . Skin cancer     Family History  Problem Relation Age of Onset  . Cancer Mother   . Stroke Father     Past Surgical History:  Procedure  Laterality Date  . CHOLECYSTECTOMY    . CORONARY STENT PLACEMENT  [id number]  . INGUINAL HERNIA REPAIR     x 2   Social History   Occupational History  . Not on file  Tobacco Use  . Smoking status: Former Smoker    Packs/day: 0.14    Years: 5.00    Pack years: 0.70    Last attempt to quit: 02/15/1975    Years since quitting: 42.5  . Smokeless tobacco: Never Used  Substance and Sexual Activity  . Alcohol use: No  . Drug use: No  . Sexual activity: Not on file

## 2017-09-10 DIAGNOSIS — R609 Edema, unspecified: Secondary | ICD-10-CM | POA: Diagnosis not present

## 2017-09-10 DIAGNOSIS — K117 Disturbances of salivary secretion: Secondary | ICD-10-CM | POA: Diagnosis not present

## 2017-09-10 DIAGNOSIS — R220 Localized swelling, mass and lump, head: Secondary | ICD-10-CM | POA: Diagnosis not present

## 2017-10-22 DIAGNOSIS — D2272 Melanocytic nevi of left lower limb, including hip: Secondary | ICD-10-CM | POA: Diagnosis not present

## 2017-10-22 DIAGNOSIS — L570 Actinic keratosis: Secondary | ICD-10-CM | POA: Diagnosis not present

## 2017-10-22 DIAGNOSIS — B353 Tinea pedis: Secondary | ICD-10-CM | POA: Diagnosis not present

## 2017-10-22 DIAGNOSIS — D225 Melanocytic nevi of trunk: Secondary | ICD-10-CM | POA: Diagnosis not present

## 2017-10-22 DIAGNOSIS — Z85828 Personal history of other malignant neoplasm of skin: Secondary | ICD-10-CM | POA: Diagnosis not present

## 2017-10-22 DIAGNOSIS — L309 Dermatitis, unspecified: Secondary | ICD-10-CM | POA: Diagnosis not present

## 2017-10-22 DIAGNOSIS — Z85820 Personal history of malignant melanoma of skin: Secondary | ICD-10-CM | POA: Diagnosis not present

## 2017-11-05 DIAGNOSIS — N481 Balanitis: Secondary | ICD-10-CM | POA: Diagnosis not present

## 2017-11-30 ENCOUNTER — Other Ambulatory Visit: Payer: Self-pay

## 2017-11-30 ENCOUNTER — Inpatient Hospital Stay (HOSPITAL_COMMUNITY)
Admission: EM | Admit: 2017-11-30 | Discharge: 2017-12-02 | DRG: 309 | Disposition: A | Discharge status: Home / Self Care | Payer: Medicare Other | Attending: Internal Medicine | Admitting: Internal Medicine

## 2017-11-30 ENCOUNTER — Encounter (HOSPITAL_COMMUNITY): Payer: Self-pay

## 2017-11-30 ENCOUNTER — Emergency Department (HOSPITAL_COMMUNITY): Payer: Medicare Other

## 2017-11-30 DIAGNOSIS — Z91030 Bee allergy status: Secondary | ICD-10-CM

## 2017-11-30 DIAGNOSIS — K449 Diaphragmatic hernia without obstruction or gangrene: Secondary | ICD-10-CM | POA: Diagnosis present

## 2017-11-30 DIAGNOSIS — Z9049 Acquired absence of other specified parts of digestive tract: Secondary | ICD-10-CM | POA: Diagnosis not present

## 2017-11-30 DIAGNOSIS — D649 Anemia, unspecified: Secondary | ICD-10-CM | POA: Diagnosis present

## 2017-11-30 DIAGNOSIS — Z85828 Personal history of other malignant neoplasm of skin: Secondary | ICD-10-CM

## 2017-11-30 DIAGNOSIS — E876 Hypokalemia: Secondary | ICD-10-CM | POA: Diagnosis present

## 2017-11-30 DIAGNOSIS — I2510 Atherosclerotic heart disease of native coronary artery without angina pectoris: Secondary | ICD-10-CM | POA: Diagnosis present

## 2017-11-30 DIAGNOSIS — Z955 Presence of coronary angioplasty implant and graft: Secondary | ICD-10-CM | POA: Diagnosis not present

## 2017-11-30 DIAGNOSIS — E782 Mixed hyperlipidemia: Secondary | ICD-10-CM | POA: Diagnosis present

## 2017-11-30 DIAGNOSIS — I10 Essential (primary) hypertension: Secondary | ICD-10-CM | POA: Diagnosis present

## 2017-11-30 DIAGNOSIS — Z7982 Long term (current) use of aspirin: Secondary | ICD-10-CM | POA: Diagnosis not present

## 2017-11-30 DIAGNOSIS — Z79899 Other long term (current) drug therapy: Secondary | ICD-10-CM | POA: Diagnosis not present

## 2017-11-30 DIAGNOSIS — K5090 Crohn's disease, unspecified, without complications: Secondary | ICD-10-CM | POA: Diagnosis present

## 2017-11-30 DIAGNOSIS — M542 Cervicalgia: Secondary | ICD-10-CM | POA: Diagnosis not present

## 2017-11-30 DIAGNOSIS — I739 Peripheral vascular disease, unspecified: Secondary | ICD-10-CM

## 2017-11-30 DIAGNOSIS — Z974 Presence of external hearing-aid: Secondary | ICD-10-CM | POA: Diagnosis not present

## 2017-11-30 DIAGNOSIS — Z87891 Personal history of nicotine dependence: Secondary | ICD-10-CM

## 2017-11-30 DIAGNOSIS — R42 Dizziness and giddiness: Secondary | ICD-10-CM | POA: Diagnosis not present

## 2017-11-30 DIAGNOSIS — Z809 Family history of malignant neoplasm, unspecified: Secondary | ICD-10-CM

## 2017-11-30 DIAGNOSIS — G311 Senile degeneration of brain, not elsewhere classified: Secondary | ICD-10-CM | POA: Diagnosis present

## 2017-11-30 DIAGNOSIS — I252 Old myocardial infarction: Secondary | ICD-10-CM

## 2017-11-30 DIAGNOSIS — I340 Nonrheumatic mitral (valve) insufficiency: Secondary | ICD-10-CM | POA: Diagnosis not present

## 2017-11-30 DIAGNOSIS — R001 Bradycardia, unspecified: Secondary | ICD-10-CM | POA: Diagnosis not present

## 2017-11-30 DIAGNOSIS — I4510 Unspecified right bundle-branch block: Secondary | ICD-10-CM | POA: Diagnosis present

## 2017-11-30 DIAGNOSIS — I080 Rheumatic disorders of both mitral and aortic valves: Secondary | ICD-10-CM | POA: Diagnosis present

## 2017-11-30 DIAGNOSIS — I779 Disorder of arteries and arterioles, unspecified: Secondary | ICD-10-CM | POA: Diagnosis present

## 2017-11-30 DIAGNOSIS — K219 Gastro-esophageal reflux disease without esophagitis: Secondary | ICD-10-CM | POA: Diagnosis present

## 2017-11-30 DIAGNOSIS — H9192 Unspecified hearing loss, left ear: Secondary | ICD-10-CM | POA: Diagnosis present

## 2017-11-30 LAB — COMPREHENSIVE METABOLIC PANEL
ALBUMIN: 3.5 g/dL (ref 3.5–5.0)
ALK PHOS: 56 U/L (ref 38–126)
ALT: 22 U/L (ref 17–63)
AST: 34 U/L (ref 15–41)
Anion gap: 8 (ref 5–15)
BILIRUBIN TOTAL: 0.6 mg/dL (ref 0.3–1.2)
BUN: 11 mg/dL (ref 6–20)
CALCIUM: 8.6 mg/dL — AB (ref 8.9–10.3)
CO2: 29 mmol/L (ref 22–32)
Chloride: 106 mmol/L (ref 101–111)
Creatinine, Ser: 0.97 mg/dL (ref 0.61–1.24)
GFR calc Af Amer: >60 mL/min (ref >60)
GFR calc non Af Amer: >60 mL/min (ref >60)
GLUCOSE: 85 mg/dL (ref 65–99)
POTASSIUM: 3.4 mmol/L — AB (ref 3.5–5.1)
SODIUM: 143 mmol/L (ref 135–145)
Total Protein: 6.6 g/dL (ref 6.5–8.1)

## 2017-11-30 LAB — CBC
HEMATOCRIT: 37.9 % — AB (ref 39.0–52.0)
HEMOGLOBIN: 11.9 g/dL — AB (ref 13.0–17.0)
MCH: 29.4 pg (ref 26.0–34.0)
MCHC: 31.4 g/dL (ref 30.0–36.0)
MCV: 93.6 fL (ref 78.0–100.0)
Platelets: 202 10*3/uL (ref 150–400)
RBC: 4.05 MIL/uL — ABNORMAL LOW (ref 4.22–5.81)
RDW: 13.9 % (ref 11.5–15.5)
WBC: 5.9 10*3/uL (ref 4.0–10.5)

## 2017-11-30 LAB — I-STAT TROPONIN, ED: Troponin i, poc: 0.01 ng/mL (ref 0.00–0.08)

## 2017-11-30 MED ORDER — POTASSIUM CHLORIDE CRYS ER 20 MEQ PO TBCR
40.0000 meq | EXTENDED_RELEASE_TABLET | Freq: Once | ORAL | Status: AC
  Administered 2017-12-01: 40 meq via ORAL
  Filled 2017-11-30: qty 2

## 2017-11-30 NOTE — ED Notes (Addendum)
Doctor provided pt with PO crackers following MRI results; Per Dr Ralene Bathe, cancel swallow screen, pt is okay to take POs

## 2017-11-30 NOTE — ED Provider Notes (Signed)
Patient placed in Quick Look pathway, seen and evaluated   Chief Complaint: dizziness and nausea  HPI:   Pt complains of pain on the left side of his neck and feeling dizzy   ROS: no chest pain, no abdominal pain  Physical Exam:   Gen: No distress  Neuro: Awake and Alert  Skin: Warm    Focused Exam: Lungs clear, Heart rrr   Initiation of care has begun. The patient has been counseled on the process, plan, and necessity for staying for the completion/evaluation, and the remainder of the medical screening examination   Colin Larson 11/30/17 Colin Larson, [place], MD 12/09/17 [phone]

## 2017-11-30 NOTE — H&P (Addendum)
History and Physical    Colin Larson SPQ:[id number] DOB: Jul 16, 1934 DOA: 11/30/2017  PCP: Mayra Neer, MD   Patient coming from: Home  Chief Complaint: Dizziness  HPI: Colin Larson is a 83 y.o. male with medical history significant for HTN, coronary and carotid and disease, Crohn's disease.  Patient presented to the ED with complaints of dizziness that started this morning.  Patient describes a sensation of the room spinning.  Present when lying down but worse with standing.  Symptoms gradually improve initially when he laid down for about 20 minutes.  But reoccurred later in the morning when he got up to use the bathroom.  Patient reports some mild neck ache, to the left side of his neck, intermittent that also started today.  Patient denies chest pain, shortness of breath.   ED Course: Vitals heart rate 39 to mostly 60.  Blood pressure systolic 333- 545.  O2 sats greater than 94% on room air.  MRI brain negative for acute abnormality, shows age-related cerebral atrophy.  Hemoglobin stable 11.1, sodium 143, k- 3.4, creatinine at baseline 0.97.  Initial EKG shows rate 66bpm, old RBBB. EDP notes during evaluation heart rates intermittently drops vas low as 30s, confirmed on repeat EKG-heart rate 47, sinus rhythm.  Hospitalist was called to admit for dizziness, presyncope work-up.  Review of Systems: As per HPI otherwise 10 point review of systems negative.   Past Medical History:  Diagnosis Date  . Anemia   . Arthritis   . Carotid artery disease ([place])   . Coronary artery disease    a. MI 2000 s/p BMS to prox/mid RCA, 70% lesion in abberant LCx that arises from right coronary cusp per Dr. Hassell Done note  . Crohn disease ([place])   . GI bleeding    2014 - from diverticulosis/Crohn's 2014  . Hiatal hernia    no surgery  . Hypercholesteremia   . Hypertension   . MI (myocardial infarction) ([place]) [id number]  . Mild aortic stenosis   . Mild mitral regurgitation   . Skin cancer      Past Surgical History:  Procedure Laterality Date  . CHOLECYSTECTOMY    . CORONARY STENT PLACEMENT  [id number]  . INGUINAL HERNIA REPAIR     x 2     reports that he quit smoking about 42 years ago. He has a 0.70 pack-year smoking history. He has never used smokeless tobacco. He reports that he does not drink alcohol or use drugs.  Allergies  Allergen Reactions  . Bee Venom Anaphylaxis and Swelling    Family History  Problem Relation Age of Onset  . Cancer Mother   . Stroke Father     Prior to Admission medications   Medication Sig Start Date End Date Taking? Authorizing Provider  acetaminophen (TYLENOL) 500 MG tablet Take 1,000 mg by mouth every 6 (six) hours as needed for mild pain or moderate pain.   Yes [provider]  aspirin EC 81 MG tablet Take 81 mg by mouth daily.   Yes [provider]  atorvastatin (LIPITOR) 80 MG tablet Take 80 mg by mouth daily. 06/10/17  Yes [provider]  balsalazide (COLAZAL) 750 MG capsule Take 2 capsules by mouth 2 (two) times daily. 08/12/17  Yes [provider]  ketoconazole (NIZORAL) 2 % cream Apply 1 application topically daily. 11/05/17  Yes [provider]  Multiple Vitamin (MULTIVITAMIN WITH MINERALS) TABS tablet Take 1 tablet by mouth daily.   Yes [provider]  nitroGLYCERIN (  NITROSTAT) 0.4 MG SL tablet Place 1 tablet (0.4 mg total) under the tongue every 5 (five) minutes as needed. For chest pain 07/31/16  Yes Dunn, Dayna N, PA-C  omeprazole (PRILOSEC) 20 MG capsule Take 20 mg by mouth daily. 08/12/17  Yes [provider]  ramipril (ALTACE) 5 MG capsule Take 5 mg by mouth daily.   Yes [provider]  triamcinolone cream (KENALOG) 0.1 % Apply 1 application topically daily. 10/22/17  Yes [provider]    Physical Exam: Vitals:   11/30/17 2045 11/30/17 2059 11/30/17 2145 11/30/17 2200  BP:   107/73 120/71  Pulse: 60 (!) 50 (!) 50 (!) 53  Resp: (!) 22   20 (!) 22  Temp:      TempSrc:      SpO2: 97%  95% 94%    Constitutional: NAD, calm, comfortable Vitals:   11/30/17 2045 11/30/17 2059 11/30/17 2145 11/30/17 2200  BP:   107/73 120/71  Pulse: 60 (!) 50 (!) 50 (!) 53  Resp: (!) 22  20 (!) 22  Temp:      TempSrc:      SpO2: 97%  95% 94%   Eyes: PERRL, lids and conjunctivae normal ENMT: Mucous membranes are moist. Posterior pharynx clear of any exudate or lesions.Normal dentition.  Neck: normal, supple, no masses, no thyromegaly, patient points to lower sternocleidomastoid muscle- left neck-where he had pain earlier.  Respiratory: clear to auscultation bilaterally, no wheezing, no crackles. Normal respiratory effort. No accessory muscle use.  Cardiovascular: Regular rate and rhythm, no murmurs / rubs / gallops. No extremity edema. 2+ pedal pulses. No carotid bruits appreciated.  Abdomen: no tenderness, no masses palpated. No hepatosplenomegaly. Bowel sounds positive.  Musculoskeletal: no clubbing / cyanosis. No joint deformity upper and lower extremities. Good ROM, no contractures. Normal muscle tone.  Skin: no rashes, lesions, ulcers. No induration Neurologic: CN 2-12 grossly intact.  Strength 5/5 in all 4.  Psychiatric: Normal judgment and insight. Alert and oriented x 3. Normal mood.   Labs on Admission: I have personally reviewed following labs and imaging studies  CBC: Recent Labs  Lab 11/30/17 1353  WBC 5.9  HGB 11.9*  HCT 37.9*  MCV 93.6  PLT 478   Basic Metabolic Panel: Recent Labs  Lab 11/30/17 1353  NA 143  K 3.4*  CL 106  CO2 29  GLUCOSE 85  BUN 11  CREATININE 0.97  CALCIUM 8.6*   Liver Function Tests: Recent Labs  Lab 11/30/17 1353  AST 34  ALT 22  ALKPHOS 56  BILITOT 0.6  PROT 6.6  ALBUMIN 3.5   Radiological Exams on Admission: Dg Chest 2 View  Result Date: 11/30/2017 CLINICAL DATA:  83 year old male with dizziness, nausea, right side neck pain. EXAM: CHEST - 2 VIEW COMPARISON:  Portable  chest 11/12/2011. FINDINGS: Upright AP and lateral views. Stable cardiac size at the upper limits of normal to mildly enlarged. Other mediastinal contours are within normal limits. Visualized tracheal air column is within normal limits. The lungs appear stable and clear. No pneumothorax or pleural effusion. Stable cholecystectomy clips. Negative visible bowel gas pattern. No acute osseous abnormality identified. IMPRESSION: No acute cardiopulmonary abnormality. Electronically Signed   By: Genevie Ann M.D.   On: 11/30/2017 14:32   Mr Brain Wo Contrast (neuro Protocol)  Result Date: 11/30/2017 CLINICAL DATA:  Initial evaluation for acute dizziness. EXAM: MRI HEAD WITHOUT CONTRAST TECHNIQUE: Multiplanar, multiecho pulse sequences of the brain and surrounding structures were obtained without intravenous contrast.  COMPARISON:  None available. FINDINGS: Brain: Diffuse prominence of the CSF containing spaces compatible with generalized age-related cerebral atrophy. Mild chronic small vessel ischemic change present within the periventricular and deep white matter both cerebral hemispheres. No abnormal foci of restricted diffusion to suggest acute or subacute ischemia. Gray-white matter differentiation maintained. No areas of remote cortical encephalomalacia to suggest prior infarction. No acute intracranial hemorrhage. Scattered chronic micro hemorrhages seen throughout the left cerebral hemisphere, which may be hypertensive in nature. Sequelae of cerebral amyloid also be considered. No mass lesion, midline shift or mass effect. No hydrocephalus. No extra-axial fluid collection. Pituitary gland normal. Vascular: Major intracranial vascular flow voids maintained. Skull and upper cervical spine: Craniocervical junction normal. Upper cervical spine within normal limits. Bone marrow signal intensity normal. No scalp soft tissue abnormality. Sinuses/Orbits: Globes and orbital soft tissues within normal limits. Right maxillary  sinus retention cyst noted. Paranasal sinuses are otherwise clear. Trace bilateral mastoid effusions, chronic in appearance. Inner ear structures grossly normal. Other: None. IMPRESSION: 1. No acute intracranial abnormality. 2. Age-related cerebral atrophy with mild chronic small vessel ischemic disease. Electronically Signed   By: Jeannine Boga M.D.   On: 11/30/2017 19:30     EKG: Independently reviewed. Old RBBB, heart rate initially 66, repeat EKG heart rate 47.  Sinus rhythm.  Assessment/Plan Principal Problem:   Dizziness Active Problems:   Coronary artery disease   Crohn disease ([place])   Carotid artery disease ([place])   Essential hypertension   Dizziness-MRI negative for CVA.  But will require cardiac work-up with heart rates dropping to 30s intermittently will awake.  EKG repeat shows sinus rhythm, although does not appear prolonged, rate 47. Not on beta-blockers.  Stat troponin negative.  Denies fluid loss history, has maintained good p.o. Intake. ?hypomagnesemia contributing. -Trops x3 -Cardiogram -Admit to stepdown - Pace maker bedside -Echocardiogram -Consult cardiology still awaiting call back, recommend reconsulting cardiology in a.m -orthostatic vitals - Will Check MAg- 1.1 - TSh check  Electrolyte abnormality-hypomagnesemia- mag 1.0, mild hypokalemia K3.4. ?contribution to bradycardia. -Replete -Recheck in a.m.  CAD hx- per notes history of stenting 2000 -Repeat echo -Cont Home aspirin and Lipitor  Crohn's disease-denies fluid loss hx, maintained good oral intake. -Continue home balsalazide  HTN-blood pressure systolic widely fluctuating 107- 187.  -Continue home low-dose ramipril  DVT prophylaxis: Lovenox Code Status: Full Family Communication: None at bedside Disposition Plan: Per rounding team Consults called: Cardiology consult a.m. Admission status: We will admit to inpatient as he will be admitted to the stepdown unit for closer cardiac  monitoring   Bethena Roys MD [facility] Pager [phone] From 6PM-2AM.  Otherwise please contact night-coverage [email or web address] Password [facility]  11/30/2017, 10:55 PM

## 2017-11-30 NOTE — ED Notes (Signed)
Pt to MRI

## 2017-11-30 NOTE — ED Provider Notes (Signed)
[name] Provider Note   CSN: [id number] Arrival date & time: 11/30/17  1334     History   Chief Complaint Chief Complaint  Patient presents with  . Dizziness    HPI Colin Larson is a 83 y.o. male with PMH of CAD, Crohn's disease, hyperlipidemia, hypertension, MI, mild aortic stenosis and mitral regurgitation here for dizziness.  Patient notes that around 6 AM when he woke up from sleep she experienced dizziness.  He describes his dizziness as if the room was spinning.  He notes that he then got up to go to the bathroom and continued to feel like the room was spinning and endorsed nausea, as well as feeling hot and clammy.  Was holding onto the walls while ambulating.  He went to go lay on the couch and then his symptoms resolved.  He notes that the whole episode of dizziness was probably about 10 minutes.  He denies any falls or loss of consciousness, no chest pain, no heart palpitations.    He notes that after the episode of dizziness he developed left-sided neck pain that was intermittent in nature and dull.  He notes that that happened around 11:30 AM.    He does have deafness in his left ear and baseline hearing impairment in bilateral ears.  He does need hearing aids.  At baseline he ambulates independently without any assistance.  No facial drooping, slurred speech, muscle weakness, numbness, tingling.  No gait abnormality.  Normal appetite and no difficulty swallowing.  HPI  Past Medical History:  Diagnosis Date  . Anemia   . Arthritis   . Carotid artery disease ([place])   . Coronary artery disease    a. MI 2000 s/p BMS to prox/mid RCA, 70% lesion in abberant LCx that arises from right coronary cusp per Dr. Hassell Done note  . Crohn disease ([place])   . GI bleeding    2014 - from diverticulosis/Crohn's 2014  . Hiatal hernia    no surgery  . Hypercholesteremia   . Hypertension   . MI (myocardial infarction) ([place]) [id number]  . Mild  aortic stenosis   . Mild mitral regurgitation   . Skin cancer     Patient Active Problem List   Diagnosis Date Noted  . Elevated LFTs 08/24/2017  . Unilateral primary osteoarthritis, right knee 02/25/2017  . Chronic pain of right knee 02/25/2017  . Unilateral primary osteoarthritis, left knee 02/25/2017  . History of anemia 07/30/2016  . Carotid artery disease ([place])   . GI bleeding   . Mild aortic stenosis   . Mild mitral regurgitation   . Mixed hyperlipidemia 05/04/2013  . GERD (gastroesophageal reflux disease) 05/04/2013  . Diverticulosis of colon with hemorrhage 05/04/2013  . Coronary artery disease 05/04/2013  . Crohn disease ([place]) 05/04/2013  . Melanoma ([place]) 01/07/2012  . Essential hypertension 12/29/2011  . Hypercholesteremia 12/29/2011    Past Surgical History:  Procedure Laterality Date  . CHOLECYSTECTOMY    . CORONARY STENT PLACEMENT  [id number]  . INGUINAL HERNIA REPAIR     x 2      Home Medications    Prior to Admission medications   Medication Sig Start Date End Date Taking? Authorizing Provider  acetaminophen (TYLENOL) 500 MG tablet Take 1,000 mg by mouth every 6 (six) hours as needed for mild pain or moderate pain.   Yes [provider]  aspirin EC 81 MG tablet Take 81 mg by mouth daily.   Yes [provider]  atorvastatin (LIPITOR) 80 MG tablet Take 80 mg by mouth daily. 06/10/17  Yes [provider]  balsalazide (COLAZAL) 750 MG capsule Take 2 capsules by mouth 2 (two) times daily. 08/12/17  Yes [provider]  ketoconazole (NIZORAL) 2 % cream Apply 1 application topically daily. 11/05/17  Yes [provider]  Multiple Vitamin (MULTIVITAMIN WITH MINERALS) TABS tablet Take 1 tablet by mouth daily.   Yes [provider]  nitroGLYCERIN (NITROSTAT) 0.4 MG SL tablet Place 1 tablet (0.4 mg total) under the tongue every 5 (five) minutes as needed. For chest pain 07/31/16  Yes Dunn, Dayna N, PA-C  omeprazole  (PRILOSEC) 20 MG capsule Take 20 mg by mouth daily. 08/12/17  Yes [provider]  ramipril (ALTACE) 5 MG capsule Take 5 mg by mouth daily.   Yes [provider]  triamcinolone cream (KENALOG) 0.1 % Apply 1 application topically daily. 10/22/17  Yes [provider]    Family History Family History  Problem Relation Age of Onset  . Cancer Mother   . Stroke Father     Social History Social History   Tobacco Use  . Smoking status: Former Smoker    Packs/day: 0.14    Years: 5.00    Pack years: 0.70    Last attempt to quit: 02/15/1975    Years since quitting: 42.8  . Smokeless tobacco: Never Used  Substance Use Topics  . Alcohol use: No  . Drug use: No     Allergies   Bee venom   Review of Systems Review of Systems  Constitutional: Negative for fatigue.  HENT: Negative for congestion, ear discharge, ear pain, tinnitus and voice change.   Respiratory: Negative for cough and shortness of breath.   Cardiovascular: Negative for chest pain, palpitations and leg swelling.  Gastrointestinal: Negative for abdominal pain, constipation, diarrhea, nausea and vomiting.  Skin: Negative for rash.  Neurological: Positive for dizziness. Negative for tremors and weakness.     Physical Exam Updated Vital Signs BP (!) 171/78   Pulse (!) 50   Temp 97.8 F (36.6 C) (Oral)   Resp 17   SpO2 97%   Physical Exam  Constitutional: He is oriented to person, place, and time. He appears well-developed and well-nourished.  HENT:  Head: Normocephalic and atraumatic.  Right Ear: External ear normal.  Left Ear: External ear normal.  Mouth/Throat: Oropharynx is clear and moist.  Cerumen impaction bilaterally.  False dentures in upper and lower mouth  Eyes: Pupils are equal, round, and reactive to light. Conjunctivae are normal.  Neck: Normal range of motion. Neck supple.  Cardiovascular: Normal rate, regular rhythm and intact distal pulses.  Pulmonary/Chest: Effort  normal and breath sounds normal. No respiratory distress. He has no wheezes.  Abdominal: Soft. Bowel sounds are normal. He exhibits no distension. There is no tenderness.  Musculoskeletal: Normal range of motion. He exhibits no edema or deformity.  Neurological: He is alert and oriented to person, place, and time. He has normal strength. He displays normal reflexes. No cranial nerve deficit or sensory deficit. He exhibits normal muscle tone. He displays a negative Romberg sign. Coordination and gait normal.  Right corner of mouth does appear to be downsloping but when he smiles there is no obvious asymmetry  Skin: Skin is warm and dry. Capillary refill takes less than 2 seconds. No rash noted.  Psychiatric: He has a normal mood and affect. His behavior is normal. Judgment and thought content normal.     ED Treatments /  Results  Labs (all labs ordered are listed, but only abnormal results are displayed) Labs Reviewed  CBC - Abnormal; Notable for the following components:      Result Value   RBC 4.05 (*)    Hemoglobin 11.9 (*)    HCT 37.9 (*)    All other components within normal limits  COMPREHENSIVE METABOLIC PANEL - Abnormal; Notable for the following components:   Potassium 3.4 (*)    Calcium 8.6 (*)    All other components within normal limits  I-STAT TROPONIN, ED    EKG EKG Interpretation  Date/Time:  Monday November 30 2017 17:52:09 EDT Ventricular Rate:  47 PR Interval:  120 QRS Duration: 102 QT Interval:  457 QTC Calculation: 404 R Axis:   28 Text Interpretation:  Sinus bradycardia Borderline T abnormalities, inferior leads Confirmed by Quintella Reichert [phone]) on 11/30/2017 5:56:29 PM   Radiology Dg Chest 2 View  Result Date: 11/30/2017 CLINICAL DATA:  83 year old male with dizziness, nausea, right side neck pain. EXAM: CHEST - 2 VIEW COMPARISON:  Portable chest 11/12/2011. FINDINGS: Upright AP and lateral views. Stable cardiac size at the upper limits of normal to mildly  enlarged. Other mediastinal contours are within normal limits. Visualized tracheal air column is within normal limits. The lungs appear stable and clear. No pneumothorax or pleural effusion. Stable cholecystectomy clips. Negative visible bowel gas pattern. No acute osseous abnormality identified. IMPRESSION: No acute cardiopulmonary abnormality. Electronically Signed   By: Genevie Ann M.D.   On: 11/30/2017 14:32   Mr Brain Wo Contrast (neuro Protocol)  Result Date: 11/30/2017 CLINICAL DATA:  Initial evaluation for acute dizziness. EXAM: MRI HEAD WITHOUT CONTRAST TECHNIQUE: Multiplanar, multiecho pulse sequences of the brain and surrounding structures were obtained without intravenous contrast. COMPARISON:  None available. FINDINGS: Brain: Diffuse prominence of the CSF containing spaces compatible with generalized age-related cerebral atrophy. Mild chronic small vessel ischemic change present within the periventricular and deep white matter both cerebral hemispheres. No abnormal foci of restricted diffusion to suggest acute or subacute ischemia. Gray-white matter differentiation maintained. No areas of remote cortical encephalomalacia to suggest prior infarction. No acute intracranial hemorrhage. Scattered chronic micro hemorrhages seen throughout the left cerebral hemisphere, which may be hypertensive in nature. Sequelae of cerebral amyloid also be considered. No mass lesion, midline shift or mass effect. No hydrocephalus. No extra-axial fluid collection. Pituitary gland normal. Vascular: Major intracranial vascular flow voids maintained. Skull and upper cervical spine: Craniocervical junction normal. Upper cervical spine within normal limits. Bone marrow signal intensity normal. No scalp soft tissue abnormality. Sinuses/Orbits: Globes and orbital soft tissues within normal limits. Right maxillary sinus retention cyst noted. Paranasal sinuses are otherwise clear. Trace bilateral mastoid effusions, chronic in  appearance. Inner ear structures grossly normal. Other: None. IMPRESSION: 1. No acute intracranial abnormality. 2. Age-related cerebral atrophy with mild chronic small vessel ischemic disease. Electronically Signed   By: Jeannine Boga M.D.   On: 11/30/2017 19:30    Procedures Procedures (including critical care time)  Medications Ordered in ED Medications - No data to display   Initial Impression / Assessment and Plan / ED Course  I have reviewed the triage vital signs and the nursing notes.  Pertinent labs & imaging results that were available during my care of the patient were reviewed by me and considered in my medical decision making (see chart for details).    83 y.o. male with PMH of CAD, hypertension, MI, mild aortic stenosis and mitral regurgitation, Crohn's disease, hyperlipidemia, here for  dizziness, nausea, and intermittent left neck pain.   Patient denies any current dizziness or nausea.  Neurological exam unremarkable. History most consistent with BPPV.  MRI brain negative for CVA.  Troponin 0. Initial EKG showing normal sinus rhythm with RBBB.   Patient now bradycardic into the 30s and 40s.  Repeat EKG showing sinus bradycardia with a rate of 47.  Unclear etiology at this time, patient was asymptomatic.  He remains intermittently bradycardic into the 40's and rarely 30's. Remains intermittently hypertensive up to 171/78.  Given patient's history of dizziness and left sided neck pain, concerned that bradycardia may be contributing. ?presyncopal symptoms. Will call for admission for observation for potential syncopal work up and bradycardia monitoring. Appreciate hospitalist assistance.   Final Clinical Impressions(s) / ED Diagnoses   Final diagnoses:  Dizziness  Bradycardia    ED Discharge Orders    None       Carlyle Dolly, MD 11/30/17 2118    Quintella Reichert, MD 12/02/17 1334

## 2017-11-30 NOTE — ED Triage Notes (Signed)
Pt states he woke up this morning with dizziness. He states he had 2 episodes of dizziness and nausea. Pt denies at this time. Pt also reports some left side neck pain. Hx of 50% blockage in his carotid. Pt alert and oriented in triage.

## 2017-11-30 NOTE — ED Notes (Signed)
Pt returned from MRI °

## 2017-12-01 ENCOUNTER — Inpatient Hospital Stay (HOSPITAL_COMMUNITY): Payer: Medicare Other

## 2017-12-01 DIAGNOSIS — I340 Nonrheumatic mitral (valve) insufficiency: Secondary | ICD-10-CM

## 2017-12-01 DIAGNOSIS — R001 Bradycardia, unspecified: Secondary | ICD-10-CM | POA: Diagnosis present

## 2017-12-01 LAB — MAGNESIUM
Magnesium: 1 mg/dL — ABNORMAL LOW (ref 1.7–2.4)
Magnesium: 1.4 mg/dL — ABNORMAL LOW (ref 1.7–2.4)

## 2017-12-01 LAB — BASIC METABOLIC PANEL
Anion gap: 8 (ref 5–15)
BUN: 10 mg/dL (ref 6–20)
CALCIUM: 8.4 mg/dL — AB (ref 8.9–10.3)
CO2: 29 mmol/L (ref 22–32)
Chloride: 107 mmol/L (ref 101–111)
Creatinine, Ser: 0.92 mg/dL (ref 0.61–1.24)
GFR calc Af Amer: >60 mL/min (ref >60)
GFR calc non Af Amer: >60 mL/min (ref >60)
GLUCOSE: 73 mg/dL (ref 65–99)
Potassium: 3.3 mmol/L — ABNORMAL LOW (ref 3.5–5.1)
Sodium: 144 mmol/L (ref 135–145)

## 2017-12-01 LAB — TROPONIN I: Troponin I: <0.03 ng/mL (ref <0.03)

## 2017-12-01 LAB — ECHOCARDIOGRAM COMPLETE: WEIGHTICAEL: 2272 [oz_av]

## 2017-12-01 MED ORDER — ASPIRIN EC 81 MG PO TBEC
81.0000 mg | DELAYED_RELEASE_TABLET | Freq: Every day | ORAL | Status: DC
  Administered 2017-12-01 – 2017-12-02 (×2): 81 mg via ORAL
  Filled 2017-12-01 (×2): qty 1

## 2017-12-01 MED ORDER — ATORVASTATIN CALCIUM 80 MG PO TABS
80.0000 mg | ORAL_TABLET | Freq: Every day | ORAL | Status: DC
  Administered 2017-12-01 – 2017-12-02 (×2): 80 mg via ORAL
  Filled 2017-12-01 (×2): qty 1

## 2017-12-01 MED ORDER — BALSALAZIDE DISODIUM 750 MG PO CAPS
1500.0000 mg | ORAL_CAPSULE | Freq: Two times a day (BID) | ORAL | Status: DC
  Administered 2017-12-01 – 2017-12-02 (×3): 1500 mg via ORAL
  Filled 2017-12-01 (×4): qty 2

## 2017-12-01 MED ORDER — PANTOPRAZOLE SODIUM 40 MG PO TBEC
40.0000 mg | DELAYED_RELEASE_TABLET | Freq: Every day | ORAL | Status: DC
  Administered 2017-12-01 – 2017-12-02 (×2): 40 mg via ORAL
  Filled 2017-12-01 (×2): qty 1

## 2017-12-01 MED ORDER — RAMIPRIL 2.5 MG PO CAPS
5.0000 mg | ORAL_CAPSULE | Freq: Every day | ORAL | Status: DC
  Administered 2017-12-02: 5 mg via ORAL
  Filled 2017-12-01 (×2): qty 1
  Filled 2017-12-01 (×2): qty 2

## 2017-12-01 MED ORDER — ENOXAPARIN SODIUM 40 MG/0.4ML ~~LOC~~ SOLN
40.0000 mg | SUBCUTANEOUS | Status: DC
  Administered 2017-12-01: 40 mg via SUBCUTANEOUS
  Filled 2017-12-01: qty 0.4

## 2017-12-01 MED ORDER — MAGNESIUM SULFATE 4 GM/100ML IV SOLN
4.0000 g | Freq: Once | INTRAVENOUS | Status: AC
  Administered 2017-12-01: 4 g via INTRAVENOUS
  Filled 2017-12-01: qty 100

## 2017-12-01 MED ORDER — POTASSIUM CHLORIDE CRYS ER 20 MEQ PO TBCR
40.0000 meq | EXTENDED_RELEASE_TABLET | Freq: Once | ORAL | Status: AC
  Administered 2017-12-01: 40 meq via ORAL
  Filled 2017-12-01: qty 2

## 2017-12-01 MED ORDER — MAGNESIUM SULFATE 2 GM/50ML IV SOLN
2.0000 g | Freq: Once | INTRAVENOUS | Status: AC
  Administered 2017-12-01: 2 g via INTRAVENOUS
  Filled 2017-12-01: qty 50

## 2017-12-01 NOTE — ED Notes (Signed)
Patient's family wanted to know if patient can go home. MD paged

## 2017-12-01 NOTE — ED Notes (Signed)
Admitting paged to [facility] regarding patient wanting to leave per her request

## 2017-12-01 NOTE — Progress Notes (Signed)
[facility] TEAM 1 - Stepdown/ICU TEAM  Colin Larson  DGL:[id number] DOB: [birth year] DOA: 11/30/2017 PCP: Mayra Neer, MD    Brief Narrative:  83 y.o. male with a hx of HTN, coronary and carotid artery disease, and Crohn's disease who presented with the acute onset of dizziness / the room spinning.    In the ED he was found to have a HR of 39-60/  MRI brain negative for acute abnormality but noted age-related cerebral atrophy.  Hemoglobin stable 11.1, sodium 143.   Significant Events: 6/17 admit   Subjective: Resting comfortably in bed.  No more dizzy spells since HR improved into 60-70 range.  Denies cp, n/v, sob, HA, or abdom pain.    Assessment & Plan:  Dizziness - ?Symptomatic sinus bradycardia On no obvious offending meds - HR has improved for now - possibly related to K+ and Mg+ deficiencies - follow on tele - r/o structural heart disease w/ TTE / evaluate known AoS - replete lytes - follow   Severe Hypomagnesemia Cont to supplement to goal of 2.0  Hypokalemia  Cont to supplement to goal of 4.0  Hx of CAD MI 2000 s/p BMS to prox/mid RCA, 70% lesion in abberant LCx that arises from right coronary cusp - no active sx presently   Mild AoS Mild M on exam - doubt progression to severe AoS in just over a year but TTE pending to check   Crohn's disease Appears to be well controlled at this time, but pt does have 3-4 voluminous loose stools per day (his normal) - suspect this is etiology of his lyte abnormalities   Normocytic anemia  Likely related to Crohn's - check anemia panel in AM   HTN BP variable - follow w/o change for now   DVT prophylaxis: lovenox  Code Status: FULL CODE Family Communication: spoke at length w/ wife and daughter at bedside   Disposition Plan: downgrade to tele bed - replace lytes - f/u TTE - monitor on tele - if HR remains normal w/ correction of lytes and AoS not worse may be able to d/c home 6/19 w/ outpt f/u and on K/Mg  supplementation   Consultants:  none  Antimicrobials:  none   Objective: Blood pressure (!) 142/72, pulse 62, temperature 97.8 F (36.6 C), temperature source Oral, resp. rate 16, weight 64.4 kg (142 lb), SpO2 99 %.  Intake/Output Summary (Last 24 hours) at 12/01/2017 1434 Last data filed at 12/01/2017 0512 Gross per 24 hour  Intake -  Output 400 ml  Net -400 ml   Filed Weights   12/01/17 0100  Weight: 64.4 kg (142 lb)    Examination: General: No acute respiratory distress Lungs: Clear to auscultation bilaterally without wheezes or crackles Cardiovascular: Regular rate and rhythm with 2/6 holosystolic M  Abdomen: Nontender, nondistended, soft, bowel sounds positive, no rebound, no ascites, no appreciable mass Extremities: No significant cyanosis, clubbing, or edema bilateral lower extremities  CBC: Recent Labs  Lab 11/30/17 1353  WBC 5.9  HGB 11.9*  HCT 37.9*  MCV 93.6  PLT 518   Basic Metabolic Panel: Recent Labs  Lab 11/30/17 1353 12/01/17 0109 12/01/17 0700  NA 143  --  144  K 3.4*  --  3.3*  CL 106  --  107  CO2 29  --  29  GLUCOSE 85  --  73  BUN 11  --  10  CREATININE 0.97  --  0.92  CALCIUM 8.6*  --  8.4*  MG  --  1.0* 1.4*   GFR: Estimated Creatinine Clearance: 58.3 mL/min (by C-G formula based on SCr of 0.92 mg/dL).  Liver Function Tests: Recent Labs  Lab 11/30/17 1353  AST 34  ALT 22  ALKPHOS 56  BILITOT 0.6  PROT 6.6  ALBUMIN 3.5    Cardiac Enzymes: Recent Labs  Lab 12/01/17 0109 12/01/17 0700 12/01/17 1148  TROPONINI <0.03 <0.03 <0.03    Scheduled Meds: . aspirin EC  81 mg Oral Daily  . atorvastatin  80 mg Oral Daily  . balsalazide  1,500 mg Oral BID  . enoxaparin (LOVENOX) injection  40 mg Subcutaneous Q24H  . pantoprazole  40 mg Oral Daily  . ramipril  5 mg Oral Daily     LOS: 1 day   Cherene Altes, MD [facility] Office  [phone] Pager - Text Page per Amion as per below:  On-Call/Text Page:       [email or web address]      password [email or web address]  If 7PM-7AM, please contact night-coverage [email or web address] Password [facility] 12/01/2017, 2:34 PM

## 2017-12-01 NOTE — ED Notes (Signed)
Pt and family members agitated, due to lack of communication from admitting MDs. Tawanna Solo - RN aware.

## 2017-12-01 NOTE — ED Notes (Signed)
Heart Healthy diet lunch tray ordered.  

## 2017-12-01 NOTE — ED Notes (Signed)
Pt's lunch tray arrived. 

## 2017-12-01 NOTE — ED Notes (Signed)
Admitting MD at bedside.

## 2017-12-01 NOTE — Progress Notes (Signed)
  Echocardiogram 2D Echocardiogram has been performed.  Colin Larson 12/01/2017, 3:12 PM

## 2017-12-01 NOTE — ED Notes (Addendum)
Charted in error.

## 2017-12-02 ENCOUNTER — Encounter (HOSPITAL_COMMUNITY): Payer: Self-pay | Admitting: *Deleted

## 2017-12-02 ENCOUNTER — Other Ambulatory Visit: Payer: Self-pay

## 2017-12-02 DIAGNOSIS — R42 Dizziness and giddiness: Secondary | ICD-10-CM

## 2017-12-02 DIAGNOSIS — I10 Essential (primary) hypertension: Secondary | ICD-10-CM

## 2017-12-02 DIAGNOSIS — R001 Bradycardia, unspecified: Principal | ICD-10-CM

## 2017-12-02 LAB — FOLATE: Folate: 14.5 ng/mL (ref >5.9)

## 2017-12-02 LAB — BASIC METABOLIC PANEL
ANION GAP: 4 — AB (ref 5–15)
Anion gap: 6 (ref 5–15)
BUN: 13 mg/dL (ref 6–20)
BUN: 11 mg/dL (ref 6–20)
CHLORIDE: 104 mmol/L (ref 101–111)
CO2: 30 mmol/L (ref 22–32)
CO2: 28 mmol/L (ref 22–32)
CREATININE: 0.98 mg/dL (ref 0.61–1.24)
Calcium: 8.6 mg/dL — ABNORMAL LOW (ref 8.9–10.3)
Calcium: 8.5 mg/dL — ABNORMAL LOW (ref 8.9–10.3)
Chloride: 108 mmol/L (ref 101–111)
Creatinine, Ser: 0.91 mg/dL (ref 0.61–1.24)
GFR calc Af Amer: >60 mL/min (ref >60)
GFR calc non Af Amer: >60 mL/min (ref >60)
GFR calc non Af Amer: >60 mL/min (ref >60)
GLUCOSE: 81 mg/dL (ref 65–99)
Glucose, Bld: 119 mg/dL — ABNORMAL HIGH (ref 65–99)
POTASSIUM: 4 mmol/L (ref 3.5–5.1)
Potassium: 3.7 mmol/L (ref 3.5–5.1)
Sodium: 140 mmol/L (ref 135–145)
Sodium: 140 mmol/L (ref 135–145)

## 2017-12-02 LAB — TSH: TSH: 53.709 u[IU]/mL — ABNORMAL HIGH (ref 0.350–4.500)

## 2017-12-02 LAB — FERRITIN: Ferritin: 30 ng/mL (ref 24–336)

## 2017-12-02 LAB — CBC
HEMATOCRIT: 37.1 % — AB (ref 39.0–52.0)
HEMOGLOBIN: 12 g/dL — AB (ref 13.0–17.0)
MCH: 29.8 pg (ref 26.0–34.0)
MCHC: 32.3 g/dL (ref 30.0–36.0)
MCV: 92.1 fL (ref 78.0–100.0)
Platelets: 189 10*3/uL (ref 150–400)
RBC: 4.03 MIL/uL — ABNORMAL LOW (ref 4.22–5.81)
RDW: 13.7 % (ref 11.5–15.5)
WBC: 5.2 10*3/uL (ref 4.0–10.5)

## 2017-12-02 LAB — IRON AND TIBC
Iron: 79 ug/dL (ref 45–182)
SATURATION RATIOS: 23 % (ref 17.9–39.5)
TIBC: 343 ug/dL (ref 250–450)
UIBC: 264 ug/dL

## 2017-12-02 LAB — VITAMIN B12: Vitamin B-12: 197 pg/mL (ref 180–914)

## 2017-12-02 LAB — RETICULOCYTES
RBC.: 4.03 MIL/uL — AB (ref 4.22–5.81)
RETIC CT PCT: 1.4 % (ref 0.4–3.1)
Retic Count, Absolute: 56.4 10*3/uL (ref 19.0–186.0)

## 2017-12-02 LAB — MAGNESIUM
Magnesium: 2.5 mg/dL — ABNORMAL HIGH (ref 1.7–2.4)
Magnesium: 2.1 mg/dL (ref 1.7–2.4)

## 2017-12-02 NOTE — Progress Notes (Signed)
Discharged home by wheelchair, accompanied by wife. Discharge instructions given , belongings taken home.

## 2017-12-02 NOTE — Discharge Summary (Signed)
Physician Discharge Summary  Colin Larson [id number]:[id number] DOB: 1934/08/20 DOA: 11/30/2017  PCP: Mayra Neer, MD  Admit date: 11/30/2017 Discharge date: 12/02/2017  Admitted From: Home Disposition:  Home  Recommendations for Outpatient Follow-up:  1. Follow up with PCP in 1-2 weeks 2. Recommend repeat BMET and magnesium in 1-2 weeks  Discharge Condition:Stable CODE STATUS:Full Diet recommendation: Regular   Brief/Interim Summary: 83 y.o.malewith a hx of HTN,coronary and carotidartery disease,and Crohn's disease who presented with the acute onset of dizziness / the room spinning.  In the ED he was found to have a HR of 39-60/  MRI brain negative for acute abnormality but noted age-related cerebral atrophy.Hemoglobin stable 11.1,sodium 143.  Dizziness - ?Symptomatic sinus bradycardia On no obvious offending meds - HR has improved for now - possibly related to K+ and Mg+ deficiencies  -Remained stable on tele -Mild/mod AS on 2d echo   Severe Hypomagnesemia -Magnesium corrected from 1.0 to 2.1  Hypokalemia  -Potassium corrected to 4.0  Hx of CAD -MI 2000 s/p BMS to prox/mid RCA, 70% lesion in abberant LCx that arises from right coronary cusp - no active sx presently   Mild AoS -noted on 2d echo, reviewed  Crohn's disease Appears to be well controlled at this time, but pt does have 3-4 voluminous loose stools per day (his normal) -Tolerating regular diet  Normocytic anemia  Likely related to Crohn's -Stable  HTN BP variable  -Cont current regimen -Recommend close outpatient f/u   Discharge Diagnoses:  Principal Problem:   Dizziness Active Problems:   Coronary artery disease   Crohn disease ([place])   Carotid artery disease ([place])   Essential hypertension   Bradycardia    Discharge Instructions   Allergies as of 12/02/2017      Reactions   Bee Venom Anaphylaxis, Swelling      Medication List    STOP taking these medications    ketoconazole 2 % cream Commonly known as:  NIZORAL   nitroGLYCERIN 0.4 MG SL tablet Commonly known as:  NITROSTAT   triamcinolone cream 0.1 % Commonly known as:  KENALOG     TAKE these medications   acetaminophen 500 MG tablet Commonly known as:  TYLENOL Take 1,000 mg by mouth every 6 (six) hours as needed for mild pain or moderate pain.   aspirin EC 81 MG tablet Take 81 mg by mouth daily.   atorvastatin 80 MG tablet Commonly known as:  LIPITOR Take 80 mg by mouth daily.   balsalazide 750 MG capsule Commonly known as:  COLAZAL Take 2 capsules by mouth 2 (two) times daily.   multivitamin with minerals Tabs tablet Take 1 tablet by mouth daily.   omeprazole 20 MG capsule Commonly known as:  PRILOSEC Take 20 mg by mouth daily.   ramipril 5 MG capsule Commonly known as:  ALTACE Take 5 mg by mouth daily.       Allergies  Allergen Reactions  . Bee Venom Anaphylaxis and Swelling    Procedures/Studies: Dg Chest 2 View  Result Date: 11/30/2017 CLINICAL DATA:  83 year old male with dizziness, nausea, right side neck pain. EXAM: CHEST - 2 VIEW COMPARISON:  Portable chest 11/12/2011. FINDINGS: Upright AP and lateral views. Stable cardiac size at the upper limits of normal to mildly enlarged. Other mediastinal contours are within normal limits. Visualized tracheal air column is within normal limits. The lungs appear stable and clear. No pneumothorax or pleural effusion. Stable cholecystectomy clips. Negative visible bowel gas pattern. No acute osseous abnormality identified. IMPRESSION:  No acute cardiopulmonary abnormality. Electronically Signed   By: Genevie Ann M.D.   On: 11/30/2017 14:32   Mr Brain Wo Contrast (neuro Protocol)  Result Date: 11/30/2017 CLINICAL DATA:  Initial evaluation for acute dizziness. EXAM: MRI HEAD WITHOUT CONTRAST TECHNIQUE: Multiplanar, multiecho pulse sequences of the brain and surrounding structures were obtained without intravenous contrast.  COMPARISON:  None available. FINDINGS: Brain: Diffuse prominence of the CSF containing spaces compatible with generalized age-related cerebral atrophy. Mild chronic small vessel ischemic change present within the periventricular and deep white matter both cerebral hemispheres. No abnormal foci of restricted diffusion to suggest acute or subacute ischemia. Gray-white matter differentiation maintained. No areas of remote cortical encephalomalacia to suggest prior infarction. No acute intracranial hemorrhage. Scattered chronic micro hemorrhages seen throughout the left cerebral hemisphere, which may be hypertensive in nature. Sequelae of cerebral amyloid also be considered. No mass lesion, midline shift or mass effect. No hydrocephalus. No extra-axial fluid collection. Pituitary gland normal. Vascular: Major intracranial vascular flow voids maintained. Skull and upper cervical spine: Craniocervical junction normal. Upper cervical spine within normal limits. Bone marrow signal intensity normal. No scalp soft tissue abnormality. Sinuses/Orbits: Globes and orbital soft tissues within normal limits. Right maxillary sinus retention cyst noted. Paranasal sinuses are otherwise clear. Trace bilateral mastoid effusions, chronic in appearance. Inner ear structures grossly normal. Other: None. IMPRESSION: 1. No acute intracranial abnormality. 2. Age-related cerebral atrophy with mild chronic small vessel ischemic disease. Electronically Signed   By: Jeannine Boga M.D.   On: 11/30/2017 19:30    Subjective: Eager to go home today  Discharge Exam: Vitals:   12/02/17 0342 12/02/17 0756  BP: (!) 140/54 (!) 151/95  Pulse: (!) 55   Resp: 15 (!) 22  Temp: 97.6 F (36.4 C) 97.9 F (36.6 C)  SpO2:     Vitals:   12/01/17 1941 12/01/17 2325 12/02/17 0342 12/02/17 0756  BP: (!) 152/92 (!) 162/68 (!) 140/54 (!) 151/95  Pulse: (!) 57 62 (!) 55   Resp: 18 (!) 21 15 (!) 22  Temp: 97.7 F (36.5 C) 97.7 F (36.5 C)  97.6 F (36.4 C) 97.9 F (36.6 C)  TempSrc: Oral Oral Oral Oral  SpO2: 96% 96%    Weight:      Height:        General: Pt is alert, awake, not in acute distress Cardiovascular: RRR, S1/S2 +, no rubs, no gallops Respiratory: CTA bilaterally, no wheezing, no rhonchi Abdominal: Soft, NT, ND, bowel sounds + Extremities: no edema, no cyanosis   The results of significant diagnostics from this hospitalization (including imaging, microbiology, ancillary and laboratory) are listed below for reference.     Microbiology: No results found for this or any previous visit (from the past 240 hour(s)).   Labs: BNP (last 3 results) No results for input(s): BNP in the last 8760 hours. Basic Metabolic Panel: Recent Labs  Lab 11/30/17 1353 12/01/17 0109 12/01/17 0700 12/01/17 2318 12/02/17 0555  NA 143  --  144 140 140  K 3.4*  --  3.3* 3.7 4.0  CL 106  --  107 104 108  CO2 29  --  29 30 28   GLUCOSE 85  --  73 119* 81  BUN 11  --  10 13 11   CREATININE 0.97  --  0.92 0.98 0.91  CALCIUM 8.6*  --  8.4* 8.6* 8.5*  MG  --  1.0* 1.4* 2.5* 2.1   Liver Function Tests: Recent Labs  Lab 11/30/17 1353  AST  34  ALT 22  ALKPHOS 56  BILITOT 0.6  PROT 6.6  ALBUMIN 3.5   No results for input(s): LIPASE, AMYLASE in the last 168 hours. No results for input(s): AMMONIA in the last 168 hours. CBC: Recent Labs  Lab 11/30/17 1353 12/02/17 0555  WBC 5.9 5.2  HGB 11.9* 12.0*  HCT 37.9* 37.1*  MCV 93.6 92.1  PLT 202 189   Cardiac Enzymes: Recent Labs  Lab 12/01/17 0109 12/01/17 0700 12/01/17 1148  TROPONINI <0.03 <0.03 <0.03   BNP: Invalid input(s): POCBNP CBG: No results for input(s): GLUCAP in the last 168 hours. D-Dimer No results for input(s): DDIMER in the last 72 hours. Hgb A1c No results for input(s): HGBA1C in the last 72 hours. Lipid Profile No results for input(s): CHOL, HDL, LDLCALC, TRIG, CHOLHDL, LDLDIRECT in the last 72 hours. Thyroid function studies Recent  Labs    12/01/17 2318  TSH 53.709*   Anemia work up Recent Labs    12/02/17 0555  VITAMINB12 197  FOLATE 14.5  FERRITIN 30  TIBC 343  IRON 79  RETICCTPCT 1.4   Urinalysis No results found for: COLORURINE, APPEARANCEUR, LABSPEC, [place], GLUCOSEU, HGBUR, BILIRUBINUR, KETONESUR, PROTEINUR, UROBILINOGEN, NITRITE, LEUKOCYTESUR Sepsis Labs Invalid input(s): PROCALCITONIN,  WBC,  LACTICIDVEN Microbiology No results found for this or any previous visit (from the past 240 hour(s)).  Time spent: 1mn  SIGNED:   SMarylu Lund MD  [facility] 12/02/2017, 9:33 AM  If 7PM-7AM, please contact night-coverage [email or web address] Password [email or web address]

## 2017-12-02 NOTE — Evaluation (Signed)
Physical Therapy Evaluation and Discharge Patient Details Name: Colin Larson MRN: [id number] DOB: 1935/05/02 Today's Date: 12/02/2017   History of Present Illness  83 y.o. male with a hx of HTN, coronary and carotid artery disease, and Crohn's disease who presented with the acute onset of dizziness / the room spinning. In ED patient found to have HR 39-60.  Clinical Impression  Pt admitted with above. Pt denies dizziness with all mobility. Pt functioning at baseline and demonstrates decreased falls risk as indicated by score of 22/24 on DGI. Pt with no further acute PT needs at this time. PT SIGNING OFF. Please reconsult if needed in future. Thank you.    Follow Up Recommendations No PT follow up;Supervision - Intermittent    Equipment Recommendations  None recommended by PT    Recommendations for Other Services       Precautions / Restrictions Precautions Precautions: None Restrictions Weight Bearing Restrictions: No      Mobility  Bed Mobility Overal bed mobility: Modified Independent             General bed mobility comments: HOB elevated, no use of bed rails or physical assistance needed  Transfers Overall transfer level: Independent Equipment used: None             General transfer comment: no difficulty  Ambulation/Gait Ambulation/Gait assistance: Modified independent (Device/Increase time) Gait Distance (Feet): 500 Feet Assistive device: None Gait Pattern/deviations: WFL(Within Functional Limits) Gait velocity: wfl Gait velocity interpretation: >4.37 ft/sec, indicative of normal walking speed General Gait Details: no episodes of LOB  Stairs Stairs: Yes Stairs assistance: Min guard Stair Management: One rail Right;Step to pattern;Forwards Number of Stairs: 8 General stair comments: no difficulty  Wheelchair Mobility    Modified Rankin (Stroke Patients Only)       Balance Overall balance assessment: Mild deficits observed, not  formally tested                               Standardized Balance Assessment Standardized Balance Assessment : Dynamic Gait Index   Dynamic Gait Index Level Surface: Normal Change in Gait Speed: Mild Impairment Gait with Horizontal Head Turns: Normal Gait with Vertical Head Turns: Normal Gait and Pivot Turn: Normal Step Over Obstacle: Normal Step Around Obstacles: Normal Steps: Mild Impairment Total Score: 22       Pertinent Vitals/Pain Pain Assessment: No/denies pain    Home Living Family/patient expects to be discharged to:: Private residence Living Arrangements: Spouse/significant other Available Help at Discharge: Family;Available 24 hours/day Type of Home: House Home Access: Stairs to enter Entrance Stairs-Rails: Can reach both Entrance Stairs-Number of Steps: 3 Home Layout: One level Home Equipment: None      Prior Function Level of Independence: Independent         Comments: pt still drives     Hand Dominance   Dominant Hand: Right    Extremity/Trunk Assessment   Upper Extremity Assessment Upper Extremity Assessment: Overall WFL for tasks assessed    Lower Extremity Assessment Lower Extremity Assessment: Overall WFL for tasks assessed    Cervical / Trunk Assessment Cervical / Trunk Assessment: Kyphotic  Communication   Communication: No difficulties  Cognition Arousal/Alertness: Awake/alert Behavior During Therapy: WFL for tasks assessed/performed Overall Cognitive Status: Within Functional Limits for tasks assessed  General Comments General comments (skin integrity, edema, etc.): VSS, HR >100 t/o ambulation, SpO2 >93% on RA    Exercises     Assessment/Plan    PT Assessment Patent does not need any further PT services  PT Problem List         PT Treatment Interventions      PT Goals (Current goals can be found in the Care Plan section)  Acute Rehab PT  Goals Patient Stated Goal: home PT Goal Formulation: All assessment and education complete, DC therapy Time For Goal Achievement: 12/09/17 Potential to Achieve Goals: Good    Frequency     Barriers to discharge        Co-evaluation               AM-PAC PT "6 Clicks" Daily Activity  Outcome Measure Difficulty turning over in bed (including adjusting bedclothes, sheets and blankets)?: None Difficulty moving from lying on back to sitting on the side of the bed? : None Difficulty sitting down on and standing up from a chair with arms (e.g., wheelchair, bedside commode, etc,.)?: None Help needed moving to and from a bed to chair (including a wheelchair)?: None Help needed walking in hospital room?: None Help needed climbing 3-5 steps with a railing? : A Little 6 Click Score: 23    End of Session   Activity Tolerance: Patient tolerated treatment well Patient left: in bed;with call bell/phone within reach;with family/visitor present Nurse Communication: Mobility status PT Visit Diagnosis: Dizziness and giddiness (R42)    Time: 3435-6861 PT Time Calculation (min) (ACUTE ONLY): 16 min   Charges:   PT Evaluation $PT Eval Low Complexity: 1 Low     PT G CodesKittie Larson, PT, DPT Pager #: [phone] Office #: [phone]   [place] 12/02/2017, 11:32 AM

## 2017-12-03 NOTE — Consult Note (Signed)
            [facility] CM Primary Care Navigator  12/03/2017  Colin Larson 17-Jun-1934 [id number]   Went to seepatient at the bedsideto identify possible discharge needs but he was alreadydischargedhomeper staff report.  Per chart review, patient was admittedfor dizziness and bradycardia (hypokalemia and hypomagnesemia). . Primary care provider's officeis listed asprovidingtransition of care (TOC) follow-up.  Patient has discharge instruction to follow-up withprimary care provider in 1- 2 weeks.   For additional questions please contact:  Edwena Felty A. Alixander Rallis, BSN, RN-BC [facility] PRIMARY CARE Navigator Cell: [phone]

## 2017-12-16 DIAGNOSIS — R42 Dizziness and giddiness: Secondary | ICD-10-CM | POA: Diagnosis not present

## 2017-12-16 DIAGNOSIS — R001 Bradycardia, unspecified: Secondary | ICD-10-CM | POA: Diagnosis not present

## 2017-12-16 DIAGNOSIS — E876 Hypokalemia: Secondary | ICD-10-CM | POA: Diagnosis not present

## 2017-12-16 DIAGNOSIS — I119 Hypertensive heart disease without heart failure: Secondary | ICD-10-CM | POA: Diagnosis not present

## 2017-12-16 DIAGNOSIS — I25119 Atherosclerotic heart disease of native coronary artery with unspecified angina pectoris: Secondary | ICD-10-CM | POA: Diagnosis not present

## 2017-12-22 DIAGNOSIS — K219 Gastro-esophageal reflux disease without esophagitis: Secondary | ICD-10-CM | POA: Diagnosis not present

## 2017-12-22 DIAGNOSIS — K222 Esophageal obstruction: Secondary | ICD-10-CM | POA: Diagnosis not present

## 2017-12-22 DIAGNOSIS — K5090 Crohn's disease, unspecified, without complications: Secondary | ICD-10-CM | POA: Diagnosis not present

## 2017-12-22 DIAGNOSIS — R634 Abnormal weight loss: Secondary | ICD-10-CM | POA: Diagnosis not present

## 2017-12-22 DIAGNOSIS — R197 Diarrhea, unspecified: Secondary | ICD-10-CM | POA: Diagnosis not present

## 2017-12-29 DIAGNOSIS — H2513 Age-related nuclear cataract, bilateral: Secondary | ICD-10-CM | POA: Diagnosis not present

## 2018-01-01 DIAGNOSIS — R197 Diarrhea, unspecified: Secondary | ICD-10-CM | POA: Diagnosis not present

## 2018-01-01 DIAGNOSIS — K5090 Crohn's disease, unspecified, without complications: Secondary | ICD-10-CM | POA: Diagnosis not present

## 2018-01-04 DIAGNOSIS — K5090 Crohn's disease, unspecified, without complications: Secondary | ICD-10-CM | POA: Diagnosis not present

## 2018-01-06 NOTE — Progress Notes (Signed)
Cardiology Office Note   Date:  01/07/2018   ID:  BRETT DARKO, DOB August 29, 1934, MRN [id number]  PCP:  Mayra Neer, MD    No chief complaint on file.  CAD  Wt Readings from Last 3 Encounters:  01/07/18 147 lb (66.7 kg)  12/01/17 144 lb 13.5 oz (65.7 kg)  08/24/17 149 lb 12.8 oz (67.9 kg)       History of Present Illness: Colin Larson is a 83 y.o. male  with history ofCAD (MI 2000 s/p BMS to prox/mid RCA, 70% lesion in abberant LCx that arises from right coronary cusp), carotid disease, mild AS, HTN, HLD, arthritis, Crohn's disease, hiatal hernia, GIB (from diverticulosis/Crohn's 2014), melanoma of the skin who presents for yearly routine follow-up. Last echo 2014: mild LVH, EF 60-65%, mild AS, mild MR. Carotid duplex 07/2016: 44-03% RICA, 4-74% LICA, >25% RECA. Labs 2016: CMET unremarkable except Na 135 (Cr 1.06, LFTs wnl), LDL 64, trig 114. Prior CBC in 2014 showed Hgb 10.4.  Elevated LFTs in the past when taking more tylenol for back pain.  He had an episode of dizziness, HTN which prompted visit to the ER.  Stroke ruled out.  Potassium and Magnesium were low.  MRI negative. HR noted to drop to 39 bpm.  Echo showed: Left ventricle: The cavity size was normal. There was moderate   concentric hypertrophy. Systolic function was vigorous. The   estimated ejection fraction was in the range of 65% to 70%. Wall   motion was normal; there were no regional wall motion   abnormalities. Doppler parameters are consistent with abnormal   left ventricular relaxation (grade 1 diastolic dysfunction). The   E/e&' ratio is >15, suggesting elevated LV filling pressure. - Aortic valve: Mildly calcified leaflets. Mild to moderate   stenosis. Mean gradient (S): 10 mm Hg. Peak gradient (S): 27 mm   Hg. Valve area (VTI): 1.31 cm^2. Valve area (Vmax): 1.09 cm^2.   Valve area (Vmean): 1.24 cm^2. - Mitral valve: Mildly thickened leaflets . There was mild   regurgitation. - Left  atrium: The atrium was mildly dilated. - Inferior vena cava: The vessel was dilated. The respirophasic   diameter changes were blunted (< 50%), consistent with elevated   central venous pressure.  Impressions:  - Compared to a prior study in 2018, the LVEF is higher at 65-70%.   There is mild to moderate aortic stenosis (the aortic doppler may   be undersampled).  Denies : Chest pain.  Leg edema. Nitroglycerin use. Orthopnea. Palpitations. Paroxysmal nocturnal dyspnea. Shortness of breath. Syncope.     Past Medical History:  Diagnosis Date  . Anemia   . Arthritis   . Carotid artery disease ([place])   . Coronary artery disease    a. MI 2000 s/p BMS to prox/mid RCA, 70% lesion in abberant LCx that arises from right coronary cusp per Dr. Hassell Done note  . Crohn disease ([place])   . GI bleeding    2014 - from diverticulosis/Crohn's 2014  . Hiatal hernia    no surgery  . Hypercholesteremia   . Hypertension   . MI (myocardial infarction) ([place]) [id number]  . Mild aortic stenosis   . Mild mitral regurgitation   . Skin cancer     Past Surgical History:  Procedure Laterality Date  . CHOLECYSTECTOMY    . CORONARY STENT PLACEMENT  [id number]  . INGUINAL HERNIA REPAIR     x 2     Current Outpatient Medications  Medication Sig Dispense  Refill  . acetaminophen (TYLENOL) 500 MG tablet Take 1,000 mg by mouth every 6 (six) hours as needed for mild pain or moderate pain.    Marland Kitchen aspirin EC 81 MG tablet Take 81 mg by mouth daily.    Marland Kitchen atorvastatin (LIPITOR) 80 MG tablet Take 80 mg by mouth daily.    . balsalazide (COLAZAL) 750 MG capsule Take 2 capsules by mouth 2 (two) times daily.    . Multiple Vitamin (MULTIVITAMIN WITH MINERALS) TABS tablet Take 1 tablet by mouth daily.    Marland Kitchen omeprazole (PRILOSEC) 20 MG capsule Take 20 mg by mouth daily.    . ramipril (ALTACE) 5 MG capsule Take 5 mg by mouth daily.    Marland Kitchen triamcinolone cream (KENALOG) 0.5 % Apply 1 application topically 3 (three) times daily  as needed.    . triamcinolone lotion (KENALOG) 0.1 % Apply 1 application topically 3 (three) times daily as needed.     No current facility-administered medications for this visit.     Allergies:   Bee venom    Social History:  The patient  reports that he quit smoking about 42 years ago. He has a 0.70 pack-year smoking history. He has never used smokeless tobacco. He reports that he does not drink alcohol or use drugs.   Family History:  The patient's family history includes Cancer in his mother; Stroke in his father.    ROS:  Please see the history of present illness.   Otherwise, review of systems are positive for dizziness as above.   All other systems are reviewed and negative.    PHYSICAL EXAM: VS:  BP 124/78   Pulse 65   Ht 5' 5"  (1.651 m)   Wt 147 lb (66.7 kg)   SpO2 99%   BMI 24.46 kg/m  , BMI Body mass index is 24.46 kg/m. GEN: Well nourished, well developed, in no acute distress  HEENT: normal  Neck: no JVD, carotid bruits, or masses Cardiac: RRR; no murmurs, rubs, or gallops,no edema  Respiratory:  clear to auscultation bilaterally, normal work of breathing GI: soft, nontender, nondistended, + BS MS: no deformity or atrophy ; 1+ ankle edema bilaterally Skin: warm and dry, no rash Neuro:  Strength and sensation are intact Psych: euthymic mood, full affect   EKG:   The ekg ordered 11/30/17 demonstrates sinus bradycardia with NSST   Recent Labs: 11/30/2017: ALT 22 12/01/2017: TSH 53.709 12/02/2017: BUN 11; Creatinine, Ser 0.91; Hemoglobin 12.0; Magnesium 2.1; Platelets 189; Potassium 4.0; Sodium 140   Lipid Panel No results found for: CHOL, TRIG, HDL, CHOLHDL, VLDL, LDLCALC, LDLDIRECT   Other studies Reviewed: Additional studies/ records that were reviewed today with results demonstrating: hospital records reviewed.   ASSESSMENT AND PLAN:  1. CAD: No angina.  COntinue aggressive secondary prevention. Not a cause of dizziness.  Plan for 24 hour Holter to  eval for symptomatic bradycardia.  2. Hypertensive heart disease: BP controlled.  3. Aortic stenosis: Moild on recent echo.  Doubt sx from AS at this point.   4. Carotid artery disease: Moderate.  Not responsible for dizziness.  5. Elevated LFTs: In the past with Tylenol.  Normal in 6/19.   Current medicines are reviewed at length with the patient today.  The patient concerns regarding his medicines were addressed.  The following changes have been made:  No change  Labs/ tests ordered today include:  No orders of the defined types were placed in this encounter.   Recommend 150 minutes/week of aerobic exercise  Low fat, low carb, high fiber diet recommended  Disposition:   FU as scheduled in 3/20   Signed, Larae Grooms, MD  01/07/2018 11:53 AM    [facility] [address], [place], [place]  [address] Phone: [phone]; Fax: [phone]

## 2018-01-07 ENCOUNTER — Encounter: Payer: Self-pay | Admitting: Interventional Cardiology

## 2018-01-07 ENCOUNTER — Ambulatory Visit (INDEPENDENT_AMBULATORY_CARE_PROVIDER_SITE_OTHER): Payer: Medicare Other

## 2018-01-07 ENCOUNTER — Ambulatory Visit (INDEPENDENT_AMBULATORY_CARE_PROVIDER_SITE_OTHER): Payer: Medicare Other | Admitting: Interventional Cardiology

## 2018-01-07 VITALS — BP 124/78 | HR 65 | Ht 65.0 in | Wt 147.0 lb

## 2018-01-07 DIAGNOSIS — R42 Dizziness and giddiness: Secondary | ICD-10-CM

## 2018-01-07 DIAGNOSIS — I350 Nonrheumatic aortic (valve) stenosis: Secondary | ICD-10-CM | POA: Diagnosis not present

## 2018-01-07 DIAGNOSIS — R001 Bradycardia, unspecified: Secondary | ICD-10-CM

## 2018-01-07 DIAGNOSIS — I119 Hypertensive heart disease without heart failure: Secondary | ICD-10-CM | POA: Diagnosis not present

## 2018-01-07 DIAGNOSIS — I2510 Atherosclerotic heart disease of native coronary artery without angina pectoris: Secondary | ICD-10-CM | POA: Diagnosis not present

## 2018-01-07 DIAGNOSIS — I6523 Occlusion and stenosis of bilateral carotid arteries: Secondary | ICD-10-CM

## 2018-01-07 DIAGNOSIS — I779 Disorder of arteries and arterioles, unspecified: Secondary | ICD-10-CM

## 2018-01-07 DIAGNOSIS — R7989 Other specified abnormal findings of blood chemistry: Secondary | ICD-10-CM

## 2018-01-07 DIAGNOSIS — I739 Peripheral vascular disease, unspecified: Secondary | ICD-10-CM

## 2018-01-07 DIAGNOSIS — R945 Abnormal results of liver function studies: Secondary | ICD-10-CM | POA: Diagnosis not present

## 2018-01-07 NOTE — Patient Instructions (Addendum)
Medication Instructions:  Your physician recommends that you continue on your current medications as directed. Please refer to the Current Medication list given to you today.   Labwork: None ordered  Testing/Procedures: Your physician has recommended that you wear a holter monitor (TODAY). Holter monitors are medical devices that record the heart's electrical activity. Doctors most often use these monitors to diagnose arrhythmias. Arrhythmias are problems with the speed or rhythm of the heartbeat. The monitor is a small, portable device. You can wear one while you do your normal daily activities. This is usually used to diagnose what is causing palpitations/syncope (passing out).    Follow-Up: Your physician wants you to follow-up in: March 2020 with Dr. Irish Lack. You will receive a reminder letter in the mail two months in advance. If you don't receive a letter, please call our office to schedule the follow-up appointment.   Any Other Special Instructions Will Be Listed Below (If Applicable).     If you need a refill on your cardiac medications before your next appointment, please call your pharmacy.

## 2018-01-21 DIAGNOSIS — R197 Diarrhea, unspecified: Secondary | ICD-10-CM | POA: Diagnosis not present

## 2018-01-21 DIAGNOSIS — K5090 Crohn's disease, unspecified, without complications: Secondary | ICD-10-CM | POA: Diagnosis not present

## 2018-01-27 ENCOUNTER — Ambulatory Visit: Payer: Medicare Other | Admitting: Physician Assistant

## 2018-02-11 DIAGNOSIS — I119 Hypertensive heart disease without heart failure: Secondary | ICD-10-CM | POA: Diagnosis not present

## 2018-02-11 DIAGNOSIS — E782 Mixed hyperlipidemia: Secondary | ICD-10-CM | POA: Diagnosis not present

## 2018-02-11 DIAGNOSIS — I700 Atherosclerosis of aorta: Secondary | ICD-10-CM | POA: Diagnosis not present

## 2018-02-11 DIAGNOSIS — I25119 Atherosclerotic heart disease of native coronary artery with unspecified angina pectoris: Secondary | ICD-10-CM | POA: Diagnosis not present

## 2018-02-22 ENCOUNTER — Ambulatory Visit (INDEPENDENT_AMBULATORY_CARE_PROVIDER_SITE_OTHER): Payer: Medicare Other | Admitting: Orthopaedic Surgery

## 2018-02-22 ENCOUNTER — Encounter (INDEPENDENT_AMBULATORY_CARE_PROVIDER_SITE_OTHER): Payer: Self-pay | Admitting: Orthopaedic Surgery

## 2018-02-22 DIAGNOSIS — G8929 Other chronic pain: Secondary | ICD-10-CM

## 2018-02-22 DIAGNOSIS — I6523 Occlusion and stenosis of bilateral carotid arteries: Secondary | ICD-10-CM

## 2018-02-22 DIAGNOSIS — M1711 Unilateral primary osteoarthritis, right knee: Secondary | ICD-10-CM

## 2018-02-22 DIAGNOSIS — M25561 Pain in right knee: Secondary | ICD-10-CM

## 2018-02-22 MED ORDER — LIDOCAINE HCL 1 % IJ SOLN
3.0000 mL | INTRAMUSCULAR | Status: AC | PRN
  Administered 2018-02-22: 3 mL

## 2018-02-22 MED ORDER — METHYLPREDNISOLONE ACETATE 40 MG/ML IJ SUSP
40.0000 mg | INTRAMUSCULAR | Status: AC | PRN
  Administered 2018-02-22: 40 mg via INTRA_ARTICULAR

## 2018-02-22 NOTE — Progress Notes (Signed)
   Procedure Note  Patient: Colin Larson             Date of Birth: 1934/07/03           MRN: [id number]             Visit Date: 02/22/2018  Procedures: Visit Diagnoses: Unilateral primary osteoarthritis, right knee  Chronic pain of right knee  Large Joint Inj: R knee on 02/22/2018 1:44 PM Indications: diagnostic evaluation and pain Details: 22 G 1.5 in needle, superolateral approach  Arthrogram: No  Medications: 3 mL lidocaine 1 %; 40 mg methylPREDNISolone acetate 40 MG/ML Outcome: tolerated well, no immediate complications Procedure, treatment alternatives, risks and benefits explained, specific risks discussed. Consent was given by the patient. Immediately prior to procedure a time out was called to verify the correct patient, procedure, equipment, support staff and site/side marked as required. Patient was prepped and draped in the usual sterile fashion.    The patient has chronic right knee pain and known osteoarthritis.  We have seen him before for this.  He is a flareup of his pain.  Is been a long time since patient tried a steroid injection his knee and he would like to have this today.  On effusion but some global tenderness about his right knee.  Feels ligamentously stable.  Per his wishes I agree with him trying a steroid injection in his right knee.  He tolerated this well.  All questions concerns were answered and addressed.  Follow-up will be as needed.

## 2018-03-22 DIAGNOSIS — Z23 Encounter for immunization: Secondary | ICD-10-CM | POA: Diagnosis not present

## 2018-04-05 DIAGNOSIS — L97909 Non-pressure chronic ulcer of unspecified part of unspecified lower leg with unspecified severity: Secondary | ICD-10-CM | POA: Diagnosis not present

## 2018-05-20 DIAGNOSIS — Z85820 Personal history of malignant melanoma of skin: Secondary | ICD-10-CM | POA: Diagnosis not present

## 2018-05-20 DIAGNOSIS — D485 Neoplasm of uncertain behavior of skin: Secondary | ICD-10-CM | POA: Diagnosis not present

## 2018-05-20 DIAGNOSIS — D2272 Melanocytic nevi of left lower limb, including hip: Secondary | ICD-10-CM | POA: Diagnosis not present

## 2018-05-20 DIAGNOSIS — D0439 Carcinoma in situ of skin of other parts of face: Secondary | ICD-10-CM | POA: Diagnosis not present

## 2018-05-20 DIAGNOSIS — Z23 Encounter for immunization: Secondary | ICD-10-CM | POA: Diagnosis not present

## 2018-05-20 DIAGNOSIS — L219 Seborrheic dermatitis, unspecified: Secondary | ICD-10-CM | POA: Diagnosis not present

## 2018-05-20 DIAGNOSIS — L570 Actinic keratosis: Secondary | ICD-10-CM | POA: Diagnosis not present

## 2018-05-20 DIAGNOSIS — Z85828 Personal history of other malignant neoplasm of skin: Secondary | ICD-10-CM | POA: Diagnosis not present

## 2018-05-20 DIAGNOSIS — D225 Melanocytic nevi of trunk: Secondary | ICD-10-CM | POA: Diagnosis not present

## 2018-05-31 DIAGNOSIS — K5090 Crohn's disease, unspecified, without complications: Secondary | ICD-10-CM | POA: Diagnosis not present

## 2018-06-24 DIAGNOSIS — L0293 Carbuncle, unspecified: Secondary | ICD-10-CM | POA: Diagnosis not present

## 2018-07-29 ENCOUNTER — Other Ambulatory Visit: Payer: Self-pay | Admitting: Interventional Cardiology

## 2018-07-29 ENCOUNTER — Ambulatory Visit (HOSPITAL_COMMUNITY)
Admission: RE | Admit: 2018-07-29 | Discharge: 2018-07-29 | Disposition: A | Discharge status: Home / Self Care | Payer: Medicare Other | Source: Ambulatory Visit | Attending: Cardiology | Admitting: Cardiology

## 2018-07-29 DIAGNOSIS — I6523 Occlusion and stenosis of bilateral carotid arteries: Secondary | ICD-10-CM

## 2018-08-02 ENCOUNTER — Telehealth: Payer: Self-pay | Admitting: Interventional Cardiology

## 2018-08-02 DIAGNOSIS — I6523 Occlusion and stenosis of bilateral carotid arteries: Secondary | ICD-10-CM

## 2018-08-02 NOTE — Telephone Encounter (Signed)
The patient has been notified of the result and verbalized understanding.  All questions (if any) were answered. Repeat study ordered for 1 year.

## 2018-08-02 NOTE — Telephone Encounter (Signed)
-----   Message from Jettie Booze, MD sent at 07/30/2018  6:04 PM EST ----- Moderate carotid disease.  Repeat study in one year.

## 2018-08-02 NOTE — Telephone Encounter (Signed)
Patient's wife returned call for test results.

## 2018-08-02 NOTE — Telephone Encounter (Signed)
Left message for patient to call back  

## 2018-08-25 DIAGNOSIS — C44629 Squamous cell carcinoma of skin of left upper limb, including shoulder: Secondary | ICD-10-CM | POA: Diagnosis not present

## 2018-08-25 DIAGNOSIS — D485 Neoplasm of uncertain behavior of skin: Secondary | ICD-10-CM | POA: Diagnosis not present

## 2018-08-25 DIAGNOSIS — L821 Other seborrheic keratosis: Secondary | ICD-10-CM | POA: Diagnosis not present

## 2018-08-25 DIAGNOSIS — L570 Actinic keratosis: Secondary | ICD-10-CM | POA: Diagnosis not present

## 2018-08-25 DIAGNOSIS — B078 Other viral warts: Secondary | ICD-10-CM | POA: Diagnosis not present

## 2018-09-09 DIAGNOSIS — K5090 Crohn's disease, unspecified, without complications: Secondary | ICD-10-CM | POA: Diagnosis not present

## 2018-09-09 DIAGNOSIS — Z85820 Personal history of malignant melanoma of skin: Secondary | ICD-10-CM | POA: Diagnosis not present

## 2018-09-09 DIAGNOSIS — K219 Gastro-esophageal reflux disease without esophagitis: Secondary | ICD-10-CM | POA: Diagnosis not present

## 2018-09-09 DIAGNOSIS — E782 Mixed hyperlipidemia: Secondary | ICD-10-CM | POA: Diagnosis not present

## 2018-09-09 DIAGNOSIS — Z0000 Encounter for general adult medical examination without abnormal findings: Secondary | ICD-10-CM | POA: Diagnosis not present

## 2018-09-09 DIAGNOSIS — I25119 Atherosclerotic heart disease of native coronary artery with unspecified angina pectoris: Secondary | ICD-10-CM | POA: Diagnosis not present

## 2018-09-09 DIAGNOSIS — L309 Dermatitis, unspecified: Secondary | ICD-10-CM | POA: Diagnosis not present

## 2018-09-09 DIAGNOSIS — I119 Hypertensive heart disease without heart failure: Secondary | ICD-10-CM | POA: Diagnosis not present

## 2018-09-09 DIAGNOSIS — I700 Atherosclerosis of aorta: Secondary | ICD-10-CM | POA: Diagnosis not present

## 2018-09-15 DIAGNOSIS — C44622 Squamous cell carcinoma of skin of right upper limb, including shoulder: Secondary | ICD-10-CM | POA: Diagnosis not present

## 2018-09-15 DIAGNOSIS — C44629 Squamous cell carcinoma of skin of left upper limb, including shoulder: Secondary | ICD-10-CM | POA: Diagnosis not present

## 2018-09-15 DIAGNOSIS — D485 Neoplasm of uncertain behavior of skin: Secondary | ICD-10-CM | POA: Diagnosis not present

## 2018-09-15 DIAGNOSIS — C44319 Basal cell carcinoma of skin of other parts of face: Secondary | ICD-10-CM | POA: Diagnosis not present

## 2018-09-15 DIAGNOSIS — L820 Inflamed seborrheic keratosis: Secondary | ICD-10-CM | POA: Diagnosis not present

## 2018-10-05 ENCOUNTER — Other Ambulatory Visit: Payer: Self-pay

## 2018-10-05 ENCOUNTER — Ambulatory Visit (INDEPENDENT_AMBULATORY_CARE_PROVIDER_SITE_OTHER): Payer: Medicare Other | Admitting: Orthopaedic Surgery

## 2018-10-05 ENCOUNTER — Encounter (INDEPENDENT_AMBULATORY_CARE_PROVIDER_SITE_OTHER): Payer: Self-pay | Admitting: Orthopaedic Surgery

## 2018-10-05 DIAGNOSIS — M1712 Unilateral primary osteoarthritis, left knee: Secondary | ICD-10-CM | POA: Diagnosis not present

## 2018-10-05 DIAGNOSIS — M1711 Unilateral primary osteoarthritis, right knee: Secondary | ICD-10-CM

## 2018-10-05 MED ORDER — LIDOCAINE HCL 1 % IJ SOLN
0.5000 mL | INTRAMUSCULAR | Status: AC | PRN
  Administered 2018-10-05: .5 mL

## 2018-10-05 MED ORDER — METHYLPREDNISOLONE ACETATE 40 MG/ML IJ SUSP
40.0000 mg | INTRAMUSCULAR | Status: AC | PRN
  Administered 2018-10-05: 40 mg via INTRA_ARTICULAR

## 2018-10-05 NOTE — Progress Notes (Signed)
   Procedure Note  Patient: Colin Larson             Date of Birth: 04/20/1935           MRN: [id number]             Visit Date: 10/05/2018 HPI: Mr. Wager comes in today requesting injections in both knees.  He states his knee pain is worse with squatting or sitting for prolonged period time.  He has been unable to exercise as much as he is used to due to COVID-19 pandemic is mainly just walking outside for exercise at this point.  He denies any injury to either knee.  He is requesting injections in both knees which he has had in the past and this helped well.  Review of systems: Denies any fevers chills shortness of breath chest pain.  Physical exam: Bilateral knees no effusion abnormal warmth erythema.  He has good range of motion both knees.  Significant patellofemoral crepitus both knees with passive range of motion.  Procedures: Visit Diagnoses: Unilateral primary osteoarthritis, right knee  Unilateral primary osteoarthritis, left knee  Large Joint Inj: bilateral knee on 10/05/2018 9:25 AM Indications: pain Details: 22 G 1.5 in needle, anterolateral approach  Arthrogram: No  Medications (Right): 0.5 mL lidocaine 1 %; 40 mg methylPREDNISolone acetate 40 MG/ML Medications (Left): 0.5 mL lidocaine 1 %; 40 mg methylPREDNISolone acetate 40 MG/ML Outcome: tolerated well, no immediate complications Procedure, treatment alternatives, risks and benefits explained, specific risks discussed. Consent was given by the patient. Immediately prior to procedure a time out was called to verify the correct patient, procedure, equipment, support staff and site/side marked as required. Patient was prepped and draped in the usual sterile fashion.     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  He will follow-up as needed.  He understands he should wait at least 3 months between injections in the knees.  Questions encouraged and answered.

## 2018-10-27 DIAGNOSIS — C44319 Basal cell carcinoma of skin of other parts of face: Secondary | ICD-10-CM | POA: Diagnosis not present

## 2018-12-02 DIAGNOSIS — D485 Neoplasm of uncertain behavior of skin: Secondary | ICD-10-CM | POA: Diagnosis not present

## 2018-12-02 DIAGNOSIS — B353 Tinea pedis: Secondary | ICD-10-CM | POA: Diagnosis not present

## 2018-12-02 DIAGNOSIS — D225 Melanocytic nevi of trunk: Secondary | ICD-10-CM | POA: Diagnosis not present

## 2018-12-02 DIAGNOSIS — L570 Actinic keratosis: Secondary | ICD-10-CM | POA: Diagnosis not present

## 2018-12-02 DIAGNOSIS — Z85828 Personal history of other malignant neoplasm of skin: Secondary | ICD-10-CM | POA: Diagnosis not present

## 2018-12-02 DIAGNOSIS — L080 Pyoderma: Secondary | ICD-10-CM | POA: Diagnosis not present

## 2018-12-02 DIAGNOSIS — C44519 Basal cell carcinoma of skin of other part of trunk: Secondary | ICD-10-CM | POA: Diagnosis not present

## 2018-12-02 DIAGNOSIS — L603 Nail dystrophy: Secondary | ICD-10-CM | POA: Diagnosis not present

## 2018-12-02 DIAGNOSIS — Z85820 Personal history of malignant melanoma of skin: Secondary | ICD-10-CM | POA: Diagnosis not present

## 2018-12-29 DIAGNOSIS — H6120 Impacted cerumen, unspecified ear: Secondary | ICD-10-CM | POA: Diagnosis not present

## 2019-01-26 DIAGNOSIS — L570 Actinic keratosis: Secondary | ICD-10-CM | POA: Diagnosis not present

## 2019-01-26 DIAGNOSIS — D485 Neoplasm of uncertain behavior of skin: Secondary | ICD-10-CM | POA: Diagnosis not present

## 2019-01-26 DIAGNOSIS — C44629 Squamous cell carcinoma of skin of left upper limb, including shoulder: Secondary | ICD-10-CM | POA: Diagnosis not present

## 2019-01-26 DIAGNOSIS — T7840XA Allergy, unspecified, initial encounter: Secondary | ICD-10-CM | POA: Diagnosis not present

## 2019-01-26 DIAGNOSIS — C44519 Basal cell carcinoma of skin of other part of trunk: Secondary | ICD-10-CM | POA: Diagnosis not present

## 2019-02-23 DIAGNOSIS — K5090 Crohn's disease, unspecified, without complications: Secondary | ICD-10-CM | POA: Diagnosis not present

## 2019-02-24 DIAGNOSIS — C44629 Squamous cell carcinoma of skin of left upper limb, including shoulder: Secondary | ICD-10-CM | POA: Diagnosis not present

## 2019-03-01 DIAGNOSIS — Z23 Encounter for immunization: Secondary | ICD-10-CM | POA: Diagnosis not present

## 2019-03-10 DIAGNOSIS — I119 Hypertensive heart disease without heart failure: Secondary | ICD-10-CM | POA: Diagnosis not present

## 2019-03-10 DIAGNOSIS — E782 Mixed hyperlipidemia: Secondary | ICD-10-CM | POA: Diagnosis not present

## 2019-03-14 DIAGNOSIS — E782 Mixed hyperlipidemia: Secondary | ICD-10-CM | POA: Diagnosis not present

## 2019-03-14 DIAGNOSIS — I700 Atherosclerosis of aorta: Secondary | ICD-10-CM | POA: Diagnosis not present

## 2019-03-14 DIAGNOSIS — Z7189 Other specified counseling: Secondary | ICD-10-CM | POA: Diagnosis not present

## 2019-03-14 DIAGNOSIS — R634 Abnormal weight loss: Secondary | ICD-10-CM | POA: Diagnosis not present

## 2019-03-14 DIAGNOSIS — I25119 Atherosclerotic heart disease of native coronary artery with unspecified angina pectoris: Secondary | ICD-10-CM | POA: Diagnosis not present

## 2019-03-14 DIAGNOSIS — W57XXXA Bitten or stung by nonvenomous insect and other nonvenomous arthropods, initial encounter: Secondary | ICD-10-CM | POA: Diagnosis not present

## 2019-03-14 DIAGNOSIS — S80861A Insect bite (nonvenomous), right lower leg, initial encounter: Secondary | ICD-10-CM | POA: Diagnosis not present

## 2019-03-14 DIAGNOSIS — I119 Hypertensive heart disease without heart failure: Secondary | ICD-10-CM | POA: Diagnosis not present

## 2019-04-04 ENCOUNTER — Encounter: Payer: Self-pay | Admitting: Orthopaedic Surgery

## 2019-04-04 ENCOUNTER — Other Ambulatory Visit: Payer: Self-pay

## 2019-04-04 ENCOUNTER — Ambulatory Visit (INDEPENDENT_AMBULATORY_CARE_PROVIDER_SITE_OTHER): Payer: Medicare Other | Admitting: Orthopaedic Surgery

## 2019-04-04 DIAGNOSIS — M1711 Unilateral primary osteoarthritis, right knee: Secondary | ICD-10-CM | POA: Diagnosis not present

## 2019-04-04 DIAGNOSIS — M1712 Unilateral primary osteoarthritis, left knee: Secondary | ICD-10-CM

## 2019-04-04 MED ORDER — LIDOCAINE HCL 1 % IJ SOLN
0.5000 mL | INTRAMUSCULAR | Status: AC | PRN
  Administered 2019-04-04: .5 mL

## 2019-04-04 MED ORDER — METHYLPREDNISOLONE ACETATE 40 MG/ML IJ SUSP
40.0000 mg | INTRAMUSCULAR | Status: AC | PRN
  Administered 2019-04-04: 40 mg via INTRA_ARTICULAR

## 2019-04-04 MED ORDER — LIDOCAINE HCL 1 % IJ SOLN
0.5000 mL | INTRAMUSCULAR | Status: AC | PRN
Start: 2019-04-04 — End: 2019-04-04
  Administered 2019-04-04: .5 mL

## 2019-04-04 NOTE — Progress Notes (Signed)
   Procedure Note  Patient: Colin Larson             Date of Birth: Apr 16, 1935           MRN: [id number]             Visit Date: 04/04/2019 HPI: Mr. Kosky comes in today requesting injections both knees.  He has known osteoarthritis both knees.  He said no new injury to either knee.  Review of systems: No fevers chills shortness of breath chest pain Physical exam: Bilateral knees no abnormal warmth erythema or effusion.  Good range of motion both knees.  Procedures: Visit Diagnoses:  1. Unilateral primary osteoarthritis, right knee   2. Unilateral primary osteoarthritis, left knee     Large Joint Inj: bilateral knee on 04/04/2019 4:07 PM Indications: pain Details: 22 G 1.5 in needle, anterolateral approach  Arthrogram: No  Medications (Right): 0.5 mL lidocaine 1 %; 40 mg methylPREDNISolone acetate 40 MG/ML Medications (Left): 0.5 mL lidocaine 1 %; 40 mg methylPREDNISolone acetate 40 MG/ML Outcome: tolerated well, no immediate complications Procedure, treatment alternatives, risks and benefits explained, specific risks discussed. Consent was given by the patient. Immediately prior to procedure a time out was called to verify the correct patient, procedure, equipment, support staff and site/side marked as required. Patient was prepped and draped in the usual sterile fashion.    Plan: He will follow-up with [place] on an as-needed basis.  He knows to wait at least 3 months between injections.  He did ask about other things he can do for his knee pain outside of Tylenol.  Has significant cardiac history therefore do not recommend NSAIDs.  Therefore told him to speak with his primary care physician about taking turmeric.

## 2019-06-22 DIAGNOSIS — L578 Other skin changes due to chronic exposure to nonionizing radiation: Secondary | ICD-10-CM | POA: Diagnosis not present

## 2019-06-22 DIAGNOSIS — L570 Actinic keratosis: Secondary | ICD-10-CM | POA: Diagnosis not present

## 2019-06-22 DIAGNOSIS — L603 Nail dystrophy: Secondary | ICD-10-CM | POA: Diagnosis not present

## 2019-06-22 DIAGNOSIS — Z85820 Personal history of malignant melanoma of skin: Secondary | ICD-10-CM | POA: Diagnosis not present

## 2019-06-22 DIAGNOSIS — B359 Dermatophytosis, unspecified: Secondary | ICD-10-CM | POA: Diagnosis not present

## 2019-06-22 DIAGNOSIS — D485 Neoplasm of uncertain behavior of skin: Secondary | ICD-10-CM | POA: Diagnosis not present

## 2019-06-22 DIAGNOSIS — D225 Melanocytic nevi of trunk: Secondary | ICD-10-CM | POA: Diagnosis not present

## 2019-06-22 DIAGNOSIS — Z85828 Personal history of other malignant neoplasm of skin: Secondary | ICD-10-CM | POA: Diagnosis not present

## 2019-06-22 DIAGNOSIS — Z23 Encounter for immunization: Secondary | ICD-10-CM | POA: Diagnosis not present

## 2019-07-26 DIAGNOSIS — C44622 Squamous cell carcinoma of skin of right upper limb, including shoulder: Secondary | ICD-10-CM | POA: Diagnosis not present

## 2019-07-26 DIAGNOSIS — D485 Neoplasm of uncertain behavior of skin: Secondary | ICD-10-CM | POA: Diagnosis not present

## 2019-07-26 DIAGNOSIS — D0461 Carcinoma in situ of skin of right upper limb, including shoulder: Secondary | ICD-10-CM | POA: Diagnosis not present

## 2019-07-27 DIAGNOSIS — C44622 Squamous cell carcinoma of skin of right upper limb, including shoulder: Secondary | ICD-10-CM | POA: Diagnosis not present

## 2019-07-29 ENCOUNTER — Ambulatory Visit (HOSPITAL_COMMUNITY)
Admission: RE | Admit: 2019-07-29 | Discharge: 2019-07-29 | Disposition: A | Discharge status: Home / Self Care | Payer: Medicare Other | Source: Ambulatory Visit | Attending: Cardiology | Admitting: Cardiology

## 2019-07-29 ENCOUNTER — Other Ambulatory Visit (HOSPITAL_COMMUNITY): Payer: Self-pay | Admitting: Interventional Cardiology

## 2019-07-29 ENCOUNTER — Other Ambulatory Visit: Payer: Self-pay

## 2019-07-29 DIAGNOSIS — I6523 Occlusion and stenosis of bilateral carotid arteries: Secondary | ICD-10-CM | POA: Diagnosis not present

## 2019-08-01 ENCOUNTER — Encounter (HOSPITAL_COMMUNITY): Payer: Medicare Other

## 2019-08-08 NOTE — Progress Notes (Signed)
Cardiology Office Note   Date:  08/10/2019   ID:  Colin Larson, DOB 07-17-1934, MRN [id number]  PCP:  Mayra Neer, MD    No chief complaint on file.  CAD  Wt Readings from Last 3 Encounters:  08/10/19 141 lb 6.4 oz (64.1 kg)  01/07/18 147 lb (66.7 kg)  12/01/17 144 lb 13.5 oz (65.7 kg)       History of Present Illness: Colin Larson is a 85 y.o. male  with history ofCAD (MI 2000 s/p BMS to prox/mid RCA, 70% lesion in abberant LCx that arises from right coronary cusp), carotid disease, mild AS, HTN, HLD, arthritis, Crohn's disease, hiatal hernia, GIB (from diverticulosis/Crohn's 2014), melanoma of the skin who presents for yearly routine follow-up. Last echo 2014: mild LVH, EF 60-65%, mild AS, mild MR. Carotid duplex 07/2016: 11-57% RICA, 2-62% LICA, >03% RECA.  Elevated LFTs in the past when taking more tylenol for back pain.  He had an episode of dizziness, HTN which prompted visit to the ER.  Stroke ruled out.  Potassium and Magnesium were low.  MRI negative. HR noted to drop to 39 bpm.  Since the last visit, Denies : Chest pain. Dizziness. Leg edema. Nitroglycerin use. Orthopnea. Palpitations. Paroxysmal nocturnal dyspnea. Shortness of breath. Syncope.   Reports shoulder pain from arthritis.   He walks 5 days/week and feels well.  45-50 minutes at a time.   Past Medical History:  Diagnosis Date  . Anemia   . Arthritis   . Carotid artery disease ([place])   . Coronary artery disease    a. MI 2000 s/p BMS to prox/mid RCA, 70% lesion in abberant LCx that arises from right coronary cusp per Dr. Hassell Done note  . Crohn disease ([place])   . GI bleeding    2014 - from diverticulosis/Crohn's 2014  . Hiatal hernia    no surgery  . Hypercholesteremia   . Hypertension   . MI (myocardial infarction) ([place]) [id number]  . Mild aortic stenosis   . Mild mitral regurgitation   . Skin cancer     Past Surgical History:  Procedure Laterality Date  . CHOLECYSTECTOMY      . CORONARY STENT PLACEMENT  [id number]  . INGUINAL HERNIA REPAIR     x 2     Current Outpatient Medications  Medication Sig Dispense Refill  . acetaminophen (TYLENOL) 500 MG tablet Take 1,000 mg by mouth every 6 (six) hours as needed for mild pain or moderate pain.    Marland Kitchen aspirin EC 81 MG tablet Take 81 mg by mouth daily.    Marland Kitchen atorvastatin (LIPITOR) 80 MG tablet Take 80 mg by mouth daily.    . balsalazide (COLAZAL) 750 MG capsule Take 2 capsules by mouth 2 (two) times daily.    . Multiple Vitamin (MULTIVITAMIN WITH MINERALS) TABS tablet Take 1 tablet by mouth daily.    Marland Kitchen omeprazole (PRILOSEC) 20 MG capsule Take 20 mg by mouth daily.    . ramipril (ALTACE) 5 MG capsule Take 5 mg by mouth daily.    Marland Kitchen triamcinolone cream (KENALOG) 0.5 % Apply 1 application topically 3 (three) times daily as needed.    . triamcinolone lotion (KENALOG) 0.1 % Apply 1 application topically 3 (three) times daily as needed.     No current facility-administered medications for this visit.    Allergies:   Bee venom    Social History:  The patient  reports that he quit smoking about 44 years ago. He has a  0.70 pack-year smoking history. He has never used smokeless tobacco. He reports that he does not drink alcohol or use drugs.   Family History:  The patient's family history includes Cancer in his mother; Stroke in his father.    ROS:  Please see the history of present illness.   Otherwise, review of systems are positive for decrease appetite- weight has levelled off.   All other systems are reviewed and negative.    PHYSICAL EXAM: VS:  BP 130/78   Pulse 67   Ht 5' 5"  (1.651 m)   Wt 141 lb 6.4 oz (64.1 kg)   SpO2 97%   BMI 23.53 kg/m  , BMI Body mass index is 23.53 kg/m. GEN: Well nourished, well developed, in no acute distress  HEENT: normal  Neck: no JVD, carotid bruits, or masses Cardiac: RRR; 2/6 murmur, no rubs, or gallops,no edema  Respiratory:  clear to auscultation bilaterally, normal work of  breathing GI: soft, nontender, nondistended, + BS MS: no deformity or atrophy  Skin: warm and dry, no rash Neuro:  Strength and sensation are intact Psych: euthymic mood, full affect   EKG:   The ekg ordered today demonstrates NSR RBBB, no ST changes   Recent Labs: No results found for requested labs within last 8760 hours.   Lipid Panel No results found for: CHOL, TRIG, HDL, CHOLHDL, VLDL, LDLCALC, LDLDIRECT   Other studies Reviewed: Additional studies/ records that were reviewed today with results demonstrating: 2019 hospital records reviewed.   ASSESSMENT AND PLAN:  1. CAD: No angina. Continue aggressive secondary prevention.  2. Carotid artery disease: Moderate right internal carotid disease.  Repeat study in one year.  3. Mild AS: No sx of severe AS.  Last had dizziness in 2019 and was hospitalized.   4. Crohns disease: Stable. Weight has dropped in the past year. 5. Hypertensive heart disease: The current medical regimen is effective;  continue present plan and medications. 6. Hypokalemia in 02/2019.  He had problems with low Mg in 2019.  Will check electrolytes.     Current medicines are reviewed at length with the patient today.  The patient concerns regarding his medicines were addressed.  The following changes have been made:  No change  Labs/ tests ordered today include:  No orders of the defined types were placed in this encounter.   Recommend 150 minutes/week of aerobic exercise Low fat, low carb, high fiber diet recommended  Disposition:   FU in 1 year   Signed, Larae Grooms, MD  08/10/2019 2:07 PM    [facility] [place], [place], [place]  [address] Phone: [phone]; Fax: [phone]

## 2019-08-10 ENCOUNTER — Other Ambulatory Visit: Payer: Self-pay

## 2019-08-10 ENCOUNTER — Ambulatory Visit (INDEPENDENT_AMBULATORY_CARE_PROVIDER_SITE_OTHER): Payer: Medicare Other | Admitting: Interventional Cardiology

## 2019-08-10 ENCOUNTER — Encounter: Payer: Self-pay | Admitting: Interventional Cardiology

## 2019-08-10 VITALS — BP 130/78 | HR 67 | Ht 65.0 in | Wt 141.4 lb

## 2019-08-10 DIAGNOSIS — I119 Hypertensive heart disease without heart failure: Secondary | ICD-10-CM | POA: Diagnosis not present

## 2019-08-10 DIAGNOSIS — I350 Nonrheumatic aortic (valve) stenosis: Secondary | ICD-10-CM | POA: Diagnosis not present

## 2019-08-10 DIAGNOSIS — I2510 Atherosclerotic heart disease of native coronary artery without angina pectoris: Secondary | ICD-10-CM | POA: Diagnosis not present

## 2019-08-10 DIAGNOSIS — E785 Hyperlipidemia, unspecified: Secondary | ICD-10-CM

## 2019-08-10 DIAGNOSIS — I6523 Occlusion and stenosis of bilateral carotid arteries: Secondary | ICD-10-CM | POA: Diagnosis not present

## 2019-08-10 DIAGNOSIS — E876 Hypokalemia: Secondary | ICD-10-CM | POA: Diagnosis not present

## 2019-08-10 NOTE — Patient Instructions (Addendum)
Medication Instructions:  Your physician recommends that you continue on your current medications as directed. Please refer to the Current Medication list given to you today.  *If you need a refill on your cardiac medications before your next appointment, please call your pharmacy*  Lab Work: TODAY: CMET, MG  If you have labs (blood work) drawn today and your tests are completely normal, you will receive your results only by: Marland Kitchen MyChart Message (if you have MyChart) OR . A paper copy in the mail If you have any lab test that is abnormal or we need to change your treatment, we will call you to review the results.  Testing/Procedures: None ordered  Follow-Up: At [facility], you and your health needs are our priority.  As part of our continuing mission to provide you with exceptional heart care, we have created designated Provider Care Teams.  These Care Teams include your primary Cardiologist (physician) and Advanced Practice Providers (APPs -  Physician Assistants and Nurse Practitioners) who all work together to provide you with the care you need, when you need it.  Your next appointment:   12 month(s)  The format for your next appointment:   In Person  Provider:   You may see Larae Grooms, MD or one of the following Advanced Practice Providers on your designated Care Team:    Melina Copa, PA-C  Ermalinda Barrios, PA-C   Other Instructions

## 2019-08-11 ENCOUNTER — Other Ambulatory Visit: Payer: Self-pay | Admitting: Nurse Practitioner

## 2019-08-11 DIAGNOSIS — I119 Hypertensive heart disease without heart failure: Secondary | ICD-10-CM

## 2019-08-11 LAB — COMPREHENSIVE METABOLIC PANEL
ALT: 21 IU/L (ref 0–44)
AST: 35 IU/L (ref 0–40)
Albumin/Globulin Ratio: 1.3 (ref 1.2–2.2)
Albumin: 3.4 g/dL — ABNORMAL LOW (ref 3.6–4.6)
Alkaline Phosphatase: 58 IU/L (ref 39–117)
BUN/Creatinine Ratio: 11 (ref 10–24)
BUN: 10 mg/dL (ref 8–27)
Bilirubin Total: 0.3 mg/dL (ref 0.0–1.2)
CO2: 25 mmol/L (ref 20–29)
Calcium: 9 mg/dL (ref 8.6–10.2)
Chloride: 105 mmol/L (ref 96–106)
Creatinine, Ser: 0.88 mg/dL (ref 0.76–1.27)
GFR calc Af Amer: 92 mL/min/{1.73_m2} (ref >59)
GFR calc non Af Amer: 80 mL/min/{1.73_m2} (ref >59)
Globulin, Total: 2.7 g/dL (ref 1.5–4.5)
Glucose: 89 mg/dL (ref 65–99)
Potassium: 3.7 mmol/L (ref 3.5–5.2)
Sodium: 141 mmol/L (ref 134–144)
Total Protein: 6.1 g/dL (ref 6.0–8.5)

## 2019-08-11 LAB — MAGNESIUM: Magnesium: 1.2 mg/dL — ABNORMAL LOW (ref 1.6–2.3)

## 2019-08-19 ENCOUNTER — Other Ambulatory Visit: Payer: Self-pay

## 2019-08-19 ENCOUNTER — Other Ambulatory Visit: Payer: Medicare Other | Admitting: *Deleted

## 2019-08-19 DIAGNOSIS — I119 Hypertensive heart disease without heart failure: Secondary | ICD-10-CM | POA: Diagnosis not present

## 2019-08-20 LAB — BASIC METABOLIC PANEL
BUN/Creatinine Ratio: 10 (ref 10–24)
BUN: 9 mg/dL (ref 8–27)
CO2: 27 mmol/L (ref 20–29)
Calcium: 9.8 mg/dL (ref 8.6–10.2)
Chloride: 103 mmol/L (ref 96–106)
Creatinine, Ser: 0.91 mg/dL (ref 0.76–1.27)
GFR calc Af Amer: 90 mL/min/{1.73_m2} (ref >59)
GFR calc non Af Amer: 78 mL/min/{1.73_m2} (ref >59)
Glucose: 92 mg/dL (ref 65–99)
Potassium: 4 mmol/L (ref 3.5–5.2)
Sodium: 142 mmol/L (ref 134–144)

## 2019-08-20 LAB — MAGNESIUM: Magnesium: 1.7 mg/dL (ref 1.6–2.3)

## 2019-08-22 ENCOUNTER — Telehealth: Payer: Self-pay

## 2019-08-22 NOTE — Telephone Encounter (Signed)
-----   Message from Jettie Booze, MD sent at 08/21/2019  6:42 PM EST ----- Mg better.  Has he been taking a supplement?

## 2019-08-22 NOTE — Telephone Encounter (Signed)
Colin Larson (DPR on file) has been notified of the result and verbalized understanding.  Patient has been taking 500 mg of magnesium QD. Instructed for patient to continue taking the magnesium and if there were an other recommendations we would let them know. All questions (if any) were answered. Cleon Gustin, RN 08/22/2019 9:57 AM

## 2019-08-25 DIAGNOSIS — L988 Other specified disorders of the skin and subcutaneous tissue: Secondary | ICD-10-CM | POA: Diagnosis not present

## 2019-08-25 DIAGNOSIS — C44622 Squamous cell carcinoma of skin of right upper limb, including shoulder: Secondary | ICD-10-CM | POA: Diagnosis not present

## 2019-08-31 ENCOUNTER — Other Ambulatory Visit: Payer: Self-pay | Admitting: Physician Assistant

## 2019-08-31 DIAGNOSIS — K529 Noninfective gastroenteritis and colitis, unspecified: Secondary | ICD-10-CM | POA: Diagnosis not present

## 2019-08-31 DIAGNOSIS — K5000 Crohn's disease of small intestine without complications: Secondary | ICD-10-CM

## 2019-08-31 DIAGNOSIS — R109 Unspecified abdominal pain: Secondary | ICD-10-CM | POA: Diagnosis not present

## 2019-08-31 DIAGNOSIS — R634 Abnormal weight loss: Secondary | ICD-10-CM | POA: Diagnosis not present

## 2019-09-02 DIAGNOSIS — K5000 Crohn's disease of small intestine without complications: Secondary | ICD-10-CM | POA: Diagnosis not present

## 2019-09-16 ENCOUNTER — Other Ambulatory Visit: Payer: Self-pay

## 2019-09-16 ENCOUNTER — Ambulatory Visit
Admission: RE | Admit: 2019-09-16 | Discharge: 2019-09-16 | Disposition: A | Discharge status: Home / Self Care | Payer: Medicare Other | Source: Ambulatory Visit | Attending: Physician Assistant | Admitting: Physician Assistant

## 2019-09-16 DIAGNOSIS — K5000 Crohn's disease of small intestine without complications: Secondary | ICD-10-CM

## 2019-09-16 MED ORDER — IOPAMIDOL (ISOVUE-300) INJECTION 61%
100.0000 mL | Freq: Once | INTRAVENOUS | Status: AC | PRN
  Administered 2019-09-16: 100 mL via INTRAVENOUS

## 2019-09-22 DIAGNOSIS — I25119 Atherosclerotic heart disease of native coronary artery with unspecified angina pectoris: Secondary | ICD-10-CM | POA: Diagnosis not present

## 2019-09-22 DIAGNOSIS — Z85820 Personal history of malignant melanoma of skin: Secondary | ICD-10-CM | POA: Diagnosis not present

## 2019-09-22 DIAGNOSIS — Z0000 Encounter for general adult medical examination without abnormal findings: Secondary | ICD-10-CM | POA: Diagnosis not present

## 2019-09-22 DIAGNOSIS — E782 Mixed hyperlipidemia: Secondary | ICD-10-CM | POA: Diagnosis not present

## 2019-09-22 DIAGNOSIS — I700 Atherosclerosis of aorta: Secondary | ICD-10-CM | POA: Diagnosis not present

## 2019-09-22 DIAGNOSIS — K5090 Crohn's disease, unspecified, without complications: Secondary | ICD-10-CM | POA: Diagnosis not present

## 2019-09-22 DIAGNOSIS — I119 Hypertensive heart disease without heart failure: Secondary | ICD-10-CM | POA: Diagnosis not present

## 2019-09-22 DIAGNOSIS — K219 Gastro-esophageal reflux disease without esophagitis: Secondary | ICD-10-CM | POA: Diagnosis not present

## 2019-09-27 DIAGNOSIS — K5000 Crohn's disease of small intestine without complications: Secondary | ICD-10-CM | POA: Diagnosis not present

## 2019-10-13 DIAGNOSIS — D225 Melanocytic nevi of trunk: Secondary | ICD-10-CM | POA: Diagnosis not present

## 2019-10-13 DIAGNOSIS — Z85820 Personal history of malignant melanoma of skin: Secondary | ICD-10-CM | POA: Diagnosis not present

## 2019-10-13 DIAGNOSIS — Z85828 Personal history of other malignant neoplasm of skin: Secondary | ICD-10-CM | POA: Diagnosis not present

## 2019-10-13 DIAGNOSIS — L821 Other seborrheic keratosis: Secondary | ICD-10-CM | POA: Diagnosis not present

## 2019-10-13 DIAGNOSIS — D044 Carcinoma in situ of skin of scalp and neck: Secondary | ICD-10-CM | POA: Diagnosis not present

## 2019-10-13 DIAGNOSIS — L570 Actinic keratosis: Secondary | ICD-10-CM | POA: Diagnosis not present

## 2019-10-13 DIAGNOSIS — D2272 Melanocytic nevi of left lower limb, including hip: Secondary | ICD-10-CM | POA: Diagnosis not present

## 2019-10-13 DIAGNOSIS — L578 Other skin changes due to chronic exposure to nonionizing radiation: Secondary | ICD-10-CM | POA: Diagnosis not present

## 2019-10-13 DIAGNOSIS — D485 Neoplasm of uncertain behavior of skin: Secondary | ICD-10-CM | POA: Diagnosis not present

## 2020-01-12 DIAGNOSIS — D485 Neoplasm of uncertain behavior of skin: Secondary | ICD-10-CM | POA: Diagnosis not present

## 2020-01-12 DIAGNOSIS — L570 Actinic keratosis: Secondary | ICD-10-CM | POA: Diagnosis not present

## 2020-01-12 DIAGNOSIS — C4442 Squamous cell carcinoma of skin of scalp and neck: Secondary | ICD-10-CM | POA: Diagnosis not present

## 2020-01-12 DIAGNOSIS — C44629 Squamous cell carcinoma of skin of left upper limb, including shoulder: Secondary | ICD-10-CM | POA: Diagnosis not present

## 2020-01-25 DIAGNOSIS — H6120 Impacted cerumen, unspecified ear: Secondary | ICD-10-CM | POA: Diagnosis not present

## 2020-02-14 DIAGNOSIS — M542 Cervicalgia: Secondary | ICD-10-CM | POA: Diagnosis not present

## 2020-02-15 DIAGNOSIS — D0461 Carcinoma in situ of skin of right upper limb, including shoulder: Secondary | ICD-10-CM | POA: Diagnosis not present

## 2020-02-15 DIAGNOSIS — C4442 Squamous cell carcinoma of skin of scalp and neck: Secondary | ICD-10-CM | POA: Diagnosis not present

## 2020-02-23 DIAGNOSIS — M542 Cervicalgia: Secondary | ICD-10-CM | POA: Diagnosis not present

## 2020-02-27 DIAGNOSIS — M542 Cervicalgia: Secondary | ICD-10-CM | POA: Diagnosis not present

## 2020-02-29 DIAGNOSIS — M542 Cervicalgia: Secondary | ICD-10-CM | POA: Diagnosis not present

## 2020-03-05 DIAGNOSIS — M542 Cervicalgia: Secondary | ICD-10-CM | POA: Diagnosis not present

## 2020-03-07 DIAGNOSIS — M542 Cervicalgia: Secondary | ICD-10-CM | POA: Diagnosis not present

## 2020-03-08 DIAGNOSIS — C44629 Squamous cell carcinoma of skin of left upper limb, including shoulder: Secondary | ICD-10-CM | POA: Diagnosis not present

## 2020-03-08 DIAGNOSIS — D0462 Carcinoma in situ of skin of left upper limb, including shoulder: Secondary | ICD-10-CM | POA: Diagnosis not present

## 2020-03-08 DIAGNOSIS — D485 Neoplasm of uncertain behavior of skin: Secondary | ICD-10-CM | POA: Diagnosis not present

## 2020-03-16 DIAGNOSIS — K5090 Crohn's disease, unspecified, without complications: Secondary | ICD-10-CM | POA: Diagnosis not present

## 2020-03-28 DIAGNOSIS — I119 Hypertensive heart disease without heart failure: Secondary | ICD-10-CM | POA: Diagnosis not present

## 2020-03-28 DIAGNOSIS — Z23 Encounter for immunization: Secondary | ICD-10-CM | POA: Diagnosis not present

## 2020-03-28 DIAGNOSIS — K5090 Crohn's disease, unspecified, without complications: Secondary | ICD-10-CM | POA: Diagnosis not present

## 2020-03-28 DIAGNOSIS — R4189 Other symptoms and signs involving cognitive functions and awareness: Secondary | ICD-10-CM | POA: Diagnosis not present

## 2020-03-28 DIAGNOSIS — E782 Mixed hyperlipidemia: Secondary | ICD-10-CM | POA: Diagnosis not present

## 2020-03-28 DIAGNOSIS — I25119 Atherosclerotic heart disease of native coronary artery with unspecified angina pectoris: Secondary | ICD-10-CM | POA: Diagnosis not present

## 2020-04-09 DIAGNOSIS — Z23 Encounter for immunization: Secondary | ICD-10-CM | POA: Diagnosis not present

## 2020-04-10 DIAGNOSIS — Z5189 Encounter for other specified aftercare: Secondary | ICD-10-CM | POA: Diagnosis not present

## 2020-04-10 DIAGNOSIS — L0889 Other specified local infections of the skin and subcutaneous tissue: Secondary | ICD-10-CM | POA: Diagnosis not present

## 2020-04-10 DIAGNOSIS — D0462 Carcinoma in situ of skin of left upper limb, including shoulder: Secondary | ICD-10-CM | POA: Diagnosis not present

## 2020-04-23 DIAGNOSIS — K5000 Crohn's disease of small intestine without complications: Secondary | ICD-10-CM | POA: Diagnosis not present

## 2020-04-23 DIAGNOSIS — R634 Abnormal weight loss: Secondary | ICD-10-CM | POA: Diagnosis not present

## 2020-04-23 DIAGNOSIS — K529 Noninfective gastroenteritis and colitis, unspecified: Secondary | ICD-10-CM | POA: Diagnosis not present

## 2020-05-02 DIAGNOSIS — D225 Melanocytic nevi of trunk: Secondary | ICD-10-CM | POA: Diagnosis not present

## 2020-05-02 DIAGNOSIS — D485 Neoplasm of uncertain behavior of skin: Secondary | ICD-10-CM | POA: Diagnosis not present

## 2020-05-02 DIAGNOSIS — B359 Dermatophytosis, unspecified: Secondary | ICD-10-CM | POA: Diagnosis not present

## 2020-05-02 DIAGNOSIS — D0439 Carcinoma in situ of skin of other parts of face: Secondary | ICD-10-CM | POA: Diagnosis not present

## 2020-05-02 DIAGNOSIS — Z85820 Personal history of malignant melanoma of skin: Secondary | ICD-10-CM | POA: Diagnosis not present

## 2020-05-02 DIAGNOSIS — L578 Other skin changes due to chronic exposure to nonionizing radiation: Secondary | ICD-10-CM | POA: Diagnosis not present

## 2020-05-02 DIAGNOSIS — L821 Other seborrheic keratosis: Secondary | ICD-10-CM | POA: Diagnosis not present

## 2020-05-02 DIAGNOSIS — L570 Actinic keratosis: Secondary | ICD-10-CM | POA: Diagnosis not present

## 2020-05-02 DIAGNOSIS — Z85828 Personal history of other malignant neoplasm of skin: Secondary | ICD-10-CM | POA: Diagnosis not present

## 2020-05-02 DIAGNOSIS — D0462 Carcinoma in situ of skin of left upper limb, including shoulder: Secondary | ICD-10-CM | POA: Diagnosis not present

## 2020-05-02 DIAGNOSIS — D2272 Melanocytic nevi of left lower limb, including hip: Secondary | ICD-10-CM | POA: Diagnosis not present

## 2020-05-22 DIAGNOSIS — H6120 Impacted cerumen, unspecified ear: Secondary | ICD-10-CM | POA: Diagnosis not present

## 2020-05-28 ENCOUNTER — Encounter: Payer: Self-pay | Admitting: Orthopaedic Surgery

## 2020-05-28 ENCOUNTER — Ambulatory Visit (INDEPENDENT_AMBULATORY_CARE_PROVIDER_SITE_OTHER): Payer: Medicare Other | Admitting: Orthopaedic Surgery

## 2020-05-28 DIAGNOSIS — M5441 Lumbago with sciatica, right side: Secondary | ICD-10-CM | POA: Diagnosis not present

## 2020-05-28 DIAGNOSIS — M25561 Pain in right knee: Secondary | ICD-10-CM

## 2020-05-28 DIAGNOSIS — G8929 Other chronic pain: Secondary | ICD-10-CM | POA: Diagnosis not present

## 2020-05-28 DIAGNOSIS — M542 Cervicalgia: Secondary | ICD-10-CM | POA: Diagnosis not present

## 2020-05-28 DIAGNOSIS — M25512 Pain in left shoulder: Secondary | ICD-10-CM | POA: Diagnosis not present

## 2020-05-28 DIAGNOSIS — I6523 Occlusion and stenosis of bilateral carotid arteries: Secondary | ICD-10-CM | POA: Diagnosis not present

## 2020-05-28 DIAGNOSIS — M25511 Pain in right shoulder: Secondary | ICD-10-CM | POA: Diagnosis not present

## 2020-05-28 MED ORDER — METHYLPREDNISOLONE 4 MG PO TABS: ORAL_TABLET | ORAL | 0 refills | Status: DC

## 2020-05-28 MED ORDER — MELOXICAM 15 MG PO TABS: 15.0000 mg | ORAL_TABLET | Freq: Every day | ORAL | 1 refills | Status: DC

## 2020-05-28 NOTE — Progress Notes (Signed)
Office Visit Note   Patient: Colin Larson           Date of Birth: March 08, 1935           MRN: [id number] Visit Date: 05/28/2020              Requested by: Mayra Neer, MD [address] [place],  [place] [address] PCP: Mayra Neer, MD   Assessment & Plan: Visit Diagnoses:  1. Cervicalgia   2. Chronic pain of right knee   3. Chronic right-sided low back pain with right-sided sciatica   4. Chronic pain of both shoulders     Plan: Right now would not recommend any type of injection since he is almost asymptomatic.  I did recommend a 6-day steroid taper to hopefully decrease the inflammation in his body and at least once a day meloxicam for the next 4 weeks.  He agrees with this treatment plan.  I will see him back in 4 weeks to see how he is doing overall.  All questions and concerns were answered and addressed.  Follow-Up Instructions: Return in about 4 weeks (around 06/25/2020).   Orders:  No orders of the defined types were placed in this encounter.  Meds ordered this encounter  Medications  . methylPREDNISolone (MEDROL) 4 MG tablet    Sig: Medrol dose pack. Take as instructed    Dispense:  21 tablet    Refill:  0  . meloxicam (MOBIC) 15 MG tablet    Sig: Take 1 tablet (15 mg total) by mouth daily.    Dispense:  30 tablet    Refill:  1      Procedures: No procedures performed   Clinical Data: No additional findings.   Subjective: Chief Complaint  Patient presents with  . Right Knee - Pain  The patient comes today with multiple complaints.  He is 85 years old and does have a history of Crohn's disease.  His biggest complaint had been neck pain and stiffness.  His primary care physician put him through physical therapy and that is helped quite a bit.  At one point he cannot really turn his neck well and now he can.  He has intermittent bilateral shoulder pain and intermittent bilateral knee pain.  He also has intermittent right-sided  sciatica.  He says none of these things are on a daily basis but the pain comes and goes.  It does go away more when he is more active.  He says the right-sided sciatica bothers him first thing in the morning.  The neck pain is now been off and on.  He was requesting potentially cortisone injection in his right knee today because of on and off pain but the knee is not bothering him today.  He is not on blood thinning medication and he is not a diabetic.  HPI  Review of Systems He currently denies any headache, chest pain, shortness of breath, fever, chills, nausea, vomiting  Objective: Vital Signs: There were no vitals taken for this visit.  Physical Exam He is alert and orient x3 and in no acute distress Ortho Exam Examination of his all his joints seem normal today with just some mild pain in this his shoulders and knees and hips.  There is some mild sciatic pain on the right side but no issues with a straight leg raise being negative and no weakness in his legs.  His neck is still stiff but has good range of motion. Specialty Comments:  No specialty comments available.  Imaging: No results found.   PMFS History: Patient Active Problem List   Diagnosis Date Noted  . Bradycardia 12/01/2017  . Dizziness 11/30/2017  . Elevated LFTs 08/24/2017  . Unilateral primary osteoarthritis, right knee 02/25/2017  . Chronic pain of right knee 02/25/2017  . Unilateral primary osteoarthritis, left knee 02/25/2017  . History of anemia 07/30/2016  . Carotid artery disease ([place])   . GI bleeding   . Mild aortic stenosis   . Mild mitral regurgitation   . Mixed hyperlipidemia 05/04/2013  . GERD (gastroesophageal reflux disease) 05/04/2013  . Diverticulosis of colon with hemorrhage 05/04/2013  . Coronary artery disease 05/04/2013  . Crohn disease ([place]) 05/04/2013  . Melanoma ([place]) 01/07/2012  . Essential hypertension 12/29/2011  . Hypercholesteremia 12/29/2011   Past Medical History:   Diagnosis Date  . Anemia   . Arthritis   . Carotid artery disease ([place])   . Coronary artery disease    a. MI 2000 s/p BMS to prox/mid RCA, 70% lesion in abberant LCx that arises from right coronary cusp per Dr. Hassell Done note  . Crohn disease ([place])   . GI bleeding    2014 - from diverticulosis/Crohn's 2014  . Hiatal hernia    no surgery  . Hypercholesteremia   . Hypertension   . MI (myocardial infarction) ([place]) [id number]  . Mild aortic stenosis   . Mild mitral regurgitation   . Skin cancer     Family History  Problem Relation Age of Onset  . Cancer Mother   . Stroke Father     Past Surgical History:  Procedure Laterality Date  . CHOLECYSTECTOMY    . CORONARY STENT PLACEMENT  [id number]  . INGUINAL HERNIA REPAIR     x 2   Social History   Occupational History  . Not on file  Tobacco Use  . Smoking status: Former Smoker    Packs/day: 0.14    Years: 5.00    Pack years: 0.70    Quit date: 02/15/1975    Years since quitting: 45.3  . Smokeless tobacco: Never Used  Substance and Sexual Activity  . Alcohol use: No  . Drug use: No  . Sexual activity: Not on file

## 2020-06-06 IMAGING — CT CT ENTEROGRAPHY (ABD-PELV W/ CM)
1 of 4 series · 12 of 32 positions shown, 18 images · IV contrast (APPLIED)
Comparison: 02/27/2015 CT enterography.

CLINICAL DATA: Crohn disease of small intestine, presenting for
routine follow-up. No acute symptoms reported.

EXAM:
CT ABDOMEN AND PELVIS WITH CONTRAST (ENTEROGRAPHY)
TECHNIQUE: Multidetector CT of the abdomen and pelvis during bolus
administration of intravenous contrast. Negative oral contrast was
given.
CONTRAST:  100mL 1HMRXI-5SS IOPAMIDOL (1HMRXI-5SS) INJECTION 61%

[Series 3: enterography 3 mm · axial · 0.69mm/px · z∈[-434,-18]mm · 12 of 165 slices shown, 18 images]
[im 13/165  soft-tissue]
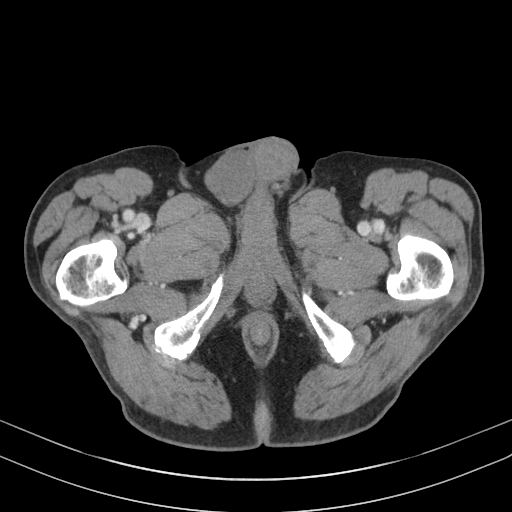
[im 13/165  bone]
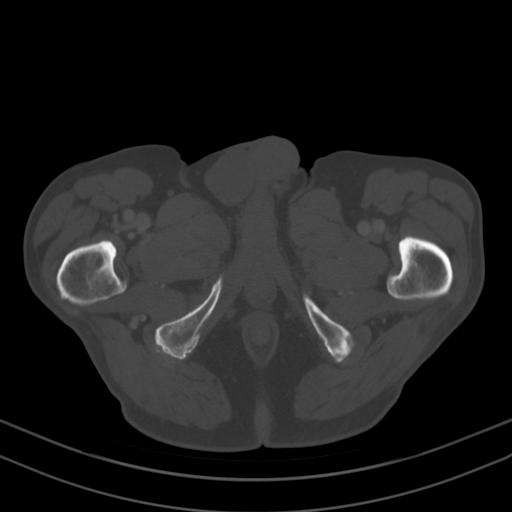
[im 26/165  soft-tissue]
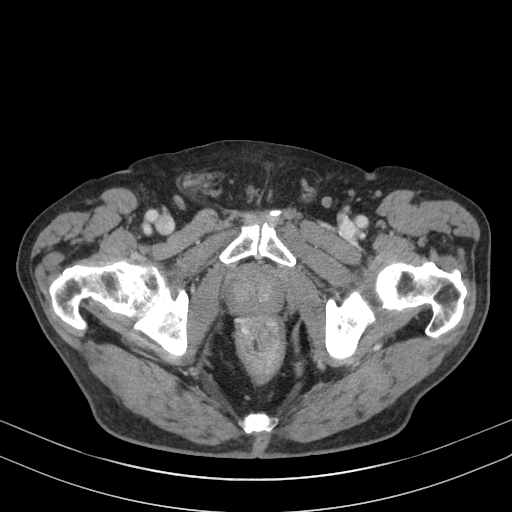
[im 38/165  soft-tissue]
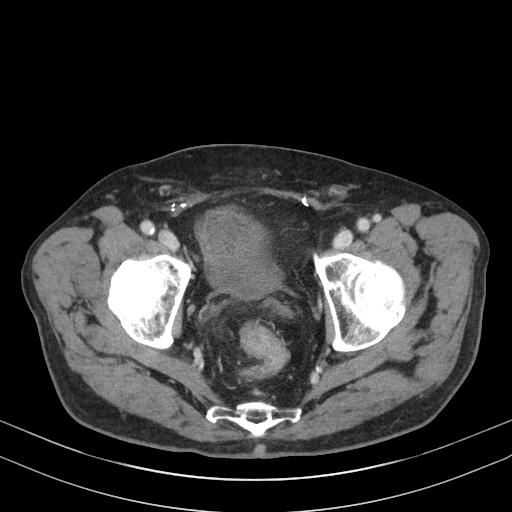
[im 51/165  soft-tissue]
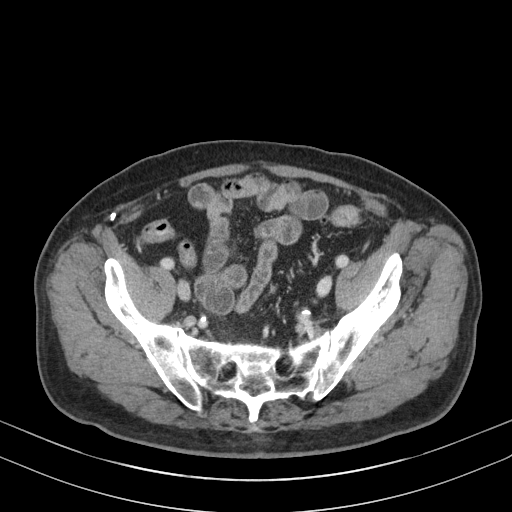
[im 64/165  soft-tissue]
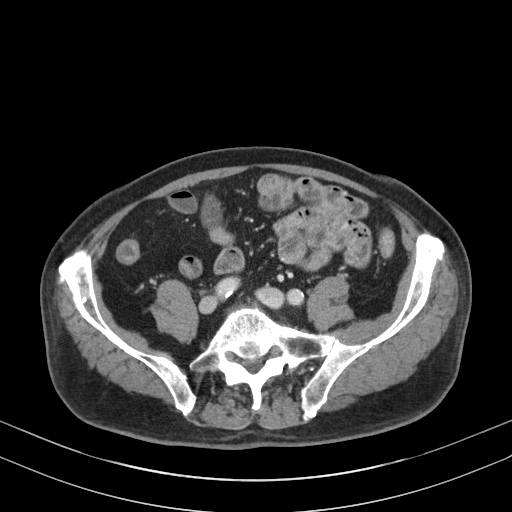
[im 76/165  soft-tissue]
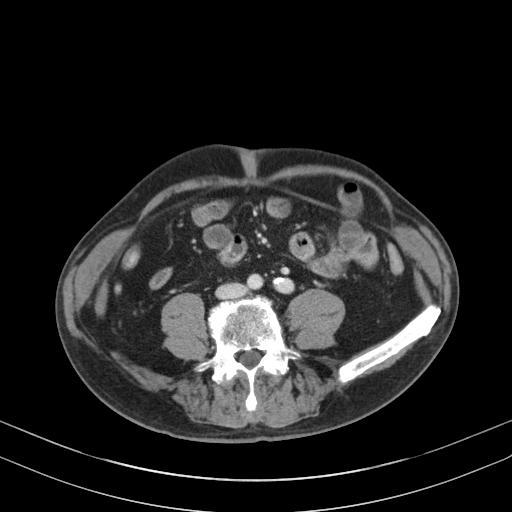
[im 89/165  soft-tissue]
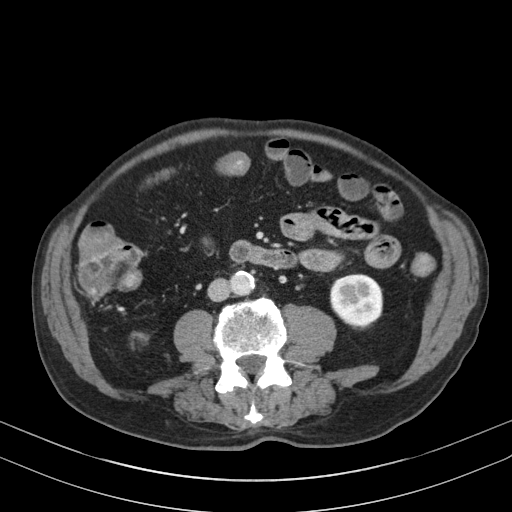
[im 101/165  soft-tissue]
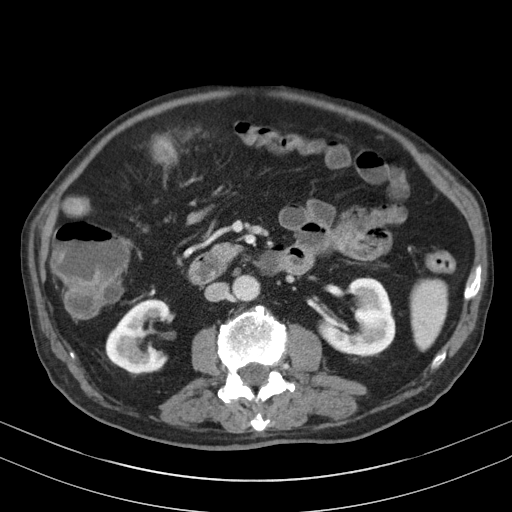
[im 114/165  soft-tissue]
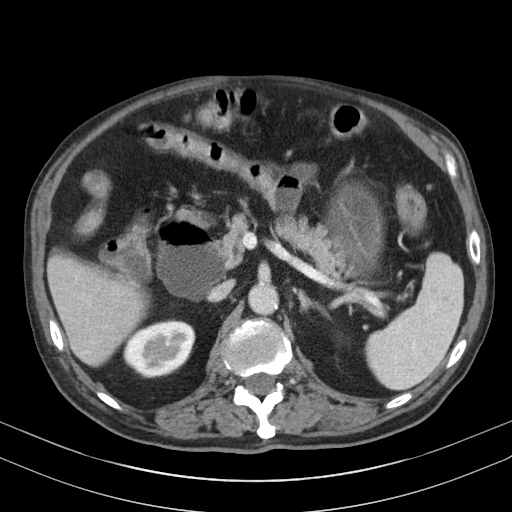
[im 114/165  lung]
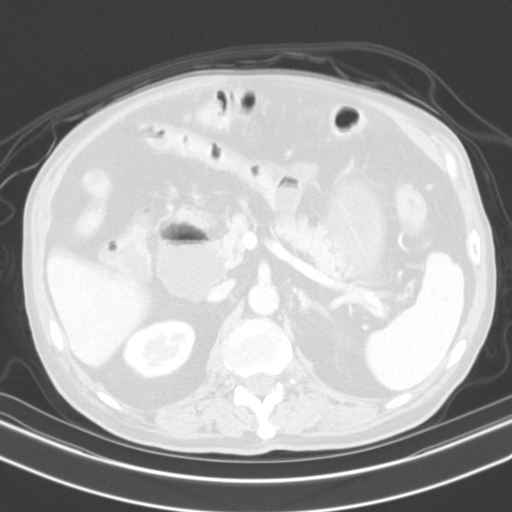
[im 114/165  bone]
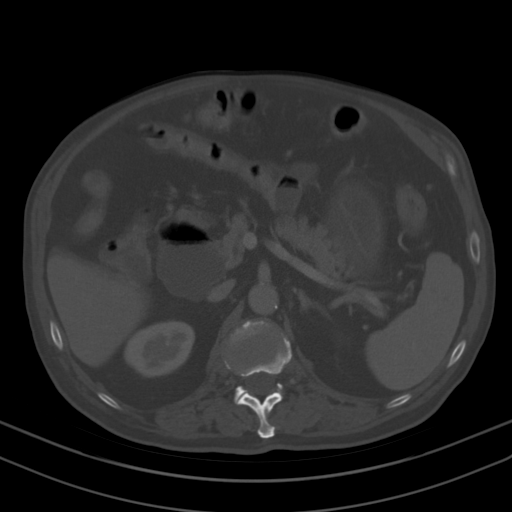
[im 127/165  soft-tissue]
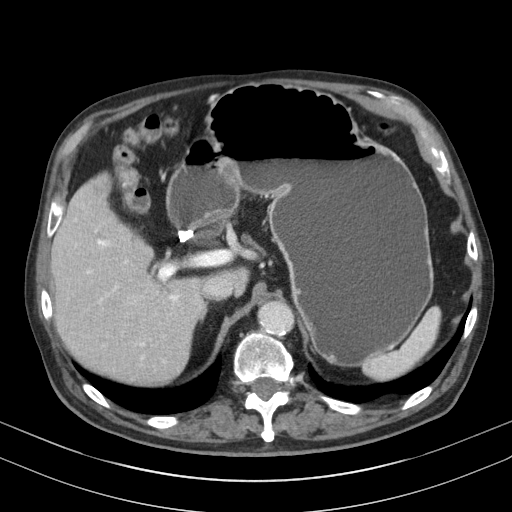
[im 127/165  lung]
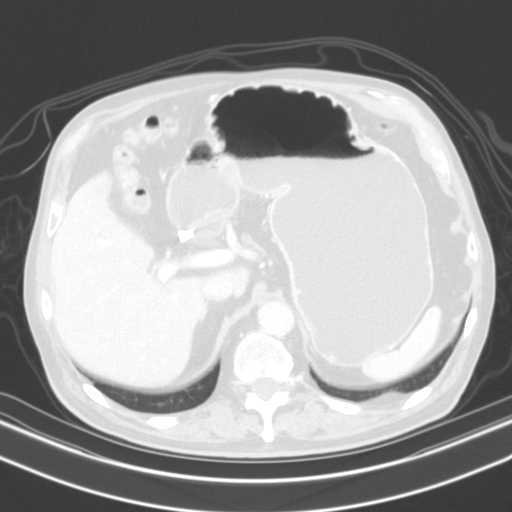
[im 139/165  soft-tissue]
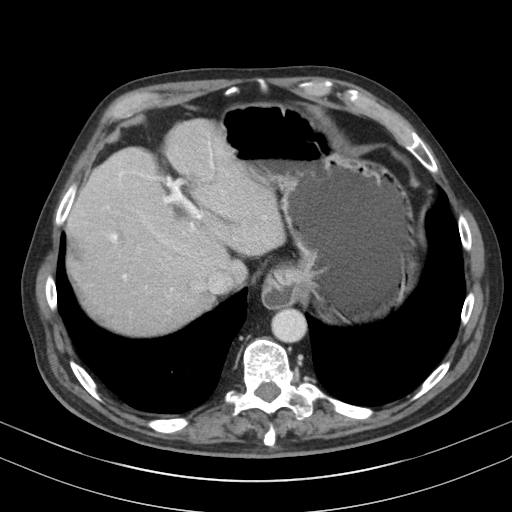
[im 139/165  lung]
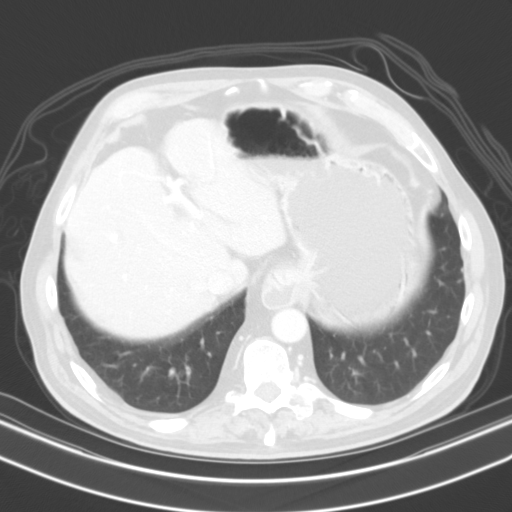
[im 152/165  soft-tissue]
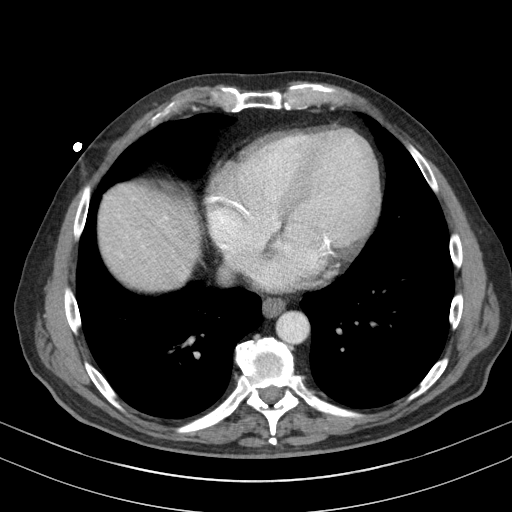
[im 152/165  lung]
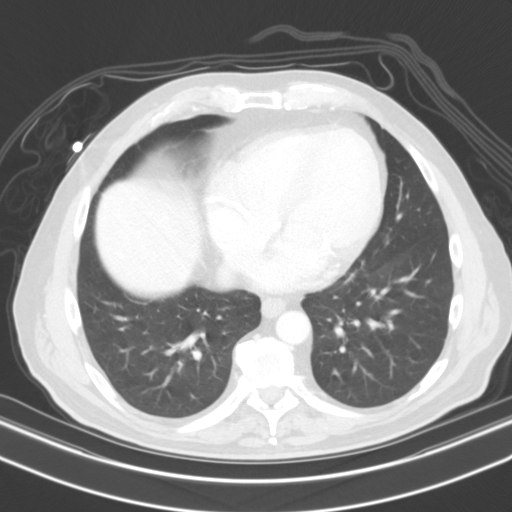

[12 of 32 positions shown; findings below may reference images not displayed]

FINDINGS: Lower chest: No acute abnormality at the lung bases. Coronary
atherosclerosis.

Hepatobiliary: Normal liver size. No liver mass. Cholecystectomy.
Bile ducts are within normal post cholecystectomy limits with CBD
diameter 6 mm. Moderate to large periampullary duodenal
diverticulum.

Pancreas: Normal, with no mass or duct dilation.

Spleen: Normal size. No mass.

Adrenals/Urinary Tract: Normal adrenals. No hydronephrosis. A few
scattered subcentimeter hypodense renal cortical lesions in both
kidneys are too small to characterize and require no follow-up.
Normal collapsed bladder.

Stomach/Bowel: Small hiatal hernia. Otherwise normal stomach with no
gastric wall thickening. Normal caliber small bowel with no
significant focal small bowel caliber transition. There is a long
segment of chronic mild small bowel wall thickening with skip
lesions in the right abdomen involving the mid to distal and
terminal ileum with associated mucosal hyperenhancement in the mid
to distal ileum (for example series 3/images 80 and 83) and in the
terminal ileum (series 3/image 76). The degree of wall thickening is
similar to the 3899 CT study. No bowel pneumatosis or fistula.
Normal appendix. Moderate left colonic diverticulosis. There is mild
wall thickening and associated pericolonic fat stranding in distal
sigmoid colon (series 2/image 74), cannot exclude acute sigmoid
diverticulitis. No additional sites of large bowel wall thickening
or pericolonic fat stranding.

Vascular/Lymphatic: Atherosclerotic nonaneurysmal abdominal aorta.
Patent portal, splenic, hepatic and renal veins. No pathologically
enlarged lymph nodes in the abdomen or pelvis.

Reproductive: Mild prostatomegaly with nonspecific internal
prostatic calcifications.

Other: No pneumoperitoneum, ascites or focal fluid collection.

Musculoskeletal: No aggressive appearing focal osseous lesions.
Moderate thoracolumbar spondylosis.
IMPRESSION: 1. Long segment of chronic mild small bowel wall thickening with
skip lesions in the mid to distal ileum and terminal ileum with
associated mucosal hyperenhancement, similar to the 3899 CT study,
compatible with active chronic Crohn disease. No evidence of bowel
obstruction, fistula or abscess.
2. Moderate left colonic diverticulosis. Mild wall thickening and
associated pericolonic fat stranding in the distal sigmoid colon,
cannot exclude acute sigmoid diverticulitis. No free air or abscess.
3. Chronic findings include: Aortic Atherosclerosis ([id number]).
Coronary atherosclerosis. Small hiatal hernia. Mild prostatomegaly.

## 2020-06-14 DIAGNOSIS — D0430 Carcinoma in situ of skin of unspecified part of face: Secondary | ICD-10-CM | POA: Diagnosis not present

## 2020-06-14 DIAGNOSIS — D0462 Carcinoma in situ of skin of left upper limb, including shoulder: Secondary | ICD-10-CM | POA: Diagnosis not present

## 2020-06-25 ENCOUNTER — Ambulatory Visit: Payer: Medicare Other | Admitting: Orthopaedic Surgery

## 2020-07-04 ENCOUNTER — Ambulatory Visit: Payer: Medicare Other | Admitting: Orthopaedic Surgery

## 2020-07-31 ENCOUNTER — Other Ambulatory Visit: Payer: Self-pay

## 2020-07-31 ENCOUNTER — Ambulatory Visit (HOSPITAL_COMMUNITY)
Admission: RE | Admit: 2020-07-31 | Discharge: 2020-07-31 | Disposition: A | Discharge status: Home / Self Care | Payer: Medicare Other | Source: Ambulatory Visit | Attending: Cardiology | Admitting: Cardiology

## 2020-07-31 DIAGNOSIS — I6523 Occlusion and stenosis of bilateral carotid arteries: Secondary | ICD-10-CM | POA: Diagnosis not present

## 2020-08-01 ENCOUNTER — Telehealth: Payer: Self-pay

## 2020-08-01 NOTE — Telephone Encounter (Signed)
Called pt and spoke to his wife, per pt's request.  Gave them MD results: Moderate carotid plaque bilaterally. Repeat study in 1 year. No questions/concerns at this time.

## 2020-08-02 ENCOUNTER — Other Ambulatory Visit: Payer: Self-pay | Admitting: *Deleted

## 2020-08-02 DIAGNOSIS — K5000 Crohn's disease of small intestine without complications: Secondary | ICD-10-CM | POA: Diagnosis not present

## 2020-08-02 DIAGNOSIS — R790 Abnormal level of blood mineral: Secondary | ICD-10-CM | POA: Diagnosis not present

## 2020-08-02 DIAGNOSIS — R634 Abnormal weight loss: Secondary | ICD-10-CM | POA: Diagnosis not present

## 2020-08-02 DIAGNOSIS — I6523 Occlusion and stenosis of bilateral carotid arteries: Secondary | ICD-10-CM

## 2020-08-03 ENCOUNTER — Other Ambulatory Visit: Payer: Self-pay | Admitting: Gastroenterology

## 2020-08-03 DIAGNOSIS — K50919 Crohn's disease, unspecified, with unspecified complications: Secondary | ICD-10-CM

## 2020-08-08 NOTE — Progress Notes (Unsigned)
Cardiology Office Note   Date:  08/09/2020   ID:  Colin Larson, DOB 16-Jul-1934, MRN [id number]  PCP:  Mayra Neer, MD    No chief complaint on file.  CAD  Wt Readings from Last 3 Encounters:  08/09/20 133 lb 9.6 oz (60.6 kg)  08/10/19 141 lb 6.4 oz (64.1 kg)  01/07/18 147 lb (66.7 kg)       History of Present Illness: Colin Larson is a 86 y.o. male    with history ofCAD (MI 2000 s/p BMS to prox/mid RCA, 70% lesion in abberant LCx that arises from right coronary cusp), carotid disease, mild AS, HTN, HLD, arthritis, Crohn's disease, hiatal hernia, GIB (from diverticulosis/Crohn's 2014), melanoma of the skin who presents for yearly routine follow-up. Last echo 2014: mild LVH, EF 60-65%, mild AS, mild MR. Carotid duplex 07/2016: 98-92% RICA, 1-19% LICA, >41% RECA.  Elevated LFTs in the past when taking more tylenol for back pain.  He had an episode of dizziness, HTN which prompted visit to the ER. Stroke ruled out. Potassium and Magnesium were low. MRI negative. HR noted to drop to 39 bpm.  Reports shoulder pain from arthritis. Has been holding off on meloxicam due to potential side effects.  He walks 2-3 miles/day.  He was evaluated for weight loss.  H has avoided COVID and had all of his vaccines.    Denies : Chest pain. Dizziness. Leg edema. Nitroglycerin use. Orthopnea. Palpitations. Paroxysmal nocturnal dyspnea. Shortness of breath. Syncope.    Past Medical History:  Diagnosis Date  . Anemia   . Arthritis   . Carotid artery disease ([place])   . Coronary artery disease    a. MI 2000 s/p BMS to prox/mid RCA, 70% lesion in abberant LCx that arises from right coronary cusp per Dr. Hassell Done note  . Crohn disease ([place])   . GI bleeding    2014 - from diverticulosis/Crohn's 2014  . Hiatal hernia    no surgery  . Hypercholesteremia   . Hypertension   . MI (myocardial infarction) ([place]) [id number]  . Mild aortic stenosis   . Mild mitral regurgitation   .  Skin cancer     Past Surgical History:  Procedure Laterality Date  . CHOLECYSTECTOMY    . CORONARY STENT PLACEMENT  [id number]  . INGUINAL HERNIA REPAIR     x 2     Current Outpatient Medications  Medication Sig Dispense Refill  . acetaminophen (TYLENOL) 500 MG tablet Take 1,000 mg by mouth every 6 (six) hours as needed for mild pain or moderate pain.    Marland Kitchen aspirin EC 81 MG tablet Take 81 mg by mouth daily.    Marland Kitchen atorvastatin (LIPITOR) 80 MG tablet Take 80 mg by mouth daily.    . balsalazide (COLAZAL) 750 MG capsule Take 2 capsules by mouth 2 (two) times daily.    . Magnesium 250 MG TABS Take 500 mg by mouth daily.    . meloxicam (MOBIC) 15 MG tablet Take 1 tablet (15 mg total) by mouth daily. 30 tablet 1  . Multiple Vitamin (MULTIVITAMIN WITH MINERALS) TABS tablet Take 1 tablet by mouth daily.    Marland Kitchen omeprazole (PRILOSEC) 20 MG capsule Take 20 mg by mouth daily.    . ramipril (ALTACE) 5 MG capsule Take 5 mg by mouth daily.    Marland Kitchen triamcinolone cream (KENALOG) 0.5 % Apply 1 application topically 3 (three) times daily as needed.    . triamcinolone lotion (KENALOG) 0.1 % Apply 1  application topically 3 (three) times daily as needed.     No current facility-administered medications for this visit.    Allergies:   Bee venom    Social History:  The patient  reports that he quit smoking about 45 years ago. He has a 0.70 pack-year smoking history. He has never used smokeless tobacco. He reports that he does not drink alcohol and does not use drugs.   Family History:  The patient's family history includes Cancer in his mother; Stroke in his father.    ROS:  Please see the history of present illness.   Otherwise, review of systems are positive for knee pain and shoulder pain.   All other systems are reviewed and negative.    PHYSICAL EXAM: VS:  BP 122/66   Pulse 62   Ht 5' 5"  (1.651 m)   Wt 133 lb 9.6 oz (60.6 kg)   SpO2 96%   BMI 22.23 kg/m  , BMI Body mass index is 22.23 kg/m. GEN:  Well nourished, well developed, in no acute distress  HEENT: normal  Neck: no JVD, carotid bruits, or masses Cardiac: RRR; 2/6 systolic murmur, no rubs, or gallops,no edema  Respiratory:  clear to auscultation bilaterally, normal work of breathing GI: soft, nontender, nondistended, + BS MS: no deformity or atrophy  Skin: warm and dry, no rash Neuro:  Strength and sensation are intact Psych: euthymic mood, full affect   EKG:   The ekg ordered today demonstrates NSR, RBBB   Recent Labs: 08/10/2019: ALT 21 08/19/2019: BUN 9; Creatinine, Ser 0.91; Magnesium 1.7; Potassium 4.0; Sodium 142   Lipid Panel No results found for: CHOL, TRIG, HDL, CHOLHDL, VLDL, LDLCALC, LDLDIRECT   Other studies Reviewed: Additional studies/ records that were reviewed today with results demonstrating: labs reviewed.   ASSESSMENT AND PLAN:  1. CAD: No angina.  Continue aggressive secondary prevention.  OK to stop meloxicam and use OTC tylenol.  2. CArotid artery disease: stable in 2/22.  Continue statin.  3. Mild AS: No sx of severe AS.   4. Hypertensive heart disease: The current medical regimen is effective;  continue present plan and medications.  Avoids excessive salt.     Current medicines are reviewed at length with the patient today.  The patient concerns regarding his medicines were addressed.  The following changes have been made:  No change  Labs/ tests ordered today include:  No orders of the defined types were placed in this encounter.   Recommend 150 minutes/week of aerobic exercise Low fat, low carb, high fiber diet recommended  Disposition:   FU in 1 year   Signed, Larae Grooms, MD  08/09/2020 2:18 PM    [facility] [address], [place], [place]  [address] Phone: [phone]; Fax: [phone]

## 2020-08-09 ENCOUNTER — Encounter: Payer: Self-pay | Admitting: Interventional Cardiology

## 2020-08-09 ENCOUNTER — Other Ambulatory Visit: Payer: Self-pay

## 2020-08-09 ENCOUNTER — Ambulatory Visit (INDEPENDENT_AMBULATORY_CARE_PROVIDER_SITE_OTHER): Payer: Medicare Other | Admitting: Interventional Cardiology

## 2020-08-09 DIAGNOSIS — I119 Hypertensive heart disease without heart failure: Secondary | ICD-10-CM

## 2020-08-09 DIAGNOSIS — I2510 Atherosclerotic heart disease of native coronary artery without angina pectoris: Secondary | ICD-10-CM

## 2020-08-09 DIAGNOSIS — I6523 Occlusion and stenosis of bilateral carotid arteries: Secondary | ICD-10-CM | POA: Diagnosis not present

## 2020-08-09 DIAGNOSIS — E785 Hyperlipidemia, unspecified: Secondary | ICD-10-CM

## 2020-08-09 DIAGNOSIS — I350 Nonrheumatic aortic (valve) stenosis: Secondary | ICD-10-CM | POA: Diagnosis not present

## 2020-08-09 NOTE — Patient Instructions (Signed)

## 2020-08-13 DIAGNOSIS — L905 Scar conditions and fibrosis of skin: Secondary | ICD-10-CM | POA: Diagnosis not present

## 2020-08-13 DIAGNOSIS — Z85828 Personal history of other malignant neoplasm of skin: Secondary | ICD-10-CM | POA: Diagnosis not present

## 2020-08-13 DIAGNOSIS — Z85820 Personal history of malignant melanoma of skin: Secondary | ICD-10-CM | POA: Diagnosis not present

## 2020-08-13 DIAGNOSIS — C44329 Squamous cell carcinoma of skin of other parts of face: Secondary | ICD-10-CM | POA: Diagnosis not present

## 2020-08-13 DIAGNOSIS — L821 Other seborrheic keratosis: Secondary | ICD-10-CM | POA: Diagnosis not present

## 2020-08-13 DIAGNOSIS — L814 Other melanin hyperpigmentation: Secondary | ICD-10-CM | POA: Diagnosis not present

## 2020-08-13 DIAGNOSIS — D485 Neoplasm of uncertain behavior of skin: Secondary | ICD-10-CM | POA: Diagnosis not present

## 2020-08-21 ENCOUNTER — Other Ambulatory Visit: Payer: Self-pay

## 2020-08-21 ENCOUNTER — Ambulatory Visit
Admission: RE | Admit: 2020-08-21 | Discharge: 2020-08-21 | Disposition: A | Discharge status: Home / Self Care | Payer: Medicare Other | Source: Ambulatory Visit | Attending: Gastroenterology | Admitting: Gastroenterology

## 2020-08-21 DIAGNOSIS — Z85828 Personal history of other malignant neoplasm of skin: Secondary | ICD-10-CM | POA: Diagnosis not present

## 2020-08-21 DIAGNOSIS — K449 Diaphragmatic hernia without obstruction or gangrene: Secondary | ICD-10-CM | POA: Diagnosis not present

## 2020-08-21 DIAGNOSIS — K5750 Diverticulosis of both small and large intestine without perforation or abscess without bleeding: Secondary | ICD-10-CM | POA: Diagnosis not present

## 2020-08-21 DIAGNOSIS — I700 Atherosclerosis of aorta: Secondary | ICD-10-CM | POA: Diagnosis not present

## 2020-08-21 DIAGNOSIS — K50919 Crohn's disease, unspecified, with unspecified complications: Secondary | ICD-10-CM

## 2020-08-21 MED ORDER — IOPAMIDOL (ISOVUE-300) INJECTION 61%
100.0000 mL | Freq: Once | INTRAVENOUS | Status: AC | PRN
  Administered 2020-08-21: 100 mL via INTRAVENOUS

## 2020-08-27 ENCOUNTER — Ambulatory Visit (INDEPENDENT_AMBULATORY_CARE_PROVIDER_SITE_OTHER): Payer: Medicare Other | Admitting: Physician Assistant

## 2020-08-27 ENCOUNTER — Encounter: Payer: Self-pay | Admitting: Physician Assistant

## 2020-08-27 ENCOUNTER — Other Ambulatory Visit: Payer: Self-pay

## 2020-08-27 DIAGNOSIS — M1711 Unilateral primary osteoarthritis, right knee: Secondary | ICD-10-CM

## 2020-08-27 DIAGNOSIS — I6523 Occlusion and stenosis of bilateral carotid arteries: Secondary | ICD-10-CM

## 2020-08-27 MED ORDER — METHYLPREDNISOLONE ACETATE 40 MG/ML IJ SUSP
40.0000 mg | INTRAMUSCULAR | Status: AC | PRN
  Administered 2020-08-27: 40 mg via INTRA_ARTICULAR

## 2020-08-27 MED ORDER — LIDOCAINE HCL 1 % IJ SOLN
3.0000 mL | INTRAMUSCULAR | Status: AC | PRN
  Administered 2020-08-27: 3 mL

## 2020-08-27 NOTE — Progress Notes (Signed)
   Procedure Note  Patient: Colin Larson             Date of Birth: 1935-02-01           MRN: [id number]             Visit Date: 08/27/2020 HPI: [name] comes in today requesting injection in his right knee.  He has known osteoarthritis right knee.  Denies any recent injuries.  Denies any fevers or chills.  Was last given an injection in the knee in 2020 is done well until recently.  Review of systems: See HPI otherwise negative or noncontributory.  Physical exam: Right knee no abnormal warmth erythema.  Slight crepitus with passive range of motion.  Good range of motion  Procedures: Visit Diagnoses:  1. Unilateral primary osteoarthritis, right knee     Large Joint Inj: R knee on 08/27/2020 1:52 PM Indications: pain Details: 22 G 1.5 in needle, anterolateral approach  Arthrogram: No  Medications: 3 mL lidocaine 1 %; 40 mg methylPREDNISolone acetate 40 MG/ML Outcome: tolerated well, no immediate complications Procedure, treatment alternatives, risks and benefits explained, specific risks discussed. Consent was given by the patient. Immediately prior to procedure a time out was called to verify the correct patient, procedure, equipment, support staff and site/side marked as required. Patient was prepped and draped in the usual sterile fashion.     Plan: He will follow up with [place] as needed.  Knows to wait at least 3 months between injections.  Questions were encouraged and answered.

## 2020-09-04 DIAGNOSIS — C44329 Squamous cell carcinoma of skin of other parts of face: Secondary | ICD-10-CM | POA: Diagnosis not present

## 2020-09-06 DIAGNOSIS — H6121 Impacted cerumen, right ear: Secondary | ICD-10-CM | POA: Diagnosis not present

## 2020-09-18 DIAGNOSIS — D0439 Carcinoma in situ of skin of other parts of face: Secondary | ICD-10-CM | POA: Diagnosis not present

## 2020-09-18 DIAGNOSIS — D485 Neoplasm of uncertain behavior of skin: Secondary | ICD-10-CM | POA: Diagnosis not present

## 2020-09-18 DIAGNOSIS — C44329 Squamous cell carcinoma of skin of other parts of face: Secondary | ICD-10-CM | POA: Diagnosis not present

## 2020-09-27 DIAGNOSIS — E782 Mixed hyperlipidemia: Secondary | ICD-10-CM | POA: Diagnosis not present

## 2020-09-27 DIAGNOSIS — Z0000 Encounter for general adult medical examination without abnormal findings: Secondary | ICD-10-CM | POA: Diagnosis not present

## 2020-09-27 DIAGNOSIS — I700 Atherosclerosis of aorta: Secondary | ICD-10-CM | POA: Diagnosis not present

## 2020-09-27 DIAGNOSIS — I25119 Atherosclerotic heart disease of native coronary artery with unspecified angina pectoris: Secondary | ICD-10-CM | POA: Diagnosis not present

## 2020-09-27 DIAGNOSIS — Z85820 Personal history of malignant melanoma of skin: Secondary | ICD-10-CM | POA: Diagnosis not present

## 2020-09-27 DIAGNOSIS — I119 Hypertensive heart disease without heart failure: Secondary | ICD-10-CM | POA: Diagnosis not present

## 2020-09-27 DIAGNOSIS — K5090 Crohn's disease, unspecified, without complications: Secondary | ICD-10-CM | POA: Diagnosis not present

## 2020-09-27 DIAGNOSIS — K219 Gastro-esophageal reflux disease without esophagitis: Secondary | ICD-10-CM | POA: Diagnosis not present

## 2020-09-27 DIAGNOSIS — R413 Other amnesia: Secondary | ICD-10-CM | POA: Diagnosis not present

## 2020-10-09 DIAGNOSIS — D485 Neoplasm of uncertain behavior of skin: Secondary | ICD-10-CM | POA: Diagnosis not present

## 2020-10-09 DIAGNOSIS — D0439 Carcinoma in situ of skin of other parts of face: Secondary | ICD-10-CM | POA: Diagnosis not present

## 2020-10-09 DIAGNOSIS — C44329 Squamous cell carcinoma of skin of other parts of face: Secondary | ICD-10-CM | POA: Diagnosis not present

## 2020-12-02 ENCOUNTER — Other Ambulatory Visit: Payer: Self-pay

## 2020-12-02 ENCOUNTER — Inpatient Hospital Stay (HOSPITAL_COMMUNITY)
Admission: EM | Admit: 2020-12-02 | Discharge: 2020-12-06 | DRG: 178 | Disposition: A | Discharge status: Home Health | Payer: Medicare Other | Attending: Internal Medicine | Admitting: Internal Medicine

## 2020-12-02 ENCOUNTER — Emergency Department (HOSPITAL_COMMUNITY): Payer: Medicare Other

## 2020-12-02 ENCOUNTER — Encounter (HOSPITAL_COMMUNITY): Payer: Self-pay

## 2020-12-02 DIAGNOSIS — R61 Generalized hyperhidrosis: Secondary | ICD-10-CM | POA: Diagnosis not present

## 2020-12-02 DIAGNOSIS — E860 Dehydration: Secondary | ICD-10-CM | POA: Diagnosis present

## 2020-12-02 DIAGNOSIS — R9431 Abnormal electrocardiogram [ECG] [EKG]: Secondary | ICD-10-CM | POA: Diagnosis not present

## 2020-12-02 DIAGNOSIS — R0902 Hypoxemia: Secondary | ICD-10-CM | POA: Diagnosis not present

## 2020-12-02 DIAGNOSIS — R509 Fever, unspecified: Secondary | ICD-10-CM | POA: Diagnosis not present

## 2020-12-02 DIAGNOSIS — K219 Gastro-esophageal reflux disease without esophagitis: Secondary | ICD-10-CM | POA: Diagnosis present

## 2020-12-02 DIAGNOSIS — I2510 Atherosclerotic heart disease of native coronary artery without angina pectoris: Secondary | ICD-10-CM | POA: Diagnosis present

## 2020-12-02 DIAGNOSIS — I252 Old myocardial infarction: Secondary | ICD-10-CM

## 2020-12-02 DIAGNOSIS — Z955 Presence of coronary angioplasty implant and graft: Secondary | ICD-10-CM

## 2020-12-02 DIAGNOSIS — U071 COVID-19: Principal | ICD-10-CM | POA: Diagnosis present

## 2020-12-02 DIAGNOSIS — E782 Mixed hyperlipidemia: Secondary | ICD-10-CM | POA: Diagnosis not present

## 2020-12-02 DIAGNOSIS — I10 Essential (primary) hypertension: Secondary | ICD-10-CM | POA: Diagnosis not present

## 2020-12-02 DIAGNOSIS — E871 Hypo-osmolality and hyponatremia: Secondary | ICD-10-CM | POA: Diagnosis not present

## 2020-12-02 DIAGNOSIS — I4510 Unspecified right bundle-branch block: Secondary | ICD-10-CM | POA: Diagnosis not present

## 2020-12-02 DIAGNOSIS — E872 Acidosis, unspecified: Secondary | ICD-10-CM | POA: Diagnosis present

## 2020-12-02 DIAGNOSIS — A419 Sepsis, unspecified organism: Secondary | ICD-10-CM | POA: Diagnosis not present

## 2020-12-02 DIAGNOSIS — Z85820 Personal history of malignant melanoma of skin: Secondary | ICD-10-CM

## 2020-12-02 DIAGNOSIS — Z7982 Long term (current) use of aspirin: Secondary | ICD-10-CM

## 2020-12-02 DIAGNOSIS — R070 Pain in throat: Secondary | ICD-10-CM | POA: Diagnosis not present

## 2020-12-02 DIAGNOSIS — E861 Hypovolemia: Secondary | ICD-10-CM | POA: Diagnosis present

## 2020-12-02 DIAGNOSIS — Z87891 Personal history of nicotine dependence: Secondary | ICD-10-CM

## 2020-12-02 DIAGNOSIS — Z79899 Other long term (current) drug therapy: Secondary | ICD-10-CM

## 2020-12-02 LAB — COMPREHENSIVE METABOLIC PANEL WITH GFR
ALT: 19 U/L (ref 0–44)
AST: 45 U/L — ABNORMAL HIGH (ref 15–41)
Albumin: 2.7 g/dL — ABNORMAL LOW (ref 3.5–5.0)
Alkaline Phosphatase: 51 U/L (ref 38–126)
Anion gap: 7 (ref 5–15)
BUN: 14 mg/dL (ref 8–23)
CO2: 23 mmol/L (ref 22–32)
Calcium: 8.3 mg/dL — ABNORMAL LOW (ref 8.9–10.3)
Chloride: 99 mmol/L (ref 98–111)
Creatinine, Ser: 1.06 mg/dL (ref 0.61–1.24)
GFR, Estimated: >60 mL/min
Glucose, Bld: 126 mg/dL — ABNORMAL HIGH (ref 70–99)
Potassium: 3.8 mmol/L (ref 3.5–5.1)
Sodium: 129 mmol/L — ABNORMAL LOW (ref 135–145)
Total Bilirubin: 0.6 mg/dL (ref 0.3–1.2)
Total Protein: 5.4 g/dL — ABNORMAL LOW (ref 6.5–8.1)

## 2020-12-02 LAB — CBC WITH DIFFERENTIAL/PLATELET
Abs Immature Granulocytes: 0.03 K/uL (ref 0.00–0.07)
Basophils Absolute: 0 K/uL (ref 0.0–0.1)
Basophils Relative: 0 %
Eosinophils Absolute: 0 K/uL (ref 0.0–0.5)
Eosinophils Relative: 0 %
HCT: 32.3 % — ABNORMAL LOW (ref 39.0–52.0)
Hemoglobin: 10.5 g/dL — ABNORMAL LOW (ref 13.0–17.0)
Immature Granulocytes: 1 %
Lymphocytes Relative: 9 %
Lymphs Abs: 0.4 K/uL — ABNORMAL LOW (ref 0.7–4.0)
MCH: 30.3 pg (ref 26.0–34.0)
MCHC: 32.5 g/dL (ref 30.0–36.0)
MCV: 93.1 fL (ref 80.0–100.0)
Monocytes Absolute: 0.4 K/uL (ref 0.1–1.0)
Monocytes Relative: 9 %
Neutro Abs: 3.6 K/uL (ref 1.7–7.7)
Neutrophils Relative %: 81 %
Platelets: 162 K/uL (ref 150–400)
RBC: 3.47 MIL/uL — ABNORMAL LOW (ref 4.22–5.81)
RDW: 13.2 % (ref 11.5–15.5)
WBC: 4.5 K/uL (ref 4.0–10.5)
nRBC: 0 % (ref 0.0–0.2)

## 2020-12-02 LAB — URINALYSIS, ROUTINE W REFLEX MICROSCOPIC
Bilirubin Urine: NEGATIVE
Glucose, UA: NEGATIVE mg/dL
Hgb urine dipstick: NEGATIVE
Ketones, ur: NEGATIVE mg/dL
Leukocytes,Ua: NEGATIVE
Nitrite: NEGATIVE
Protein, ur: NEGATIVE mg/dL
Specific Gravity, Urine: 1.02 (ref 1.005–1.030)
pH: 5.5 (ref 5.0–8.0)

## 2020-12-02 LAB — PROTIME-INR
INR: 1.3 — ABNORMAL HIGH (ref 0.8–1.2)
Prothrombin Time: 15.7 s — ABNORMAL HIGH (ref 11.4–15.2)

## 2020-12-02 LAB — RESP PANEL BY RT-PCR (FLU A&B, COVID) ARPGX2
Influenza A by PCR: NEGATIVE
Influenza B by PCR: NEGATIVE
SARS Coronavirus 2 by RT PCR: POSITIVE — AB

## 2020-12-02 LAB — LACTIC ACID, PLASMA
Lactic Acid, Venous: 2.4 mmol/L (ref 0.5–1.9)
Lactic Acid, Venous: 1.4 mmol/L (ref 0.5–1.9)

## 2020-12-02 LAB — APTT: aPTT: 27 s (ref 24–36)

## 2020-12-02 MED ORDER — RAMIPRIL 5 MG PO CAPS
5.0000 mg | ORAL_CAPSULE | Freq: Every day | ORAL | Status: DC
  Administered 2020-12-03 – 2020-12-06 (×4): 5 mg via ORAL
  Filled 2020-12-02 (×4): qty 1

## 2020-12-02 MED ORDER — METRONIDAZOLE 500 MG/100ML IV SOLN
500.0000 mg | Freq: Once | INTRAVENOUS | Status: AC
  Administered 2020-12-02: 500 mg via INTRAVENOUS
  Filled 2020-12-02: qty 100

## 2020-12-02 MED ORDER — VANCOMYCIN HCL 1250 MG/250ML IV SOLN
1250.0000 mg | Freq: Once | INTRAVENOUS | Status: AC
  Administered 2020-12-02: 1250 mg via INTRAVENOUS
  Filled 2020-12-02: qty 250

## 2020-12-02 MED ORDER — VANCOMYCIN HCL 750 MG/150ML IV SOLN: 750.0000 mg | INTRAVENOUS | Status: DC

## 2020-12-02 MED ORDER — ASPIRIN EC 81 MG PO TBEC
81.0000 mg | DELAYED_RELEASE_TABLET | Freq: Every day | ORAL | Status: DC
  Administered 2020-12-03 – 2020-12-06 (×4): 81 mg via ORAL
  Filled 2020-12-02 (×4): qty 1

## 2020-12-02 MED ORDER — ZINC SULFATE 220 (50 ZN) MG PO CAPS
220.0000 mg | ORAL_CAPSULE | Freq: Every day | ORAL | Status: DC
  Administered 2020-12-03 – 2020-12-06 (×4): 220 mg via ORAL
  Filled 2020-12-02 (×4): qty 1

## 2020-12-02 MED ORDER — SODIUM CHLORIDE 0.9 % IV SOLN
200.0000 mg | Freq: Once | INTRAVENOUS | Status: AC
  Administered 2020-12-02: 200 mg via INTRAVENOUS
  Filled 2020-12-02: qty 40

## 2020-12-02 MED ORDER — ALBUTEROL SULFATE HFA 108 (90 BASE) MCG/ACT IN AERS
2.0000 | INHALATION_SPRAY | RESPIRATORY_TRACT | Status: DC | PRN
  Administered 2020-12-05 – 2020-12-06 (×2): 2 via RESPIRATORY_TRACT
  Filled 2020-12-02: qty 6.7

## 2020-12-02 MED ORDER — PANTOPRAZOLE SODIUM 40 MG PO TBEC
40.0000 mg | DELAYED_RELEASE_TABLET | Freq: Every day | ORAL | Status: DC
  Administered 2020-12-03 – 2020-12-06 (×4): 40 mg via ORAL
  Filled 2020-12-02 (×4): qty 1

## 2020-12-02 MED ORDER — LACTATED RINGERS IV SOLN: INTRAVENOUS | Status: AC

## 2020-12-02 MED ORDER — SODIUM CHLORIDE 0.9 % IV BOLUS (SEPSIS)
1000.0000 mL | Freq: Once | INTRAVENOUS | Status: AC
Start: 2020-12-02 — End: 2020-12-02
  Administered 2020-12-02: 1000 mL via INTRAVENOUS

## 2020-12-02 MED ORDER — ONDANSETRON HCL 4 MG PO TABS: 4.0000 mg | ORAL_TABLET | Freq: Four times a day (QID) | ORAL | Status: DC | PRN

## 2020-12-02 MED ORDER — SODIUM CHLORIDE 0.9 % IV SOLN: 2.0000 g | Freq: Two times a day (BID) | INTRAVENOUS | Status: DC

## 2020-12-02 MED ORDER — ACETAMINOPHEN 325 MG PO TABS: 650.0000 mg | ORAL_TABLET | Freq: Four times a day (QID) | ORAL | Status: DC | PRN

## 2020-12-02 MED ORDER — BALSALAZIDE DISODIUM 750 MG PO CAPS
1500.0000 mg | ORAL_CAPSULE | Freq: Two times a day (BID) | ORAL | Status: DC
  Administered 2020-12-02 – 2020-12-06 (×8): 1500 mg via ORAL
  Filled 2020-12-02 (×10): qty 2

## 2020-12-02 MED ORDER — ONDANSETRON HCL 4 MG/2ML IJ SOLN: 4.0000 mg | Freq: Four times a day (QID) | INTRAMUSCULAR | Status: DC | PRN

## 2020-12-02 MED ORDER — POLYETHYLENE GLYCOL 3350 17 G PO PACK
17.0000 g | PACK | Freq: Every day | ORAL | Status: DC | PRN
Start: 2020-12-02 — End: 2020-12-06

## 2020-12-02 MED ORDER — ENOXAPARIN SODIUM 40 MG/0.4ML IJ SOSY
40.0000 mg | PREFILLED_SYRINGE | INTRAMUSCULAR | Status: DC
  Administered 2020-12-02 – 2020-12-05 (×4): 40 mg via SUBCUTANEOUS
  Filled 2020-12-02 (×4): qty 0.4

## 2020-12-02 MED ORDER — GUAIFENESIN-DM 100-10 MG/5ML PO SYRP: 10.0000 mL | ORAL_SOLUTION | ORAL | Status: DC | PRN

## 2020-12-02 MED ORDER — SODIUM CHLORIDE 0.9 % IV SOLN
100.0000 mg | Freq: Every day | INTRAVENOUS | Status: AC
  Administered 2020-12-03 – 2020-12-04 (×2): 100 mg via INTRAVENOUS
  Filled 2020-12-02 (×2): qty 20

## 2020-12-02 MED ORDER — ASCORBIC ACID 500 MG PO TABS
500.0000 mg | ORAL_TABLET | Freq: Every day | ORAL | Status: DC
  Administered 2020-12-03 – 2020-12-06 (×4): 500 mg via ORAL
  Filled 2020-12-02 (×4): qty 1

## 2020-12-02 MED ORDER — LACTATED RINGERS IV SOLN: INTRAVENOUS | Status: DC

## 2020-12-02 MED ORDER — ATORVASTATIN CALCIUM 80 MG PO TABS
80.0000 mg | ORAL_TABLET | Freq: Every day | ORAL | Status: DC
  Administered 2020-12-03 – 2020-12-06 (×4): 80 mg via ORAL
  Filled 2020-12-02 (×5): qty 1

## 2020-12-02 MED ORDER — SODIUM CHLORIDE 0.9 % IV SOLN
2.0000 g | Freq: Once | INTRAVENOUS | Status: AC
  Administered 2020-12-02: 2 g via INTRAVENOUS
  Filled 2020-12-02: qty 2

## 2020-12-02 NOTE — ED Triage Notes (Signed)
Pt bib [facility] from home with complaints of episode of increased lethargy and chills this am. Family reports upon awakening, pt was pale and diaphoretic. Family gave pt one nitroglycerin before ems arriving. Pt is at baseline now. Denies pain  EMS vitals: 100.5 T, 122/67 BP, 69 HR, 95% RA, 112 CBG,  1066m tylenol and 500 NS bolus given

## 2020-12-02 NOTE — ED Provider Notes (Signed)
[facility] EMERGENCY DEPARTMENT Provider Note   CSN: [id number] Arrival date & time: 12/02/20  1359     History No chief complaint on file.   Colin Larson is a 86 y.o. male.  HPI  This patient is a very pleasant 86 year old male he has a history of Crohn's disease, prior heart attack, history of GI bleeding related to his Crohn's disease and diverticulosis.  He currently takes medications including balsalazide, omeprazole, atorvastatin.  He also takes ramipril.  The patient was in his usual state of health this morning, he was sitting on the couch when he felt like he fell asleep.  His daughter called 911 because she could not wake him up for at least 5 minutes.  She was trying very hard and he would not respond.  He appeared to be breathing.  Paramedics arrived and found the patient to be weak, hypotensive and had a temperature of 100.7.  The patient denies having any infectious symptoms including sore throat, headache, stiff neck, rash, chest pain, abdominal pain, back pain, neck pain, dysuria, diarrhea.  He states he does have intermittent diarrhea secondary to his Crohn's disease but not recently.  There has been no tick bites, he has not been exposed anybody who has been sick, his wife is not sick at this time, he has not had any recent travel.  He is up-to-date on his vaccinations except for his COVID booster.  The patient does recall having some chills this morning but does not have those at this time and at this time states he is feeling at his normal level of health.   Past Medical History:  Diagnosis Date   Anemia    Arthritis    Carotid artery disease ([place])    Coronary artery disease    a. MI 2000 s/p BMS to prox/mid RCA, 70% lesion in abberant LCx that arises from right coronary cusp per Dr. Hassell Done note   Crohn disease ([place])    GI bleeding    2014 - from diverticulosis/Crohn's 2014   Hiatal hernia    no surgery   Hypercholesteremia     Hypertension    MI (myocardial infarction) ([place]) [id number]   Mild aortic stenosis    Mild mitral regurgitation    Skin cancer     Patient Active Problem List   Diagnosis Date Noted   Bradycardia 12/01/2017   Dizziness 11/30/2017   Elevated LFTs 08/24/2017   Unilateral primary osteoarthritis, right knee 02/25/2017   Chronic pain of right knee 02/25/2017   Unilateral primary osteoarthritis, left knee 02/25/2017   History of anemia 07/30/2016   Carotid artery disease ([facility])    GI bleeding    Mild aortic stenosis    Mild mitral regurgitation    Mixed hyperlipidemia 05/04/2013   GERD (gastroesophageal reflux disease) 05/04/2013   Diverticulosis of colon with hemorrhage 05/04/2013   Coronary artery disease 05/04/2013   Crohn disease ([place]) 05/04/2013   Melanoma ([place]) 01/07/2012   Essential hypertension 12/29/2011   Hypercholesteremia 12/29/2011    Past Surgical History:  Procedure Laterality Date   CHOLECYSTECTOMY     CORONARY STENT PLACEMENT  [id number]   INGUINAL HERNIA REPAIR     x 2       Family History  Problem Relation Age of Onset   Cancer Mother    Stroke Father     Social History   Tobacco Use   Smoking status: Former    Packs/day: 0.14    Years: 5.00  Pack years: 0.70    Types: Cigarettes    Quit date: 02/15/1975    Years since quitting: 45.8   Smokeless tobacco: Never  Substance Use Topics   Alcohol use: No   Drug use: No    Home Medications Prior to Admission medications   Medication Sig Start Date End Date Taking? Authorizing Provider  acetaminophen (TYLENOL) 500 MG tablet Take 1,000 mg by mouth every 6 (six) hours as needed for mild pain or moderate pain.    [provider]  aspirin EC 81 MG tablet Take 81 mg by mouth daily.    [provider]  atorvastatin (LIPITOR) 80 MG tablet Take 80 mg by mouth daily. 06/10/17   [provider]  balsalazide (COLAZAL) 750 MG capsule Take 2 capsules by mouth 2 (two) times daily.  08/12/17   [provider]  Magnesium 250 MG TABS Take 500 mg by mouth daily.    [provider]  meloxicam (MOBIC) 15 MG tablet Take 1 tablet (15 mg total) by mouth daily. 05/28/20   Mcarthur Rossetti, MD  Multiple Vitamin (MULTIVITAMIN WITH MINERALS) TABS tablet Take 1 tablet by mouth daily.    [provider]  omeprazole (PRILOSEC) 20 MG capsule Take 20 mg by mouth daily. 08/12/17   [provider]  ramipril (ALTACE) 5 MG capsule Take 5 mg by mouth daily.    [provider]  triamcinolone cream (KENALOG) 0.5 % Apply 1 application topically 3 (three) times daily as needed.    [provider]  triamcinolone lotion (KENALOG) 0.1 % Apply 1 application topically 3 (three) times daily as needed.    [provider]    Allergies    Bee venom  Review of Systems   Review of Systems  All other systems reviewed and are negative.  Physical Exam Updated Vital Signs BP (!) 96/53 (BP Location: Right Arm)   Pulse 75   Temp 99.4 F (37.4 C) (Oral)   Resp (!) 22   Ht 1.651 m (5' 5" )   Wt 60.3 kg   SpO2 100%   BMI 22.13 kg/m   Physical Exam Vitals and nursing note reviewed.  Constitutional:      General: He is not in acute distress.    Appearance: He is well-developed.  HENT:     Head: Normocephalic and atraumatic.     Nose: No congestion or rhinorrhea.     Mouth/Throat:     Mouth: Mucous membranes are moist.     Pharynx: Oropharynx is clear. No oropharyngeal exudate.  Eyes:     General: No scleral icterus.       Right eye: No discharge.        Left eye: No discharge.     Conjunctiva/sclera: Conjunctivae normal.     Pupils: Pupils are equal, round, and reactive to light.  Neck:     Thyroid: No thyromegaly.     Vascular: No JVD.  Cardiovascular:     Rate and Rhythm: Normal rate and regular rhythm.     Heart sounds: Normal heart sounds. No murmur heard.   No friction rub. No gallop.  Pulmonary:     Effort:  Pulmonary effort is normal. No respiratory distress.     Breath sounds: Normal breath sounds. No wheezing or rales.  Abdominal:     General: Bowel sounds are normal. There is no distension.     Palpations: Abdomen is soft. There is no mass.     Tenderness: There is no  abdominal tenderness.  Musculoskeletal:        General: No tenderness. Normal range of motion.     Cervical back: Normal range of motion and neck supple.     Right lower leg: No edema.     Left lower leg: No edema.  Lymphadenopathy:     Cervical: No cervical adenopathy.  Skin:    General: Skin is warm and dry.     Findings: No erythema or rash.  Neurological:     Mental Status: He is alert.     Coordination: Coordination normal.     Comments: Normal speech, normal facial symmetry, cranial nerves III through XII are normal.  He has normal strength in all 4 extremities, symmetrical arms, symmetrical legs, normal finger-nose-finger, answers questions appropriately, memory seems to be intact.  Psychiatric:        Behavior: Behavior normal.    ED Results / Procedures / Treatments   Labs (all labs ordered are listed, but only abnormal results are displayed) Labs Reviewed  RESP PANEL BY RT-PCR (FLU A&B, COVID) ARPGX2  CULTURE, BLOOD (ROUTINE X 2)  LACTIC ACID, PLASMA  LACTIC ACID, PLASMA  COMPREHENSIVE METABOLIC PANEL  CBC WITH DIFFERENTIAL/PLATELET  PROTIME-INR  APTT    EKG None  Radiology No results found.  Procedures Procedures   Medications Ordered in ED Medications  lactated ringers infusion (has no administration in time range)  sodium chloride 0.9 % bolus 1,000 mL (has no administration in time range)    And  sodium chloride 0.9 % bolus 1,000 mL (has no administration in time range)  ceFEPIme (MAXIPIME) 2 g in sodium chloride 0.9 % 100 mL IVPB (has no administration in time range)  metroNIDAZOLE (FLAGYL) IVPB 500 mg (has no administration in time range)  vancomycin (VANCOREADY) IVPB 1000 mg/200 mL  (has no administration in time range)    ED Course  I have reviewed the triage vital signs and the nursing notes.  Pertinent labs & imaging results that were available during my care of the patient were reviewed by me and considered in my medical decision making (see chart for details).    MDM Rules/Calculators/A&P                          The patient is slightly hypotensive with a blood pressure of 96/53, temperature was 100.7, he is not in any respiratory distress and has no obvious source of infection on his exam.  He is on medications for Crohn's disease including the balsalazide as an anti-inflammatory.  No other immunosuppressive medications.  Will follow testing with sepsis protocol including blood cultures urine culture chest x-ray and blood work.  The patient is agreeable to the plan, IV fluids given for hypotension currently measured at 96/53.  Antibiotics ordered for presumed infection  At the time of change of shift the patient was signed out to oncoming physician Dr. Regenia Skeeter to follow-up results and disposition accordingly  Final Clinical Impression(s) / ED Diagnoses Final diagnoses:  COVID-19    Rx / DC Orders ED Discharge Orders     None        Noemi Chapel, MD 12/04/20 [phone]

## 2020-12-02 NOTE — H&P (Signed)
History and Physical    Colin Larson MEQ:[id number] DOB: 1935-05-08 DOA: 12/02/2020  PCP: Mayra Neer, MD  Patient coming from: Home   Chief Complaint:  Chief Complaint  Patient presents with   Code Sepsis     HPI:    86 year old male with a past medical history of Crohn's disease, hypertension, mild aortic stenosis, coronary artery disease (MI 2000 S/P BMS to RCA), hyperlipidemia, hiatal hernia, history of GI bleeding (diverticular 2014) and history of melanoma who presents to [facility] emergency department with complaints of generalized weakness and lethargy.  Patient explains that for approximately the past 5 days he has noticed that he has had a extremely poor appetite.  His poor appetite has been associated with a generalized feeling of malaise weakness and lethargy.  Patient symptoms have gradually worsened over the following 5 days and become more more severe.  As patient's symptoms have worsened he has developed associated frequent bouts of nausea without vomiting.  Patient is also developing associated chills.  Other questioning patient denies fevers, sick contacts, recent travel, shortness of breath, cough, anosmia or ageusia.  Patient denies having any recent contact with confirmed COVID-19 infection.  Patient is at his continued to worsen to the point where he could no longer walk.  Because of this intense weakness EMS was contacted and the patient was promptly evaluated and found to be febrile with temperature of 100.5 F.  Patient was administered a 500 cc normal saline bolus followed by 1 g of Tylenol and was brought into [facility] emergency department for evaluation.    Upon evaluation in the emergency department, initial blood pressure was found to be somewhat hypotensive and 96/53.  COVID-19 PCR testing was found to be positive.  Patient was found to be persistently febrile with at 100.7 F.  Patient also found to be 7 from a lactic acidosis  of 2.4 and somewhat hyponatremic with a sodium of 129.  Due to hypotension, lactic acidosis and persisting fevers ER provider is requesting hospitalist group evaluate patient for admission to the hospital.  Review of Systems:   Review of Systems  Constitutional:  Positive for chills and fever.  Gastrointestinal:  Positive for nausea.  Neurological:  Positive for weakness.  All other systems reviewed and are negative.  Past Medical History:  Diagnosis Date   Anemia    Arthritis    Carotid artery disease ([place])    Coronary artery disease    a. MI 2000 s/p BMS to prox/mid RCA, 70% lesion in abberant LCx that arises from right coronary cusp per Dr. Hassell Done note   Crohn disease [facility])    GI bleeding    2014 - from diverticulosis/Crohn's 2014   Hiatal hernia    no surgery   Hypercholesteremia    Hypertension    Melanoma ([place]) 01/07/2012   MI (myocardial infarction) [facility]) [id number]   Mild aortic stenosis    Mild mitral regurgitation    Skin cancer     Past Surgical History:  Procedure Laterality Date   CHOLECYSTECTOMY     CORONARY STENT PLACEMENT  [id number]   INGUINAL HERNIA REPAIR     x 2     reports that he quit smoking about 45 years ago. He has a 0.70 pack-year smoking history. He has never used smokeless tobacco. He reports that he does not drink alcohol and does not use drugs.  Allergies  Allergen Reactions   Bee Venom Anaphylaxis and Swelling    Family History  Problem Relation Age of Onset   Cancer Mother    Stroke Father      Prior to Admission medications   Medication Sig Start Date End Date Taking? Authorizing Provider  acetaminophen (TYLENOL) 500 MG tablet Take 1,000 mg by mouth every 6 (six) hours as needed for mild pain or moderate pain.    [provider]  aspirin EC 81 MG tablet Take 81 mg by mouth daily.    [provider]  atorvastatin (LIPITOR) 80 MG tablet Take 80 mg by mouth daily. 06/10/17   [provider]  balsalazide  (COLAZAL) 750 MG capsule Take 2 capsules by mouth 2 (two) times daily. 08/12/17   [provider]  Magnesium 250 MG TABS Take 500 mg by mouth daily.    [provider]  meloxicam (MOBIC) 15 MG tablet Take 1 tablet (15 mg total) by mouth daily. 05/28/20   Mcarthur Rossetti, MD  Multiple Vitamin (MULTIVITAMIN WITH MINERALS) TABS tablet Take 1 tablet by mouth daily.    [provider]  omeprazole (PRILOSEC) 20 MG capsule Take 20 mg by mouth daily. 08/12/17   [provider]  ramipril (ALTACE) 5 MG capsule Take 5 mg by mouth daily.    [provider]  triamcinolone cream (KENALOG) 0.5 % Apply 1 application topically 3 (three) times daily as needed.    [provider]  triamcinolone lotion (KENALOG) 0.1 % Apply 1 application topically 3 (three) times daily as needed.    [provider]    Physical Exam: Vitals:   12/02/20 1900 12/02/20 2000 12/02/20 2015 12/02/20 2045  BP: (!) 120/57 (!) 101/55 (!) 112/55 (!) 117/55  Pulse: (!) 58 64 (!) 57 (!) 49  Resp: 18 (!) 22 20 10   Temp:      TempSrc:      SpO2: 96% 99% 99% 97%  Weight:      Height:        Constitutional: Lethargic but arousable and  oriented x3, no associated distress.   Skin: no rashes, no lesions, poor skin turgor noted. Eyes: Pupils are equally reactive to light.  No evidence of scleral icterus or conjunctival pallor.  ENMT: Dry mucous membranes noted.  Posterior pharynx clear of any exudate or lesions.   Neck: normal, supple, no masses, no thyromegaly.  No evidence of jugular venous distension.   Respiratory: Faint bibasilar rales without evidence of wheezing.  Normal respiratory effort. No accessory muscle use.  Cardiovascular: Regular rate and rhythm, no murmurs / rubs / gallops. No extremity edema. 2+ pedal pulses. No carotid bruits.  Chest:   Nontender without crepitus or deformity.   Back:   Nontender without crepitus or deformity. Abdomen: Abdomen is soft  and nontender.  No evidence of intra-abdominal masses.  Positive bowel sounds noted in all quadrants.   Musculoskeletal: No joint deformity upper and lower extremities. Good ROM, no contractures. Normal muscle tone.  Neurologic: CN 2-12 grossly intact. Sensation intact.  Patient moving all 4 extremities spontaneously.  Patient is following all commands.  Patient is responsive to verbal stimuli.   Psychiatric: Patient exhibits normal mood with appropriate affect.  Patient seems to possess insight as to their current situation.     Labs on Admission: I have personally reviewed following labs and imaging studies -   CBC: Recent Labs  Lab 12/02/20 1432  WBC 4.5  NEUTROABS 3.6  HGB 10.5*  HCT 32.3*  MCV 93.1  PLT 793   Basic Metabolic Panel: Recent Labs  Lab 12/02/20 1432  NA 129*  K 3.8  CL 99  CO2 23  GLUCOSE 126*  BUN 14  CREATININE 1.06  CALCIUM 8.3*   GFR: Estimated Creatinine Clearance: 45 mL/min (by C-G formula based on SCr of 1.06 mg/dL). Liver Function Tests: Recent Labs  Lab 12/02/20 1432  AST 45*  ALT 19  ALKPHOS 51  BILITOT 0.6  PROT 5.4*  ALBUMIN 2.7*   No results for input(s): LIPASE, AMYLASE in the last 168 hours. No results for input(s): AMMONIA in the last 168 hours. Coagulation Profile: Recent Labs  Lab 12/02/20 1432  INR 1.3*   Cardiac Enzymes: No results for input(s): CKTOTAL, CKMB, CKMBINDEX, TROPONINI in the last 168 hours. BNP (last 3 results) No results for input(s): PROBNP in the last 8760 hours. HbA1C: No results for input(s): HGBA1C in the last 72 hours. CBG: No results for input(s): GLUCAP in the last 168 hours. Lipid Profile: No results for input(s): CHOL, HDL, LDLCALC, TRIG, CHOLHDL, LDLDIRECT in the last 72 hours. Thyroid Function Tests: No results for input(s): TSH, T4TOTAL, FREET4, T3FREE, THYROIDAB in the last 72 hours. Anemia Panel: No results for input(s): VITAMINB12, FOLATE, FERRITIN, TIBC, IRON, RETICCTPCT in the last  72 hours. Urine analysis: No results found for: COLORURINE, APPEARANCEUR, LABSPEC, PHURINE, GLUCOSEU, HGBUR, BILIRUBINUR, KETONESUR, PROTEINUR, UROBILINOGEN, NITRITE, LEUKOCYTESUR  Radiological Exams on Admission - Personally Reviewed: DG Chest Port 1 View  Result Date: 12/02/2020 CLINICAL DATA:  Sepsis. EXAM: PORTABLE CHEST 1 VIEW COMPARISON:  Chest radiograph 11/30/2017. FINDINGS: Monitoring leads overlie the patient. Normal cardiac contours. No consolidative pulmonary opacities. No pleural effusion or pneumothorax. Thoracic spine degenerative changes. IMPRESSION: No acute cardiopulmonary process. Electronically Signed   By: Lovey Newcomer M.D.   On: 12/02/2020 15:22    EKG: Personally reviewed.  Rhythm is normal sinus rhythm with heart rate of 60 bpm.  Evidence of right bundle branch block.  No dynamic ST segment changes appreciated.  Assessment/Plan Principal Problem:   COVID-19 virus infection  Patient presenting with several day history of increasing generalized weakness lethargy and poor appetite Patient arriving with clinical evidence of dehydration with associated hyponatremia COVID-19 PCR found to be positive  Patient lacks respiratory symptoms or hypoxia the patient is at high risk of progression  Will treat patient with 3-day course of intravenous remdesivir  Droplet and contact isolation  As needed bronchodilator therapy for shortness of breath and wheezing  As needed antitussive therapy for cough  Zinc and vitamin C supplementation   Active Problems:    Hyponatremia  Mild hypovolemic hyponatremia secondary to poor oral intake Hydrating patient with intravenous isotonic fluids Monitoring sodium levels with serial chemistries    Lactic acidosis  Likely secondary to volume depletion in the setting of COVID-19 infection Hydrating patient with intravenous isotonic fluids Performing serial lactic acid levels to ensure downtrending and resolution.    Mixed  hyperlipidemia  Continue home regimen of Lipitor    GERD (gastroesophageal reflux disease)  Continuing home regimen of daily PPI therapy.    Coronary artery disease involving native coronary artery of native heart without angina pectoris  Patient is currently chest pain free Monitoring patient on telemetry Continue home regimen of antiplatelet therapy, lipid lowering therapy and AV nodal blocking therapy    Essential hypertension  Resume patients home regimen or oral antihypertensives PRN intravenous antihypertensives for excessively elevated blood pressure    Code Status:  Full code Family Communication: deferred   Status is: Observation  The patient remains OBS appropriate and  will d/c before 2 midnights.  Dispo: The patient is from: Home              Anticipated d/c is to: Home              Patient currently is not medically stable to d/c.   Difficult to place patient No        Vernelle Emerald MD [facility] Pager [phone]  If 7PM-7AM, please contact night-coverage [email or web address] Use universal  password for that web site. If you do not have the password, please call the hospital operator.  12/02/2020, 9:54 PM

## 2020-12-02 NOTE — Progress Notes (Signed)
Pharmacy Antibiotic Note  Colin Larson is a 86 y.o. male admitted on 12/02/2020 presenting with lethargy and chills, concern for sepsis.  Pharmacy has been consulted for vancomycin and cefepime dosing.  Plan: Vancomycin 1250 mg IV x 1, then 750 q 24h (eAUC 413, Goal 400-550, SCr 1.06) Cefepime 2g IV every 12 hours Monitor renal function, Cx and clinical progression to narrow Vancomycin levels as needed  Height: 5' 5"  (165.1 cm) Weight: 60.3 kg (133 lb) IBW/kg (Calculated) : 61.5  Temp (24hrs), Avg:99.4 F (37.4 C), Min:99.4 F (37.4 C), Max:99.4 F (37.4 C)  No results for input(s): WBC, CREATININE, LATICACIDVEN, VANCOTROUGH, VANCOPEAK, VANCORANDOM, GENTTROUGH, GENTPEAK, GENTRANDOM, TOBRATROUGH, TOBRAPEAK, TOBRARND, AMIKACINPEAK, AMIKACINTROU, AMIKACIN in the last 168 hours.  CrCl cannot be calculated (Patient's most recent lab result is older than the maximum 21 days allowed.).    Allergies  Allergen Reactions   Bee Venom Anaphylaxis and Swelling    Bertis Ruddy, PharmD Clinical Pharmacist ED Pharmacist Phone # [phone] 12/02/2020 2:50 PM

## 2020-12-02 NOTE — Progress Notes (Signed)
Elink monitoring for sepsis protocol 

## 2020-12-02 NOTE — ED Notes (Signed)
PATSY (WIFE) [phone], WOULD LIKE AN UPDATE ON PT.

## 2020-12-03 DIAGNOSIS — U071 COVID-19: Principal | ICD-10-CM

## 2020-12-03 DIAGNOSIS — E782 Mixed hyperlipidemia: Secondary | ICD-10-CM | POA: Diagnosis present

## 2020-12-03 DIAGNOSIS — Z955 Presence of coronary angioplasty implant and graft: Secondary | ICD-10-CM | POA: Diagnosis not present

## 2020-12-03 DIAGNOSIS — E871 Hypo-osmolality and hyponatremia: Secondary | ICD-10-CM | POA: Diagnosis present

## 2020-12-03 DIAGNOSIS — Z87891 Personal history of nicotine dependence: Secondary | ICD-10-CM | POA: Diagnosis not present

## 2020-12-03 DIAGNOSIS — I10 Essential (primary) hypertension: Secondary | ICD-10-CM | POA: Diagnosis present

## 2020-12-03 DIAGNOSIS — Z79899 Other long term (current) drug therapy: Secondary | ICD-10-CM | POA: Diagnosis not present

## 2020-12-03 DIAGNOSIS — I252 Old myocardial infarction: Secondary | ICD-10-CM | POA: Diagnosis not present

## 2020-12-03 DIAGNOSIS — Z85820 Personal history of malignant melanoma of skin: Secondary | ICD-10-CM | POA: Diagnosis not present

## 2020-12-03 DIAGNOSIS — E872 Acidosis: Secondary | ICD-10-CM | POA: Diagnosis present

## 2020-12-03 DIAGNOSIS — E861 Hypovolemia: Secondary | ICD-10-CM | POA: Diagnosis present

## 2020-12-03 DIAGNOSIS — E860 Dehydration: Secondary | ICD-10-CM | POA: Diagnosis present

## 2020-12-03 DIAGNOSIS — I2510 Atherosclerotic heart disease of native coronary artery without angina pectoris: Secondary | ICD-10-CM | POA: Diagnosis present

## 2020-12-03 DIAGNOSIS — K219 Gastro-esophageal reflux disease without esophagitis: Secondary | ICD-10-CM | POA: Diagnosis present

## 2020-12-03 DIAGNOSIS — Z7982 Long term (current) use of aspirin: Secondary | ICD-10-CM | POA: Diagnosis not present

## 2020-12-03 LAB — CBC WITH DIFFERENTIAL/PLATELET
Abs Immature Granulocytes: 0.01 10*3/uL (ref 0.00–0.07)
Basophils Absolute: 0 10*3/uL (ref 0.0–0.1)
Basophils Relative: 0 %
Eosinophils Absolute: 0 10*3/uL (ref 0.0–0.5)
Eosinophils Relative: 0 %
HCT: 31.2 % — ABNORMAL LOW (ref 39.0–52.0)
Hemoglobin: 10 g/dL — ABNORMAL LOW (ref 13.0–17.0)
Immature Granulocytes: 0 %
Lymphocytes Relative: 27 %
Lymphs Abs: 0.8 10*3/uL (ref 0.7–4.0)
MCH: 29.9 pg (ref 26.0–34.0)
MCHC: 32.1 g/dL (ref 30.0–36.0)
MCV: 93.4 fL (ref 80.0–100.0)
Monocytes Absolute: 0.4 10*3/uL (ref 0.1–1.0)
Monocytes Relative: 12 %
Neutro Abs: 1.9 10*3/uL (ref 1.7–7.7)
Neutrophils Relative %: 61 %
Platelets: 144 10*3/uL — ABNORMAL LOW (ref 150–400)
RBC: 3.34 MIL/uL — ABNORMAL LOW (ref 4.22–5.81)
RDW: 13.3 % (ref 11.5–15.5)
WBC: 3.1 10*3/uL — ABNORMAL LOW (ref 4.0–10.5)
nRBC: 0 % (ref 0.0–0.2)

## 2020-12-03 LAB — COMPREHENSIVE METABOLIC PANEL
ALT: 18 U/L (ref 0–44)
AST: 38 U/L (ref 15–41)
Albumin: 2.4 g/dL — ABNORMAL LOW (ref 3.5–5.0)
Alkaline Phosphatase: 48 U/L (ref 38–126)
Anion gap: 6 (ref 5–15)
BUN: 11 mg/dL (ref 8–23)
CO2: 23 mmol/L (ref 22–32)
Calcium: 8.2 mg/dL — ABNORMAL LOW (ref 8.9–10.3)
Chloride: 104 mmol/L (ref 98–111)
Creatinine, Ser: 0.85 mg/dL (ref 0.61–1.24)
GFR, Estimated: >60 mL/min (ref >60)
Glucose, Bld: 73 mg/dL (ref 70–99)
Potassium: 4.1 mmol/L (ref 3.5–5.1)
Sodium: 133 mmol/L — ABNORMAL LOW (ref 135–145)
Total Bilirubin: 0.7 mg/dL (ref 0.3–1.2)
Total Protein: 5 g/dL — ABNORMAL LOW (ref 6.5–8.1)

## 2020-12-03 LAB — PROCALCITONIN: Procalcitonin: <0.1 ng/mL

## 2020-12-03 LAB — MAGNESIUM: Magnesium: 1.2 mg/dL — ABNORMAL LOW (ref 1.7–2.4)

## 2020-12-03 LAB — C-REACTIVE PROTEIN: CRP: 2.7 mg/dL — ABNORMAL HIGH (ref <1.0)

## 2020-12-03 LAB — D-DIMER, QUANTITATIVE: D-Dimer, Quant: 1.25 ug/mL-FEU — ABNORMAL HIGH (ref 0.00–0.50)

## 2020-12-03 NOTE — ED Notes (Signed)
Pt was assisted with BP, he had a very small stool.

## 2020-12-03 NOTE — Progress Notes (Signed)
Pt arrived on unit. Aox4. HOH with hearing aid in R ear. Other belongings noted in chart. Call bell placed within reach. Pt able to verbalize understanding of call bell use. Will continue to monitor.

## 2020-12-03 NOTE — ED Provider Notes (Signed)
Care transferred to me.  Patient is found to be COVID-positive.  Unclear if this is causing all of his symptoms.  He had required multiple liters of fluids to get his blood pressure up.  Urinalysis delayed.  I think you will at least need observation admission given the hypotension.   Sherwood Gambler, MD 12/03/20 [phone]

## 2020-12-03 NOTE — ED Notes (Signed)
Breakfast orders placed 

## 2020-12-03 NOTE — Progress Notes (Signed)
PROGRESS NOTE                                                                                                                                                                                                             Patient Demographics:    Colin Larson, is a 86 y.o. male, DOB - [birth year], RKY:[id number]  Outpatient Primary MD for the patient is Mayra Neer, MD    LOS - 0  Admit date - 12/02/2020    Chief Complaint  Patient presents with   Code Sepsis       Brief Narrative (HPI from H&P) - 86 year old male with a past medical history of Crohn's disease, hypertension, mild aortic stenosis, coronary artery disease (MI 2000 S/P BMS to RCA), hyperlipidemia, hiatal hernia, history of GI bleeding (diverticular 2014) and history of melanoma who presents to [facility] emergency department with complaints of generalized weakness and lethargy, his work-up in the ER was consistent with severe dehydration, hyponatremia caused by COVID-19 infection.   Subjective:    Colin Larson today has, No headache, No chest pain, No abdominal pain - No Nausea, No new weakness tingling or numbness, no SOB - no cough.   Assessment  & Plan :     Acute COVID-19 infection in a patient who is fully vaccinated - his infection overall appears mild but definitely has caused dehydration requiring IV fluids which we will continue for another 24 hours, remdesivir has been started in the ER which we will continue for now, monitor inflammatory markers, advance activity, PT OT and monitor.   Encouraged the patient to sit up in chair in the daytime use I-S and flutter valve for pulmonary toiletry.  Will advance activity and titrate down oxygen as possible.  SpO2: 97 %  Recent Labs  Lab 12/02/20 1426 12/02/20 1432 12/02/20 1612 12/03/20 0500  WBC  --  4.5  --  3.1*  HGB  --  10.5*  --  10.0*  HCT  --  32.3*  --  31.2*  PLT  --  162   --  144*  CRP  --   --   --  2.7*  DDIMER  --   --   --  1.25*  AST  --  45*  --  38  ALT  --  19  --  18  ALKPHOS  --  51  --  48  BILITOT  --  0.6  --  0.7  ALBUMIN  --  2.7*  --  2.4*  INR  --  1.3*  --   --   LATICACIDVEN  --  2.4* 1.4  --   SARSCOV2NAA POSITIVE*  --   --   --      2.  Dehydration causing elevation of lactic acid.  Improved with hydration.  Continue IV fluids.  No sepsis.  3.  Dyslipidemia.  On statin.  4.  GERD - on PPI.  5.  CAD s/p stent placement in 2000.  Continue aspirin and statin for secondary prevention.  6.  History of Crohn's/UC .  Continue home medication Colazal all along with supportive care.   7.  H/O of aortic stenosis.  Supportive care.  Avoid large drop in preload.  8. HTN  -  on low dose ACE.    Obesity: Estimated body mass index is 22.13 kg/m as calculated from the following:   Height as of this encounter: 5' 5"  (1.651 m).   Weight as of this encounter: 60.3 kg.          Condition - Fair  Family Communication  :  wife Tessie Fass [phone] on 12/03/20  Code Status : Full  Consults  :  None  PUD Prophylaxis : PI   Procedures  :            Disposition Plan  :    Status is: Observation  Dispo: The patient is from: Home              Anticipated d/c is to: Home              Patient currently is not medically stable to d/c.   Difficult to place patient No  DVT Prophylaxis  :    enoxaparin (LOVENOX) injection 40 mg Start: 12/02/20 2200   Lab Results  Component Value Date   PLT 144 (L) 12/03/2020    Diet :  Diet Order             Diet Heart Room service appropriate? Yes; Fluid consistency: Thin  Diet effective now                    Inpatient Medications  Scheduled Meds:   vitamin C  500 mg Oral Daily   aspirin EC  81 mg Oral Daily   atorvastatin  80 mg Oral Daily   balsalazide  1,500 mg Oral BID   enoxaparin (LOVENOX) injection  40 mg Subcutaneous Q24H   pantoprazole  40 mg Oral Daily    ramipril  5 mg Oral Daily   zinc sulfate  220 mg Oral Daily   Continuous Infusions:  remdesivir 100 mg in NS 100 mL 100 mg (12/03/20 1014)   PRN Meds:.acetaminophen, albuterol, guaiFENesin-dextromethorphan, ondansetron **OR** ondansetron (ZOFRAN) IV, polyethylene glycol  Antibiotics  :    Anti-infectives (From admission, onward)    Start     Dose/Rate Route Frequency Ordered Stop   12/03/20 1700  vancomycin (VANCOREADY) IVPB 750 mg/150 mL  Status:  Discontinued        750 mg 150 mL/hr over 60 Minutes Intravenous Every 24 hours 12/02/20 1622 12/02/20 2153   12/03/20 1000  ceFEPIme (MAXIPIME) 2 g in sodium chloride 0.9 % 100 mL IVPB  Status:  Discontinued        2 g 200 mL/hr over 30 Minutes Intravenous Every 12  hours 12/02/20 1622 12/02/20 2153   12/03/20 1000  remdesivir 100 mg in sodium chloride 0.9 % 100 mL IVPB       See Hyperspace for full Linked Orders Report.   100 mg 200 mL/hr over 30 Minutes Intravenous Daily 12/02/20 2153 12/05/20 0959   12/02/20 2200  remdesivir 200 mg in sodium chloride 0.9% 250 mL IVPB       See Hyperspace for full Linked Orders Report.   200 mg 580 mL/hr over 30 Minutes Intravenous Once 12/02/20 2153 12/03/20 0205   12/02/20 1445  ceFEPIme (MAXIPIME) 2 g in sodium chloride 0.9 % 100 mL IVPB        2 g 200 mL/hr over 30 Minutes Intravenous  Once 12/02/20 1432 12/02/20 1536   12/02/20 1445  metroNIDAZOLE (FLAGYL) IVPB 500 mg        500 mg 100 mL/hr over 60 Minutes Intravenous  Once 12/02/20 1432 12/02/20 1557   12/02/20 1445  vancomycin (VANCOREADY) IVPB 1250 mg/250 mL        1,250 mg 166.7 mL/hr over 90 Minutes Intravenous  Once 12/02/20 1432 12/02/20 1632        Time Spent in minutes  30   Lala Lund M.D on 12/03/2020 at 11:19 AM  To page go to [email or web address]   [facility] -  Office  [phone]    See all Orders from today for further details    Objective:   Vitals:   12/03/20 0609 12/03/20 0610 12/03/20 0700 12/03/20  0836  BP: 132/89  107/72 125/62  Pulse: 99 84 (!) 50 67  Resp: 20   14  Temp:    98.2 F (36.8 C)  TempSrc:    Oral  SpO2: 97% 94% 95% 97%  Weight:      Height:        Wt Readings from Last 3 Encounters:  12/02/20 60.3 kg  08/09/20 60.6 kg  08/10/19 64.1 kg     Intake/Output Summary (Last 24 hours) at 12/03/2020 1119 Last data filed at 12/02/2020 2056 Gross per 24 hour  Intake 2445.27 ml  Output --  Net 2445.27 ml     Physical Exam  Awake Alert, No new F.N deficits, Normal affect [place].AT,PERRAL Supple Neck,No JVD, No cervical lymphadenopathy appriciated.  Symmetrical Chest wall movement, Good air movement bilaterally, CTAB RRR,No Gallops,Rubs or new Murmurs, No Parasternal Heave +ve B.Sounds, Abd Soft, No tenderness, No organomegaly appriciated, No rebound - guarding or rigidity. No Cyanosis, Clubbing or edema, No new Rash or bruise    RN pressure injury documentation:     Data Review:    CBC Recent Labs  Lab 12/02/20 1432 12/03/20 0500  WBC 4.5 3.1*  HGB 10.5* 10.0*  HCT 32.3* 31.2*  PLT 162 144*  MCV 93.1 93.4  MCH 30.3 29.9  MCHC 32.5 32.1  RDW 13.2 13.3  LYMPHSABS 0.4* 0.8  MONOABS 0.4 0.4  EOSABS 0.0 0.0  BASOSABS 0.0 0.0    Recent Labs  Lab 12/02/20 1432 12/02/20 1612 12/03/20 0500  NA 129*  --  133*  K 3.8  --  4.1  CL 99  --  104  CO2 23  --  23  GLUCOSE 126*  --  73  BUN 14  --  11  CREATININE 1.06  --  0.85  CALCIUM 8.3*  --  8.2*  AST 45*  --  38  ALT 19  --  18  ALKPHOS 51  --  48  BILITOT 0.6  --  0.7  ALBUMIN 2.7*  --  2.4*  MG  --   --  1.2*  CRP  --   --  2.7*  DDIMER  --   --  1.25*  LATICACIDVEN 2.4* 1.4  --   INR 1.3*  --   --     ------------------------------------------------------------------------------------------------------------------ No results for input(s): CHOL, HDL, LDLCALC, TRIG, CHOLHDL, LDLDIRECT in the last 72 hours.  No results found for:  HGBA1C ------------------------------------------------------------------------------------------------------------------ No results for input(s): TSH, T4TOTAL, T3FREE, THYROIDAB in the last 72 hours.  Invalid input(s): FREET3  Cardiac Enzymes No results for input(s): CKMB, TROPONINI, MYOGLOBIN in the last 168 hours.  Invalid input(s): CK ------------------------------------------------------------------------------------------------------------------ No results found for: BNP  Micro Results Recent Results (from the past 240 hour(s))  Resp Panel by RT-PCR (Flu A&B, Covid) Nasopharyngeal Swab     Status: Abnormal   Collection Time: 12/02/20  2:26 PM   Specimen: Nasopharyngeal Swab; Nasopharyngeal(NP) swabs in vial transport medium  Result Value Ref Range Status   SARS Coronavirus 2 by RT PCR POSITIVE (A) NEGATIVE Final    Comment: RESULT CALLED TO, READ BACK BY AND VERIFIED WITH: RN S.BETRAND ON [id number] AT 1723 BY E.PARRISH (NOTE) SARS-CoV-2 target nucleic acids are DETECTED.  The SARS-CoV-2 RNA is generally detectable in upper respiratory specimens during the acute phase of infection. Positive results are indicative of the presence of the identified virus, but do not rule out bacterial infection or co-infection with other pathogens not detected by the test. Clinical correlation with patient history and other diagnostic information is necessary to determine patient infection status. The expected result is Negative.  Fact Sheet for Patients: EntrepreneurPulse.com.au  Fact Sheet for Healthcare Providers: IncredibleEmployment.be  This test is not yet approved or cleared by the [place] FDA and  has been authorized for detection and/or diagnosis of SARS-CoV-2 by FDA under an Emergency Use Authorization (EUA).  This EUA will remain in effect (meaning this  test can be used) for the duration of  the COVID-19 declaration under Section  564(b)(1) of the Act, 21 U.S.C. section 360bbb-3(b)(1), unless the authorization is terminated or revoked sooner.     Influenza A by PCR NEGATIVE NEGATIVE Final   Influenza B by PCR NEGATIVE NEGATIVE Final    Comment: (NOTE) The Xpert Xpress SARS-CoV-2/FLU/RSV plus assay is intended as an aid in the diagnosis of influenza from Nasopharyngeal swab specimens and should not be used as a sole basis for treatment. Nasal washings and aspirates are unacceptable for Xpert Xpress SARS-CoV-2/FLU/RSV testing.  Fact Sheet for Patients: EntrepreneurPulse.com.au  Fact Sheet for Healthcare Providers: IncredibleEmployment.be  This test is not yet approved or cleared by the [place] FDA and has been authorized for detection and/or diagnosis of SARS-CoV-2 by FDA under an Emergency Use Authorization (EUA). This EUA will remain in effect (meaning this test can be used) for the duration of the COVID-19 declaration under Section 564(b)(1) of the Act, 21 U.S.C. section 360bbb-3(b)(1), unless the authorization is terminated or revoked.  Performed at [facility], [place] [address], [place], [place] [address]   Blood Culture (routine x 2)     Status: None (Preliminary result)   Collection Time: 12/02/20  2:50 PM   Specimen: BLOOD  Result Value Ref Range Status   Specimen Description BLOOD RIGHT ANTECUBITAL  Final   Special Requests   Final    BOTTLES DRAWN AEROBIC AND ANAEROBIC Blood Culture adequate volume   Culture   Final    NO GROWTH < 24  HOURS Performed at [facility], [place] [address] [place], [place] [address]    Report Status PENDING  Incomplete  Blood Culture (routine x 2)     Status: None (Preliminary result)   Collection Time: 12/02/20  2:50 PM   Specimen: BLOOD LEFT WRIST  Result Value Ref Range Status   Specimen Description BLOOD LEFT WRIST  Final   Special Requests   Final    BOTTLES DRAWN AEROBIC AND ANAEROBIC Blood Culture  adequate volume   Culture   Final    NO GROWTH < 24 HOURS Performed at [facility], [place] [address] [place], [place] [address]    Report Status PENDING  Incomplete    Radiology Reports DG Chest Port 1 View  Result Date: 12/02/2020 CLINICAL DATA:  Sepsis. EXAM: PORTABLE CHEST 1 VIEW COMPARISON:  Chest radiograph 11/30/2017. FINDINGS: Monitoring leads overlie the patient. Normal cardiac contours. No consolidative pulmonary opacities. No pleural effusion or pneumothorax. Thoracic spine degenerative changes. IMPRESSION: No acute cardiopulmonary process. Electronically Signed   By: Lovey Newcomer M.D.   On: 12/02/2020 15:22

## 2020-12-04 LAB — COMPREHENSIVE METABOLIC PANEL
ALT: 19 U/L (ref 0–44)
AST: 44 U/L — ABNORMAL HIGH (ref 15–41)
Albumin: 2.4 g/dL — ABNORMAL LOW (ref 3.5–5.0)
Alkaline Phosphatase: 48 U/L (ref 38–126)
Anion gap: 11 (ref 5–15)
BUN: 12 mg/dL (ref 8–23)
CO2: 22 mmol/L (ref 22–32)
Calcium: 9 mg/dL (ref 8.9–10.3)
Chloride: 98 mmol/L (ref 98–111)
Creatinine, Ser: 0.87 mg/dL (ref 0.61–1.24)
GFR, Estimated: >60 mL/min (ref >60)
Glucose, Bld: 81 mg/dL (ref 70–99)
Potassium: 4.3 mmol/L (ref 3.5–5.1)
Sodium: 131 mmol/L — ABNORMAL LOW (ref 135–145)
Total Bilirubin: 0.6 mg/dL (ref 0.3–1.2)
Total Protein: 5.2 g/dL — ABNORMAL LOW (ref 6.5–8.1)

## 2020-12-04 LAB — CBC WITH DIFFERENTIAL/PLATELET
Abs Immature Granulocytes: 0.01 10*3/uL (ref 0.00–0.07)
Basophils Absolute: 0 10*3/uL (ref 0.0–0.1)
Basophils Relative: 0 %
Eosinophils Absolute: 0 10*3/uL (ref 0.0–0.5)
Eosinophils Relative: 0 %
HCT: 32.8 % — ABNORMAL LOW (ref 39.0–52.0)
Hemoglobin: 10.9 g/dL — ABNORMAL LOW (ref 13.0–17.0)
Immature Granulocytes: 0 %
Lymphocytes Relative: 32 %
Lymphs Abs: 0.9 10*3/uL (ref 0.7–4.0)
MCH: 30 pg (ref 26.0–34.0)
MCHC: 33.2 g/dL (ref 30.0–36.0)
MCV: 90.4 fL (ref 80.0–100.0)
Monocytes Absolute: 0.3 10*3/uL (ref 0.1–1.0)
Monocytes Relative: 10 %
Neutro Abs: 1.5 10*3/uL — ABNORMAL LOW (ref 1.7–7.7)
Neutrophils Relative %: 58 %
Platelets: 144 10*3/uL — ABNORMAL LOW (ref 150–400)
RBC: 3.63 MIL/uL — ABNORMAL LOW (ref 4.22–5.81)
RDW: 13 % (ref 11.5–15.5)
WBC: 2.7 10*3/uL — ABNORMAL LOW (ref 4.0–10.5)
nRBC: 0 % (ref 0.0–0.2)

## 2020-12-04 LAB — URINE CULTURE: Culture: NO GROWTH

## 2020-12-04 LAB — C-REACTIVE PROTEIN: CRP: 3.3 mg/dL — ABNORMAL HIGH (ref <1.0)

## 2020-12-04 LAB — D-DIMER, QUANTITATIVE: D-Dimer, Quant: <0.27 ug/mL-FEU (ref 0.00–0.50)

## 2020-12-04 LAB — MAGNESIUM: Magnesium: 1.3 mg/dL — ABNORMAL LOW (ref 1.7–2.4)

## 2020-12-04 MED ORDER — LACTATED RINGERS IV SOLN: INTRAVENOUS | Status: AC

## 2020-12-04 MED ORDER — MAGNESIUM SULFATE IN D5W 1-5 GM/100ML-% IV SOLN
1.0000 g | Freq: Once | INTRAVENOUS | Status: AC
  Administered 2020-12-04: 1 g via INTRAVENOUS
  Filled 2020-12-04: qty 100

## 2020-12-04 MED ORDER — MAGNESIUM SULFATE 2 GM/50ML IV SOLN
2.0000 g | Freq: Once | INTRAVENOUS | Status: AC
  Administered 2020-12-04: 2 g via INTRAVENOUS
  Filled 2020-12-04: qty 50

## 2020-12-04 MED ORDER — LACTATED RINGERS IV SOLN: INTRAVENOUS | Status: DC

## 2020-12-04 NOTE — Progress Notes (Signed)
PROGRESS NOTE                                                                                                                                                                                                             Patient Demographics:    Colin Larson, is a 86 y.o. male, DOB - [birth year], OFH:[id number]  Outpatient Primary MD for the patient is Mayra Neer, MD    LOS - 1  Admit date - 12/02/2020    Chief Complaint  Patient presents with   Code Sepsis       Brief Narrative (HPI from H&P) - 86 year old male with a past medical history of Crohn's disease, hypertension, mild aortic stenosis, coronary artery disease (MI 2000 S/P BMS to RCA), hyperlipidemia, hiatal hernia, history of GI bleeding (diverticular 2014) and history of melanoma who presents to [facility] emergency department with complaints of generalized weakness and lethargy, his work-up in the ER was consistent with severe dehydration, hyponatremia caused by COVID-19 infection.   Subjective:   Patient in bed, appears comfortable, denies any headache, no fever, no chest pain or pressure, no shortness of breath , no abdominal pain. No new focal weakness.   Assessment  & Plan :     Acute COVID-19 infection in a patient who is fully vaccinated - his infection overall appears mild but definitely has caused dehydration requiring IV fluids which we will continue for another 24 hours, remdesivir has been started in the ER which we will continue for now, monitor inflammatory markers, advance activity, PT - OT and monitor.   Encouraged the patient to sit up in chair in the daytime use I-S and flutter valve for pulmonary toiletry.  Will advance activity and titrate down oxygen as possible.  SpO2: 97 %  Recent Labs  Lab 12/02/20 1426 12/02/20 1432 12/02/20 1612 12/03/20 0500 12/03/20 0746 12/04/20 0305  WBC  --  4.5  --  3.1*  --  2.7*  HGB  --   10.5*  --  10.0*  --  10.9*  HCT  --  32.3*  --  31.2*  --  32.8*  PLT  --  162  --  144*  --  144*  CRP  --   --   --  2.7*  --  3.3*  DDIMER  --   --   --  1.25*  --  <0.27  PROCALCITON  --   --   --   --  <0.10  --   AST  --  45*  --  38  --  44*  ALT  --  19  --  18  --  19  ALKPHOS  --  51  --  48  --  48  BILITOT  --  0.6  --  0.7  --  0.6  ALBUMIN  --  2.7*  --  2.4*  --  2.4*  INR  --  1.3*  --   --   --   --   LATICACIDVEN  --  2.4* 1.4  --   --   --   SARSCOV2NAA POSITIVE*  --   --   --   --   --     2.  Dehydration causing elevation of lactic acid.  Improved with hydration.  Continue IV fluids.  No sepsis.  3.  Dyslipidemia.  On statin.  4.  GERD - on PPI.  5.  CAD s/p stent placement in 2000.  Continue aspirin and statin for secondary prevention.  6.  History of Crohn's/UC .  Continue home medication Colazal all along with supportive care.   7.  H/O of aortic stenosis.  Supportive care.  Avoid large drop in preload.  8. HTN  -  on low dose ACE.  9. Hypomagnesemia.  Replaced.     Obesity: Estimated body mass index is 22.13 kg/m as calculated from the following:   Height as of this encounter: 5' 5"  (1.651 m).   Weight as of this encounter: 60.3 kg.          Condition - Fair  Family Communication  :  wife Tessie Fass [phone] on 12/03/20, 12/04/20  Code Status : Full  Consults  :  None  PUD Prophylaxis : PI   Procedures  :            Disposition Plan  :    Status is: Inp  Dispo: The patient is from: Home              Anticipated d/c is to: Home              Patient currently is not medically stable to d/c.   Difficult to place patient No  DVT Prophylaxis  :    enoxaparin (LOVENOX) injection 40 mg Start: 12/02/20 2200   Lab Results  Component Value Date   PLT 144 (L) 12/04/2020    Diet :  Diet Order             Diet Heart Room service appropriate? Yes; Fluid consistency: Thin  Diet effective now                     Inpatient Medications  Scheduled Meds:   vitamin C  500 mg Oral Daily   aspirin EC  81 mg Oral Daily   atorvastatin  80 mg Oral Daily   balsalazide  1,500 mg Oral BID   enoxaparin (LOVENOX) injection  40 mg Subcutaneous Q24H   pantoprazole  40 mg Oral Daily   ramipril  5 mg Oral Daily   zinc sulfate  220 mg Oral Daily   Continuous Infusions:  lactated ringers 75 mL/hr at 12/04/20 0741   magnesium sulfate bolus IVPB     PRN Meds:.acetaminophen, albuterol, guaiFENesin-dextromethorphan, ondansetron **OR** [DISCONTINUED] ondansetron (ZOFRAN) IV, polyethylene glycol  Antibiotics  :  Anti-infectives (From admission, onward)    Start     Dose/Rate Route Frequency Ordered Stop   12/03/20 1700  vancomycin (VANCOREADY) IVPB 750 mg/150 mL  Status:  Discontinued        750 mg 150 mL/hr over 60 Minutes Intravenous Every 24 hours 12/02/20 1622 12/02/20 2153   12/03/20 1000  ceFEPIme (MAXIPIME) 2 g in sodium chloride 0.9 % 100 mL IVPB  Status:  Discontinued        2 g 200 mL/hr over 30 Minutes Intravenous Every 12 hours 12/02/20 1622 12/02/20 2153   12/03/20 1000  remdesivir 100 mg in sodium chloride 0.9 % 100 mL IVPB       See Hyperspace for full Linked Orders Report.   100 mg 200 mL/hr over 30 Minutes Intravenous Daily 12/02/20 2153 12/04/20 0817   12/02/20 2200  remdesivir 200 mg in sodium chloride 0.9% 250 mL IVPB       See Hyperspace for full Linked Orders Report.   200 mg 580 mL/hr over 30 Minutes Intravenous Once 12/02/20 2153 12/03/20 0205   12/02/20 1445  ceFEPIme (MAXIPIME) 2 g in sodium chloride 0.9 % 100 mL IVPB        2 g 200 mL/hr over 30 Minutes Intravenous  Once 12/02/20 1432 12/02/20 1536   12/02/20 1445  metroNIDAZOLE (FLAGYL) IVPB 500 mg        500 mg 100 mL/hr over 60 Minutes Intravenous  Once 12/02/20 1432 12/02/20 1557   12/02/20 1445  vancomycin (VANCOREADY) IVPB 1250 mg/250 mL        1,250 mg 166.7 mL/hr over 90 Minutes Intravenous  Once 12/02/20 1432  12/02/20 1632        Time Spent in minutes  30   Lala Lund M.D on 12/04/2020 at 10:19 AM  To page go to [email or web address]   [facility] -  Office  [phone]    See all Orders from today for further details    Objective:   Vitals:   12/03/20 1943 12/03/20 2334 12/04/20 0336 12/04/20 0800  BP: (!) 112/51 (!) 112/54 138/61 139/68  Pulse: 84 60 76 (!) 58  Resp: 16 15 20 18   Temp: 98.3 F (36.8 C) 97.6 F (36.4 C) 98 F (36.7 C) 98.2 F (36.8 C)  TempSrc: Oral Oral Oral Oral  SpO2: 98% 94% 96% 97%  Weight:      Height:        Wt Readings from Last 3 Encounters:  12/02/20 60.3 kg  08/09/20 60.6 kg  08/10/19 64.1 kg     Intake/Output Summary (Last 24 hours) at 12/04/2020 1019 Last data filed at 12/03/2020 1840 Gross per 24 hour  Intake 1000 ml  Output --  Net 1000 ml     Physical Exam  Awake Alert, No new F.N deficits, Normal affect [place].AT,PERRAL Supple Neck,No JVD, No cervical lymphadenopathy appriciated.  Symmetrical Chest wall movement, Good air movement bilaterally, CTAB RRR,No Gallops, Rubs or new Murmurs, No Parasternal Heave +ve B.Sounds, Abd Soft, No tenderness, No organomegaly appriciated, No rebound - guarding or rigidity. No Cyanosis, Clubbing or edema, No new Rash or bruise   RN pressure injury documentation:     Data Review:    CBC Recent Labs  Lab 12/02/20 1432 12/03/20 0500 12/04/20 0305  WBC 4.5 3.1* 2.7*  HGB 10.5* 10.0* 10.9*  HCT 32.3* 31.2* 32.8*  PLT 162 144* 144*  MCV 93.1 93.4 90.4  MCH 30.3 29.9 30.0  MCHC 32.5 32.1 33.2  RDW 13.2  13.3 13.0  LYMPHSABS 0.4* 0.8 0.9  MONOABS 0.4 0.4 0.3  EOSABS 0.0 0.0 0.0  BASOSABS 0.0 0.0 0.0    Recent Labs  Lab 12/02/20 1432 12/02/20 1612 12/03/20 0500 12/03/20 0746 12/04/20 0305  NA 129*  --  133*  --  131*  K 3.8  --  4.1  --  4.3  CL 99  --  104  --  98  CO2 23  --  23  --  22  GLUCOSE 126*  --  73  --  81  BUN 14  --  11  --  12  CREATININE 1.06  --   0.85  --  0.87  CALCIUM 8.3*  --  8.2*  --  9.0  AST 45*  --  38  --  44*  ALT 19  --  18  --  19  ALKPHOS 51  --  48  --  48  BILITOT 0.6  --  0.7  --  0.6  ALBUMIN 2.7*  --  2.4*  --  2.4*  MG  --   --  1.2*  --  1.3*  CRP  --   --  2.7*  --  3.3*  DDIMER  --   --  1.25*  --  <0.27  PROCALCITON  --   --   --  <0.10  --   LATICACIDVEN 2.4* 1.4  --   --   --   INR 1.3*  --   --   --   --     ------------------------------------------------------------------------------------------------------------------ No results for input(s): CHOL, HDL, LDLCALC, TRIG, CHOLHDL, LDLDIRECT in the last 72 hours.  No results found for: HGBA1C ------------------------------------------------------------------------------------------------------------------ No results for input(s): TSH, T4TOTAL, T3FREE, THYROIDAB in the last 72 hours.  Invalid input(s): FREET3  Cardiac Enzymes No results for input(s): CKMB, TROPONINI, MYOGLOBIN in the last 168 hours.  Invalid input(s): CK ------------------------------------------------------------------------------------------------------------------ No results found for: BNP  Micro Results Recent Results (from the past 240 hour(s))  Resp Panel by RT-PCR (Flu A&B, Covid) Nasopharyngeal Swab     Status: Abnormal   Collection Time: 12/02/20  2:26 PM   Specimen: Nasopharyngeal Swab; Nasopharyngeal(NP) swabs in vial transport medium  Result Value Ref Range Status   SARS Coronavirus 2 by RT PCR POSITIVE (A) NEGATIVE Final    Comment: RESULT CALLED TO, READ BACK BY AND VERIFIED WITH: RN S.BETRAND ON [id number] AT 1723 BY E.PARRISH (NOTE) SARS-CoV-2 target nucleic acids are DETECTED.  The SARS-CoV-2 RNA is generally detectable in upper respiratory specimens during the acute phase of infection. Positive results are indicative of the presence of the identified virus, but do not rule out bacterial infection or co-infection with other pathogens not detected by the  test. Clinical correlation with patient history and other diagnostic information is necessary to determine patient infection status. The expected result is Negative.  Fact Sheet for Patients: EntrepreneurPulse.com.au  Fact Sheet for Healthcare Providers: IncredibleEmployment.be  This test is not yet approved or cleared by the [place] FDA and  has been authorized for detection and/or diagnosis of SARS-CoV-2 by FDA under an Emergency Use Authorization (EUA).  This EUA will remain in effect (meaning this  test can be used) for the duration of  the COVID-19 declaration under Section 564(b)(1) of the Act, 21 U.S.C. section 360bbb-3(b)(1), unless the authorization is terminated or revoked sooner.     Influenza A by PCR NEGATIVE NEGATIVE Final   Influenza B by PCR NEGATIVE NEGATIVE Final  Comment: (NOTE) The Xpert Xpress SARS-CoV-2/FLU/RSV plus assay is intended as an aid in the diagnosis of influenza from Nasopharyngeal swab specimens and should not be used as a sole basis for treatment. Nasal washings and aspirates are unacceptable for Xpert Xpress SARS-CoV-2/FLU/RSV testing.  Fact Sheet for Patients: EntrepreneurPulse.com.au  Fact Sheet for Healthcare Providers: IncredibleEmployment.be  This test is not yet approved or cleared by the [place] FDA and has been authorized for detection and/or diagnosis of SARS-CoV-2 by FDA under an Emergency Use Authorization (EUA). This EUA will remain in effect (meaning this test can be used) for the duration of the COVID-19 declaration under Section 564(b)(1) of the Act, 21 U.S.C. section 360bbb-3(b)(1), unless the authorization is terminated or revoked.  Performed at [facility], [place] [address] [place], [place] [address]   Blood Culture (routine x 2)     Status: None (Preliminary result)   Collection Time: 12/02/20  2:50 PM   Specimen: BLOOD   Result Value Ref Range Status   Specimen Description BLOOD RIGHT ANTECUBITAL  Final   Special Requests   Final    BOTTLES DRAWN AEROBIC AND ANAEROBIC Blood Culture adequate volume   Culture   Final    NO GROWTH 2 DAYS Performed at [facility], [place] [address], [place], [place] [address]    Report Status PENDING  Incomplete  Blood Culture (routine x 2)     Status: None (Preliminary result)   Collection Time: 12/02/20  2:50 PM   Specimen: BLOOD LEFT WRIST  Result Value Ref Range Status   Specimen Description BLOOD LEFT WRIST  Final   Special Requests   Final    BOTTLES DRAWN AEROBIC AND ANAEROBIC Blood Culture adequate volume   Culture   Final    NO GROWTH 2 DAYS Performed at [facility], [place] [address], [place], [place] [address]    Report Status PENDING  Incomplete  Urine culture     Status: None   Collection Time: 12/02/20 11:18 PM   Specimen: In/Out Cath Urine  Result Value Ref Range Status   Specimen Description IN/OUT CATH URINE  Final   Special Requests NONE  Final   Culture   Final    NO GROWTH Performed at [facility], [place] [address], [place], [place] [address]    Report Status 12/04/2020 FINAL  Final    Radiology Reports DG Chest Port 1 View  Result Date: 12/02/2020 CLINICAL DATA:  Sepsis. EXAM: PORTABLE CHEST 1 VIEW COMPARISON:  Chest radiograph 11/30/2017. FINDINGS: Monitoring leads overlie the patient. Normal cardiac contours. No consolidative pulmonary opacities. No pleural effusion or pneumothorax. Thoracic spine degenerative changes. IMPRESSION: No acute cardiopulmonary process. Electronically Signed   By: Lovey Newcomer M.D.   On: 12/02/2020 15:22

## 2020-12-04 NOTE — Evaluation (Signed)
Physical Therapy Evaluation Patient Details Name: Colin Larson MRN: [id number] DOB: 1935/04/28 Today's Date: 12/04/2020   History of Present Illness  86yo male admitted 12/02/20 c/o generalized weakness and lethargy, nausea. Found to be covid positive. PMH chrohn's disease, HTN, mild aortic stenosis, CAD, HLD, MI  Clinical Impression   Patient received in recliner, pleasant and cooperative but urgently needing to use rest room. Able to mobilize in room mostly on a min guard basis with IV pole but did have one incident of MinA to prevent posterior balance loss when turning in the bathroom. SpO2 as low as 86% on room air but able to recover well into 90s with seated rest. Left up in recliner with all needs met. Will benefit from [place] f/u at DC.     Follow Up Recommendations Home health PT    Equipment Recommendations  Rolling walker with 5" wheels    Recommendations for Other Services       Precautions / Restrictions Precautions Precautions: Fall Precaution Comments: Airborn/Contact, watch sats, HOH Restrictions Weight Bearing Restrictions: No      Mobility  Bed Mobility               General bed mobility comments: Up in recliner    Transfers Overall transfer level: Needs assistance Equipment used: None Transfers: Sit to/from Stand Sit to Stand: Supervision         General transfer comment: S for safety, assist for line management, no physical assist given  Ambulation/Gait Ambulation/Gait assistance: Min guard Gait Distance (Feet): 30 Feet (5fx2) Assistive device: IV Pole Gait Pattern/deviations: Trunk flexed;Step-through pattern;Drifts right/left Gait velocity: decreased   General Gait Details: mildly unsteady even with U UE support, did have one incident of posterior LOB requiring MinA to recover. SpO2 86% at lowest on RA but able to recover with seated rest  Stairs            Wheelchair Mobility    Modified Rankin (Stroke Patients Only)        Balance Overall balance assessment: Needs assistance Sitting-balance support: Feet supported;No upper extremity supported Sitting balance-Leahy Scale: Good     Standing balance support: Single extremity supported;During functional activity Standing balance-Leahy Scale: Fair Standing balance comment: mostly able to maintain dynamic balance at a min guard level, had one occasion of MinA                             Pertinent Vitals/Pain Pain Assessment: No/denies pain    Home Living                        Prior Function                 Hand Dominance        Extremity/Trunk Assessment   Upper Extremity Assessment Upper Extremity Assessment: Defer to OT evaluation    Lower Extremity Assessment Lower Extremity Assessment: Generalized weakness    Cervical / Trunk Assessment Cervical / Trunk Assessment: Kyphotic  Communication      Cognition Arousal/Alertness: Awake/alert Behavior During Therapy: WFL for tasks assessed/performed Overall Cognitive Status: Within Functional Limits for tasks assessed                                        General Comments      Exercises  Assessment/Plan    PT Assessment Patient needs continued PT services  PT Problem List Decreased strength;Decreased knowledge of use of DME;Decreased safety awareness;Decreased balance;Decreased mobility;Cardiopulmonary status limiting activity       PT Treatment Interventions DME instruction;Balance training;Gait training;Stair training;Functional mobility training;Patient/family education;Therapeutic activities;Therapeutic exercise    PT Goals (Current goals can be found in the Care Plan section)  Acute Rehab PT Goals Patient Stated Goal: To resume walking routine. PT Goal Formulation: With patient Time For Goal Achievement: 12/18/20 Potential to Achieve Goals: Good    Frequency Min 3X/week   Barriers to discharge        Co-evaluation                AM-PAC PT "6 Clicks" Mobility  Outcome Measure Help needed turning from your back to your side while in a flat bed without using bedrails?: None Help needed moving from lying on your back to sitting on the side of a flat bed without using bedrails?: None Help needed moving to and from a bed to a chair (including a wheelchair)?: None Help needed standing up from a chair using your arms (e.g., wheelchair or bedside chair)?: None Help needed to walk in hospital room?: A Little Help needed climbing 3-5 steps with a railing? : A Little 6 Click Score: 22    End of Session   Activity Tolerance: Patient tolerated treatment well Patient left: in chair;with call bell/phone within reach Nurse Communication: Mobility status PT Visit Diagnosis: Unsteadiness on feet (R26.81);Muscle weakness (generalized) (M62.81)    Time: 5391-2258 PT Time Calculation (min) (ACUTE ONLY): 18 min   Charges:   PT Evaluation $PT Eval Low Complexity: 1 Low         Windell Norfolk, DPT, PN1   Supplemental Physical Therapist [place]    Pager [phone] Acute Rehab Office [phone]

## 2020-12-04 NOTE — Evaluation (Signed)
Occupational Therapy Evaluation Patient Details Name: Colin Larson MRN: [id number] DOB: 1935-05-13 Today's Date: 12/04/2020    History of Present Illness 86 year old male with a past medical history of Crohn's disease, hypertension, mild aortic stenosis, coronary artery disease (MI 2000 S/P BMS to RCA), hyperlipidemia, hiatal hernia, history of GI bleeding (diverticular 2014) and history of melanoma who presents to [facility] emergency department with complaints of generalized weakness and lethargy, his work-up in the ER was consistent with severe dehydration, hyponatremia caused by COVID-19 infection.   Clinical Impression   Patient is currently requiring assistance with ADLs including min guard assist with toileting, minimal assist with LE dressing, and with bathing, and setup assist to supervision with grooming at sink and UE dressing, all of which is below patient's typical baseline of being Independent.  During this evaluation, patient was limited by decreased activity tolerance and body tremors which pt reports is from feeling cold but does affect pt's dynamic standing balance, which has the potential to impact patient's safety and independence during functional mobility, as well as performance for ADLs. [place] "6-clicks" Daily Activity Inpatient Short Form score of 19/24 this session. Patient lives with his spouse, who is able to provide 24/7 supervision and assistance.  Patient demonstrates good rehab potential, and should benefit from continued skilled occupational therapy services while in acute care to maximize safety, independence and quality of life at home.  ?    Follow Up Recommendations  No OT follow up    Equipment Recommendations       Recommendations for Other Services       Precautions / Restrictions Precautions Precautions: Fall Precaution Comments: Airborn/Contact Restrictions Weight Bearing Restrictions: No      Mobility Bed  Mobility               General bed mobility comments: Up in recliner    Transfers Overall transfer level: Needs assistance Equipment used: None Transfers: Sit to/from Stand;Stand Pivot Transfers Sit to Stand: Supervision Stand pivot transfers: Supervision            Balance Overall balance assessment: Mild deficits observed, not formally tested                                         ADL either performed or assessed with clinical judgement   ADL Overall ADL's : Needs assistance/impaired Eating/Feeding: Modified independent   Grooming: Standing;Supervision/safety;Set up   Upper Body Bathing: Set up;Supervision/ safety;Sitting   Lower Body Bathing: Sitting/lateral leans;Sit to/from stand;Min guard   Upper Body Dressing : Set up;Sitting   Lower Body Dressing: Minimal assistance;Cueing for compensatory techniques Lower Body Dressing Details (indicate cue type and reason): Pt bent at trunk to reach feet on floor with increased effort to doff RT sock. Pt ed on figure 4 technique and pt able to demonstrate back in modified form to don RT sock and doff LT sock. Pt with diffiuclty maintaining figure 4 on LT leg and needed Min As to complete donning LT sock. Toilet Transfer: Electronics engineer and Hygiene: Min Product manager Details (indicate cue type and reason): Min guard for clothing management. Pt able to perform peri hygiene with forward and lateral leans.     Functional mobility during ADLs: Supervision/safety;Min guard       Vision   Vision Assessment?: No apparent visual  deficits     Perception     Praxis      Pertinent Vitals/Pain Pain Assessment: No/denies pain     Hand Dominance Right   Extremity/Trunk Assessment Upper Extremity Assessment Upper Extremity Assessment: Overall WFL for tasks assessed   Lower Extremity Assessment Lower  Extremity Assessment: Defer to PT evaluation   Cervical / Trunk Assessment Cervical / Trunk Assessment: Normal   Communication Communication Communication: HOH   Cognition Arousal/Alertness: Awake/alert Behavior During Therapy: WFL for tasks assessed/performed Overall Cognitive Status: Within Functional Limits for tasks assessed                                     General Comments       Exercises     Shoulder Instructions      Home Living Family/patient expects to be discharged to:: Private residence Living Arrangements: Spouse/significant other Available Help at Discharge: Family;Available 24 hours/day Type of Home: House Home Access: Stairs to enter [employer] of Steps: 3 Entrance Stairs-Rails: Can reach both Home Layout: One level     Bathroom Shower/Tub: Tub/shower unit (Also has walk-in but pt u ses tub/shower)   Bathroom Toilet: Handicapped height         Additional Comments: Wife uses an "old walker" and a cane but pt reports he uses nothing and denies any other DME. Pt stated his wife may have a long reacher and long shower brush but unsure.  Pt is severely hard of hearing.      Prior Functioning/Environment Level of Independence: Independent        Comments: Pt reports independence with ADLs and pt and wife share IADLs. Pt denies falls in past year and reports ambulating independently. Pt does drive. Pt reports that he goes to a walking track at a nearby church and walks ~41mles a day M-F        OT Problem List: Cardiopulmonary status limiting activity;Decreased activity tolerance;Impaired balance (sitting and/or standing);Decreased knowledge of use of DME or AE      OT Treatment/Interventions: Self-care/ADL training;Therapeutic exercise;Therapeutic activities;Energy conservation;DME and/or AE instruction;Patient/family education;Balance training    OT Goals(Current goals can be found in the care plan section) Acute Rehab OT  Goals Patient Stated Goal: To resume walking routine. OT Goal Formulation: With patient Time For Goal Achievement: 12/18/20 Potential to Achieve Goals: Good ADL Goals Pt Will Perform Lower Body Bathing: with modified independence;with adaptive equipment;sitting/lateral leans;sit to/from stand Pt Will Perform Lower Body Dressing: with modified independence;with adaptive equipment;sitting/lateral leans;sit to/from stand Pt Will Transfer to Toilet: with modified independence;ambulating Pt Will Perform Tub/Shower Transfer: with supervision;Tub transfer Additional ADL Goal #1: Patient will identify at least 3 energy conservation strategies to employ at home in order to maximize function and quality of life and decrease caregiver burden while preventing exacerbation of symptoms and rehospitalization.  OT Frequency: Min 2X/week   Barriers to D/C:    No known       Co-evaluation              AM-PAC OT "6 Clicks" Daily Activity     Outcome Measure Help from another person eating meals?: None Help from another person taking care of personal grooming?: A Little Help from another person toileting, which includes using toliet, bedpan, or urinal?: A Little Help from another person bathing (including washing, rinsing, drying)?: A Little Help from another person to put on and taking off regular upper  body clothing?: A Little Help from another person to put on and taking off regular lower body clothing?: A Little 6 Click Score: 19   End of Session    Activity Tolerance: Patient tolerated treatment well Patient left: in chair;with call bell/phone within reach  OT Visit Diagnosis: Unsteadiness on feet (R26.81) (decreased ADLs Z73.6)                Time: 7460-0298 OT Time Calculation (min): 38 min Charges:  OT General Charges $OT Visit: 1 Visit OT Evaluation $OT Eval Low Complexity: 1 Low OT Treatments $Self Care/Home Management : 8-22 mins $Therapeutic Activity: 8-22 mins  Anderson Malta,  OT Acute Rehab Services Office: [phone] 12/04/2020  Julien Girt 12/04/2020, 10:54 AM

## 2020-12-05 LAB — URINALYSIS, ROUTINE W REFLEX MICROSCOPIC
Bilirubin Urine: NEGATIVE
Glucose, UA: NEGATIVE mg/dL
Hgb urine dipstick: NEGATIVE
Ketones, ur: NEGATIVE mg/dL
Leukocytes,Ua: NEGATIVE
Nitrite: NEGATIVE
Protein, ur: NEGATIVE mg/dL
Specific Gravity, Urine: 1.006 (ref 1.005–1.030)
pH: 6 (ref 5.0–8.0)

## 2020-12-05 LAB — CBC WITH DIFFERENTIAL/PLATELET
Abs Immature Granulocytes: 0.01 10*3/uL (ref 0.00–0.07)
Basophils Absolute: 0 10*3/uL (ref 0.0–0.1)
Basophils Relative: 0 %
Eosinophils Absolute: 0 10*3/uL (ref 0.0–0.5)
Eosinophils Relative: 0 %
HCT: 30.9 % — ABNORMAL LOW (ref 39.0–52.0)
Hemoglobin: 10.5 g/dL — ABNORMAL LOW (ref 13.0–17.0)
Immature Granulocytes: 0 %
Lymphocytes Relative: 30 %
Lymphs Abs: 0.7 10*3/uL (ref 0.7–4.0)
MCH: 29.9 pg (ref 26.0–34.0)
MCHC: 34 g/dL (ref 30.0–36.0)
MCV: 88 fL (ref 80.0–100.0)
Monocytes Absolute: 0.2 10*3/uL (ref 0.1–1.0)
Monocytes Relative: 10 %
Neutro Abs: 1.4 10*3/uL — ABNORMAL LOW (ref 1.7–7.7)
Neutrophils Relative %: 60 %
Platelets: 144 10*3/uL — ABNORMAL LOW (ref 150–400)
RBC: 3.51 MIL/uL — ABNORMAL LOW (ref 4.22–5.81)
RDW: 12.9 % (ref 11.5–15.5)
WBC: 2.3 10*3/uL — ABNORMAL LOW (ref 4.0–10.5)
nRBC: 0 % (ref 0.0–0.2)

## 2020-12-05 LAB — COMPREHENSIVE METABOLIC PANEL
ALT: 30 U/L (ref 0–44)
AST: 104 U/L — ABNORMAL HIGH (ref 15–41)
Albumin: 2.4 g/dL — ABNORMAL LOW (ref 3.5–5.0)
Alkaline Phosphatase: 47 U/L (ref 38–126)
Anion gap: 7 (ref 5–15)
BUN: 10 mg/dL (ref 8–23)
CO2: 25 mmol/L (ref 22–32)
Calcium: 8.5 mg/dL — ABNORMAL LOW (ref 8.9–10.3)
Chloride: 93 mmol/L — ABNORMAL LOW (ref 98–111)
Creatinine, Ser: 0.74 mg/dL (ref 0.61–1.24)
GFR, Estimated: >60 mL/min (ref >60)
Glucose, Bld: 80 mg/dL (ref 70–99)
Potassium: 3.5 mmol/L (ref 3.5–5.1)
Sodium: 125 mmol/L — ABNORMAL LOW (ref 135–145)
Total Bilirubin: 0.4 mg/dL (ref 0.3–1.2)
Total Protein: 5 g/dL — ABNORMAL LOW (ref 6.5–8.1)

## 2020-12-05 LAB — MAGNESIUM: Magnesium: 1.6 mg/dL — ABNORMAL LOW (ref 1.7–2.4)

## 2020-12-05 LAB — OSMOLALITY: Osmolality: 264 mOsm/kg — ABNORMAL LOW (ref 275–295)

## 2020-12-05 LAB — D-DIMER, QUANTITATIVE: D-Dimer, Quant: 0.84 ug/mL-FEU — ABNORMAL HIGH (ref 0.00–0.50)

## 2020-12-05 LAB — C-REACTIVE PROTEIN: CRP: 2 mg/dL — ABNORMAL HIGH (ref <1.0)

## 2020-12-05 LAB — URIC ACID: Uric Acid, Serum: 4.3 mg/dL (ref 3.7–8.6)

## 2020-12-05 LAB — OSMOLALITY, URINE: Osmolality, Ur: 327 mOsm/kg (ref 300–900)

## 2020-12-05 LAB — SODIUM, URINE, RANDOM: Sodium, Ur: 112 mmol/L

## 2020-12-05 MED ORDER — FUROSEMIDE 40 MG PO TABS: 40.0000 mg | ORAL_TABLET | Freq: Once | ORAL | Status: DC

## 2020-12-05 MED ORDER — FUROSEMIDE 40 MG PO TABS
40.0000 mg | ORAL_TABLET | Freq: Once | ORAL | Status: AC
  Administered 2020-12-05: 40 mg via ORAL
  Filled 2020-12-05: qty 1

## 2020-12-05 MED ORDER — MAGNESIUM SULFATE 2 GM/50ML IV SOLN
2.0000 g | Freq: Once | INTRAVENOUS | Status: AC
  Administered 2020-12-05: 2 g via INTRAVENOUS
  Filled 2020-12-05: qty 50

## 2020-12-05 NOTE — TOC Initial Note (Signed)
Transition of Care [facility]) - Initial/Assessment Note    Patient Details  Name: Colin Larson MRN: [id number] Date of Birth: 04/30/1935  [facility] CM/SW Contact:    Ninfa Meeker, RN Phone Number: 12/05/2020, 9:45 AM  Clinical Narrative:   Case manger spoke with patient's wife, [phone], discussed patient's discharge needs. She has no preference of Home Health agency, gave permission for referral to be called to [facility]. Will follow for oxygen needs, if any.  Expected Discharge Plan: [place] Barriers to Discharge: Continued Medical Work up   Patient Goals and CMS Choice     Choice offered to / list presented to : Spouse  Expected Discharge Plan and Services Expected Discharge Plan: [place]   Discharge Planning Services: CM Consult Post Acute Care Choice: Home Health, Durable Medical Equipment Living arrangements for the past 2 months: Single Family Home                 DME Arranged: Walker rolling DME Agency: [employer] Date DME Agency Contacted: 12/05/20 Time DME Agency Contacted: [id number] Representative spoke with at DME Agency: Freda Munro HH Arranged: PT [place] Agency: [place] Date [place]: 12/05/20 Time [place]: [phone] Representative spoke with at [place]: Adela Lank  Prior Living Arrangements/Services Living arrangements for the past 2 months: [place] with:: Spouse Patient language and need for interpreter reviewed:: Yes Do you feel safe going back to the place where you live?: Yes      Need for Family Participation in Patient Care: Yes (Comment) Care giver support system in place?: Yes (comment)   Criminal Activity/Legal Involvement Pertinent to Current Situation/Hospitalization: No - Comment as needed  Activities of Daily Living Home Assistive Devices/Equipment: None ADL Screening (condition at time of admission) Patient's  cognitive ability adequate to safely complete daily activities?: No Is the patient deaf or have difficulty hearing?: Yes (wears hearing aid) Does the patient have difficulty seeing, even when wearing glasses/contacts?: Yes (weras glasses; left at home) Does the patient have difficulty concentrating, remembering, or making decisions?: No Patient able to express need for assistance with ADLs?: Yes Does the patient have difficulty dressing or bathing?: No Independently performs ADLs?: Yes (appropriate for developmental age) Does the patient have difficulty walking or climbing stairs?: No Weakness of Legs: Both Weakness of Arms/Hands: None  Permission Sought/Granted Permission sought to share information with : Case Manager       Permission granted to share info w AGENCY: [facility]/ [employer]        Emotional Assessment       Orientation: : Oriented to Self, Oriented to Place, Oriented to  Time, Oriented to Situation Alcohol / Substance Use: Not Applicable Psych Involvement: No (comment)  Admission diagnosis:  COVID-19 virus infection [U07.1] Patient Active Problem List   Diagnosis Date Noted   COVID-19 virus infection 12/02/2020   Hyponatremia 12/02/2020   Lactic acidosis 12/02/2020   Bradycardia 12/01/2017   Dizziness 11/30/2017   Elevated LFTs 08/24/2017   Unilateral primary osteoarthritis, right knee 02/25/2017   Chronic pain of right knee 02/25/2017   Unilateral primary osteoarthritis, left knee 02/25/2017   History of anemia 07/30/2016   Carotid artery disease ([place])    GI bleeding    Mild aortic stenosis    Mild mitral regurgitation    Mixed hyperlipidemia 05/04/2013   GERD (gastroesophageal reflux disease) 05/04/2013   Diverticulosis of colon with hemorrhage 05/04/2013  Coronary artery disease involving native coronary artery of native heart without angina pectoris 05/04/2013   Crohn disease ([place]) 05/04/2013   Melanoma ([place]) 01/07/2012   Essential hypertension  12/29/2011   PCP:  Mayra Neer, MD Pharmacy:   [facility] [place] [address] Phone: [phone] Fax: [phone]  [id number] [id number]: [phone]: [phone]    Social Determinants of Health (SDOH) Interventions    Readmission Risk Interventions No flowsheet data found.

## 2020-12-05 NOTE — Progress Notes (Signed)
PROGRESS NOTE                                                                                                                                                                                                             Patient Demographics:    Colin Larson, is a 86 y.o. male, DOB - June 18, 1934, LTJ:[id number]  Outpatient Primary MD for the patient is Mayra Neer, MD    LOS - 2  Admit date - 12/02/2020    Chief Complaint  Patient presents with   Code Sepsis       Brief Narrative (HPI from H&P) - 86 year old male with a past medical history of Crohn's disease, hypertension, mild aortic stenosis, coronary artery disease (MI 2000 S/P BMS to RCA), hyperlipidemia, hiatal hernia, history of GI bleeding (diverticular 2014) and history of melanoma who presents to [facility] emergency department with complaints of generalized weakness and lethargy, his work-up in the ER was consistent with severe dehydration, hyponatremia caused by COVID-19 infection.   Subjective:   Patient in bed, appears comfortable, denies any headache, no fever, no chest pain or pressure, no shortness of breath , no abdominal pain. No new focal weakness.   Assessment  & Plan :     Acute COVID-19 infection in a patient who is fully vaccinated - his infection overall appears mild but definitely has caused dehydration requiring IV fluids which we will continue for another 24 hours, remdesivir has been started in the ER which we will continue for now, monitor inflammatory markers, advance activity, PT - OT and monitor.   Encouraged the patient to sit up in chair in the daytime use I-S and flutter valve for pulmonary toiletry.  Will advance activity and titrate down oxygen as possible.  SpO2: 100 %  Recent Labs  Lab 12/02/20 1426 12/02/20 1432 12/02/20 1612 12/03/20 0500 12/03/20 0746 12/04/20 0305 12/05/20 0138  WBC  --  4.5  --  3.1*  --   2.7* 2.3*  HGB  --  10.5*  --  10.0*  --  10.9* 10.5*  HCT  --  32.3*  --  31.2*  --  32.8* 30.9*  PLT  --  162  --  144*  --  144* 144*  CRP  --   --   --  2.7*  --  3.3* 2.0*  DDIMER  --   --   --  1.25*  --  <0.27 0.84*  PROCALCITON  --   --   --   --  <0.10  --   --   AST  --  45*  --  38  --  44* 104*  ALT  --  19  --  18  --  19 30  ALKPHOS  --  51  --  48  --  48 47  BILITOT  --  0.6  --  0.7  --  0.6 0.4  ALBUMIN  --  2.7*  --  2.4*  --  2.4* 2.4*  INR  --  1.3*  --   --   --   --   --   LATICACIDVEN  --  2.4* 1.4  --   --   --   --   SARSCOV2NAA POSITIVE*  --   --   --   --   --   --     2.  Dehydration causing elevation of lactic acid.  Improved with hydration.  DC IVF.  3.  Dyslipidemia.  On statin.  4.  GERD - on PPI.  5.  CAD s/p stent placement in 2000.  Continue aspirin and statin for secondary prevention.  6.  History of Crohn's/UC .  Continue home medication Colazal all along with supportive care.   7.  H/O of aortic stenosis.  Supportive care.  Avoid large drop in preload.  8. HTN  -  on low dose ACE.  9. Hypomagnesemia.  Replaced.    10. Hyponatremia - ? SIADH - check Osm, Urine electrolytes.  Obesity: Estimated body mass index is 22.13 kg/m as calculated from the following:   Height as of this encounter: 5' 5"  (1.651 m).   Weight as of this encounter: 60.3 kg.          Condition - Fair  Family Communication  :  wife Tessie Fass [phone] on 12/03/20, 12/04/20, 12/05/20  Code Status : Full  Consults  :  None  PUD Prophylaxis : PPI   Procedures  :            Disposition Plan  :    Status is: Inp  Dispo: The patient is from: Home              Anticipated d/c is to: Home              Patient currently is not medically stable to d/c.   Difficult to place patient No  DVT Prophylaxis  :    enoxaparin (LOVENOX) injection 40 mg Start: 12/02/20 2200   Lab Results  Component Value Date   PLT 144 (L) 12/05/2020    Diet :  Diet Order              Diet Heart Room service appropriate? Yes; Fluid consistency: Thin  Diet effective now                    Inpatient Medications  Scheduled Meds:   vitamin C  500 mg Oral Daily   aspirin EC  81 mg Oral Daily   atorvastatin  80 mg Oral Daily   balsalazide  1,500 mg Oral BID   enoxaparin (LOVENOX) injection  40 mg Subcutaneous Q24H   furosemide  40 mg Oral Once   pantoprazole  40 mg Oral Daily   ramipril  5 mg Oral Daily   zinc sulfate  220  mg Oral Daily   Continuous Infusions:  magnesium sulfate bolus IVPB 2 g (12/05/20 0836)   PRN Meds:.acetaminophen, albuterol, guaiFENesin-dextromethorphan, ondansetron **OR** [DISCONTINUED] ondansetron (ZOFRAN) IV, polyethylene glycol  Antibiotics  :    Anti-infectives (From admission, onward)    Start     Dose/Rate Route Frequency Ordered Stop   12/03/20 1700  vancomycin (VANCOREADY) IVPB 750 mg/150 mL  Status:  Discontinued        750 mg 150 mL/hr over 60 Minutes Intravenous Every 24 hours 12/02/20 1622 12/02/20 2153   12/03/20 1000  ceFEPIme (MAXIPIME) 2 g in sodium chloride 0.9 % 100 mL IVPB  Status:  Discontinued        2 g 200 mL/hr over 30 Minutes Intravenous Every 12 hours 12/02/20 1622 12/02/20 2153   12/03/20 1000  remdesivir 100 mg in sodium chloride 0.9 % 100 mL IVPB       See Hyperspace for full Linked Orders Report.   100 mg 200 mL/hr over 30 Minutes Intravenous Daily 12/02/20 2153 12/04/20 0817   12/02/20 2200  remdesivir 200 mg in sodium chloride 0.9% 250 mL IVPB       See Hyperspace for full Linked Orders Report.   200 mg 580 mL/hr over 30 Minutes Intravenous Once 12/02/20 2153 12/03/20 0205   12/02/20 1445  ceFEPIme (MAXIPIME) 2 g in sodium chloride 0.9 % 100 mL IVPB        2 g 200 mL/hr over 30 Minutes Intravenous  Once 12/02/20 1432 12/02/20 1536   12/02/20 1445  metroNIDAZOLE (FLAGYL) IVPB 500 mg        500 mg 100 mL/hr over 60 Minutes Intravenous  Once 12/02/20 1432 12/02/20 1557   12/02/20 1445   vancomycin (VANCOREADY) IVPB 1250 mg/250 mL        1,250 mg 166.7 mL/hr over 90 Minutes Intravenous  Once 12/02/20 1432 12/02/20 1632        Time Spent in minutes  30   Lala Lund M.D on 12/05/2020 at 9:09 AM  To page go to [email or web address]   [facility] -  Office  [phone]    See all Orders from today for further details    Objective:   Vitals:   12/04/20 1950 12/04/20 2343 12/05/20 0348 12/05/20 0728  BP: (!) 147/62 (!) 141/73 133/73 140/75  Pulse: 60 66 61   Resp: 20 20 20    Temp: 97.9 F (36.6 C) 98 F (36.7 C) 98.5 F (36.9 C) 98.1 F (36.7 C)  TempSrc: Oral Oral Oral Oral  SpO2: (!) 89% 94% 100%   Weight:      Height:        Wt Readings from Last 3 Encounters:  12/02/20 60.3 kg  08/09/20 60.6 kg  08/10/19 64.1 kg     Intake/Output Summary (Last 24 hours) at 12/05/2020 0909 Last data filed at 12/04/2020 1500 Gross per 24 hour  Intake 271.67 ml  Output --  Net 271.67 ml     Physical Exam  Awake Alert, No new F.N deficits, Normal affect [place].AT,PERRAL Supple Neck,No JVD, No cervical lymphadenopathy appriciated.  Symmetrical Chest wall movement, Good air movement bilaterally, CTAB RRR,No Gallops, Rubs or new Murmurs, No Parasternal Heave +ve B.Sounds, Abd Soft, No tenderness, No organomegaly appriciated, No rebound - guarding or rigidity. No Cyanosis, Clubbing or edema, No new Rash or bruise    RN pressure injury documentation:     Data Review:    CBC Recent Labs  Lab 12/02/20 1432 12/03/20 0500 12/04/20 0305  12/05/20 0138  WBC 4.5 3.1* 2.7* 2.3*  HGB 10.5* 10.0* 10.9* 10.5*  HCT 32.3* 31.2* 32.8* 30.9*  PLT 162 144* 144* 144*  MCV 93.1 93.4 90.4 88.0  MCH 30.3 29.9 30.0 29.9  MCHC 32.5 32.1 33.2 34.0  RDW 13.2 13.3 13.0 12.9  LYMPHSABS 0.4* 0.8 0.9 0.7  MONOABS 0.4 0.4 0.3 0.2  EOSABS 0.0 0.0 0.0 0.0  BASOSABS 0.0 0.0 0.0 0.0    Recent Labs  Lab 12/02/20 1432 12/02/20 1612 12/03/20 0500 12/03/20 0746  12/04/20 0305 12/05/20 0138  NA 129*  --  133*  --  131* 125*  K 3.8  --  4.1  --  4.3 3.5  CL 99  --  104  --  98 93*  CO2 23  --  23  --  22 25  GLUCOSE 126*  --  73  --  81 80  BUN 14  --  11  --  12 10  CREATININE 1.06  --  0.85  --  0.87 0.74  CALCIUM 8.3*  --  8.2*  --  9.0 8.5*  AST 45*  --  38  --  44* 104*  ALT 19  --  18  --  19 30  ALKPHOS 51  --  48  --  48 47  BILITOT 0.6  --  0.7  --  0.6 0.4  ALBUMIN 2.7*  --  2.4*  --  2.4* 2.4*  MG  --   --  1.2*  --  1.3* 1.6*  CRP  --   --  2.7*  --  3.3* 2.0*  DDIMER  --   --  1.25*  --  <0.27 0.84*  PROCALCITON  --   --   --  <0.10  --   --   LATICACIDVEN 2.4* 1.4  --   --   --   --   INR 1.3*  --   --   --   --   --     ------------------------------------------------------------------------------------------------------------------ No results for input(s): CHOL, HDL, LDLCALC, TRIG, CHOLHDL, LDLDIRECT in the last 72 hours.  No results found for: HGBA1C ------------------------------------------------------------------------------------------------------------------ No results for input(s): TSH, T4TOTAL, T3FREE, THYROIDAB in the last 72 hours.  Invalid input(s): FREET3  Cardiac Enzymes No results for input(s): CKMB, TROPONINI, MYOGLOBIN in the last 168 hours.  Invalid input(s): CK ------------------------------------------------------------------------------------------------------------------ No results found for: BNP  Micro Results Recent Results (from the past 240 hour(s))  Resp Panel by RT-PCR (Flu A&B, Covid) Nasopharyngeal Swab     Status: Abnormal   Collection Time: 12/02/20  2:26 PM   Specimen: Nasopharyngeal Swab; Nasopharyngeal(NP) swabs in vial transport medium  Result Value Ref Range Status   SARS Coronavirus 2 by RT PCR POSITIVE (A) NEGATIVE Final    Comment: RESULT CALLED TO, READ BACK BY AND VERIFIED WITH: RN S.BETRAND ON [id number] AT 1723 BY E.PARRISH (NOTE) SARS-CoV-2 target nucleic acids are  DETECTED.  The SARS-CoV-2 RNA is generally detectable in upper respiratory specimens during the acute phase of infection. Positive results are indicative of the presence of the identified virus, but do not rule out bacterial infection or co-infection with other pathogens not detected by the test. Clinical correlation with patient history and other diagnostic information is necessary to determine patient infection status. The expected result is Negative.  Fact Sheet for Patients: EntrepreneurPulse.com.au  Fact Sheet for Healthcare Providers: IncredibleEmployment.be  This test is not yet approved or cleared by the [place] FDA and  has  been authorized for detection and/or diagnosis of SARS-CoV-2 by FDA under an Emergency Use Authorization (EUA).  This EUA will remain in effect (meaning this  test can be used) for the duration of  the COVID-19 declaration under Section 564(b)(1) of the Act, 21 U.S.C. section 360bbb-3(b)(1), unless the authorization is terminated or revoked sooner.     Influenza A by PCR NEGATIVE NEGATIVE Final   Influenza B by PCR NEGATIVE NEGATIVE Final    Comment: (NOTE) The Xpert Xpress SARS-CoV-2/FLU/RSV plus assay is intended as an aid in the diagnosis of influenza from Nasopharyngeal swab specimens and should not be used as a sole basis for treatment. Nasal washings and aspirates are unacceptable for Xpert Xpress SARS-CoV-2/FLU/RSV testing.  Fact Sheet for Patients: EntrepreneurPulse.com.au  Fact Sheet for Healthcare Providers: IncredibleEmployment.be  This test is not yet approved or cleared by the [place] FDA and has been authorized for detection and/or diagnosis of SARS-CoV-2 by FDA under an Emergency Use Authorization (EUA). This EUA will remain in effect (meaning this test can be used) for the duration of the COVID-19 declaration under Section 564(b)(1) of the Act, 21  U.S.C. section 360bbb-3(b)(1), unless the authorization is terminated or revoked.  Performed at [facility], [place] [address], [place], [place] [address]   Blood Culture (routine x 2)     Status: None (Preliminary result)   Collection Time: 12/02/20  2:50 PM   Specimen: BLOOD  Result Value Ref Range Status   Specimen Description BLOOD RIGHT ANTECUBITAL  Final   Special Requests   Final    BOTTLES DRAWN AEROBIC AND ANAEROBIC Blood Culture adequate volume   Culture   Final    NO GROWTH 3 DAYS Performed at [facility], [place] [address], [place], [place] [address]    Report Status PENDING  Incomplete  Blood Culture (routine x 2)     Status: None (Preliminary result)   Collection Time: 12/02/20  2:50 PM   Specimen: BLOOD LEFT WRIST  Result Value Ref Range Status   Specimen Description BLOOD LEFT WRIST  Final   Special Requests   Final    BOTTLES DRAWN AEROBIC AND ANAEROBIC Blood Culture adequate volume   Culture   Final    NO GROWTH 3 DAYS Performed at [facility], [place] [address], [place], [place] [address]    Report Status PENDING  Incomplete  Urine culture     Status: None   Collection Time: 12/02/20 11:18 PM   Specimen: In/Out Cath Urine  Result Value Ref Range Status   Specimen Description IN/OUT CATH URINE  Final   Special Requests NONE  Final   Culture   Final    NO GROWTH Performed at [facility], [place] [address] [place], [place] [address]    Report Status 12/04/2020 FINAL  Final    Radiology Reports DG Chest Port 1 View  Result Date: 12/02/2020 CLINICAL DATA:  Sepsis. EXAM: PORTABLE CHEST 1 VIEW COMPARISON:  Chest radiograph 11/30/2017. FINDINGS: Monitoring leads overlie the patient. Normal cardiac contours. No consolidative pulmonary opacities. No pleural effusion or pneumothorax. Thoracic spine degenerative changes. IMPRESSION: No acute cardiopulmonary process. Electronically Signed   By: Lovey Newcomer M.D.   On: 12/02/2020 15:22

## 2020-12-05 NOTE — Progress Notes (Signed)
Physical Therapy Treatment Patient Details Name: Colin Larson MRN: [id number] DOB: 1934/09/09 Today's Date: 12/05/2020    History of Present Illness 86yo male admitted 12/02/20 c/o generalized weakness and lethargy, nausea. Found to be covid positive. PMH chrohn's disease, HTN, mild aortic stenosis, CAD, HLD, MI    PT Comments    Continuing work on functional mobility and activity tolerance;  Notable progress with activity tolerance; Pt requesting to use RW for amb, and used it well with in-room amb; walked on Room air and O2 sats remained on low to mid 90s (poor pleth wave when it dipped below 90%); I anticipate he will not need supplemental O2 at discharge  Follow Up Recommendations  Home health PT     Equipment Recommendations  Rolling walker with 5" wheels    Recommendations for Other Services       Precautions / Restrictions Precautions Precautions: Fall Precaution Comments: Airborn/Contact, watch sats, HOH    Mobility  Bed Mobility               General bed mobility comments: Up in recliner    Transfers Overall transfer level: Needs assistance Equipment used: None Transfers: Sit to/from Stand Sit to Stand: Supervision         General transfer comment: S for safety, assist for line management, no physical assist given  Ambulation/Gait Ambulation/Gait assistance: Min guard Gait Distance (Feet): 90 Feet (15x6) Assistive device: Rolling walker (2 wheeled) (Pt requested RW) Gait Pattern/deviations: Trunk flexed;Step-through pattern;Drifts right/left Gait velocity: decreased   General Gait Details: Mildly unsteady, but good use of RW for stability; Occasionally noting O2 sat drop below 90%, however with poor pleth wave; very likely ;t stayed at or above 90% with amb on room air   Stairs             Wheelchair Mobility    Modified Rankin (Stroke Patients Only)       Balance     Sitting balance-Leahy Scale: Good       Standing  balance-Leahy Scale: Fair Standing balance comment: mostly able to maintain dynamic balance at a min guard level, had one occasion of MinA                            Cognition Arousal/Alertness: Awake/alert Behavior During Therapy: WFL for tasks assessed/performed Overall Cognitive Status: Within Functional Limits for tasks assessed                                        Exercises      General Comments General comments (skin integrity, edema, etc.): Walked on room air, and while noted O2 sats would fall below 88%, it was with a poor pleth wave, and would rebound back to mid 90s without the need for supplemental O2      Pertinent Vitals/Pain Pain Assessment: No/denies pain    Home Living                      Prior Function            PT Goals (current goals can now be found in the care plan section) Acute Rehab PT Goals Patient Stated Goal: To resume walking routine. PT Goal Formulation: With patient Time For Goal Achievement: 12/18/20 Potential to Achieve Goals: Good Progress towards PT goals: Progressing toward goals  Frequency    Min 3X/week      PT Plan Current plan remains appropriate    Co-evaluation              AM-PAC PT "6 Clicks" Mobility   Outcome Measure  Help needed turning from your back to your side while in a flat bed without using bedrails?: None Help needed moving from lying on your back to sitting on the side of a flat bed without using bedrails?: None Help needed moving to and from a bed to a chair (including a wheelchair)?: None Help needed standing up from a chair using your arms (e.g., wheelchair or bedside chair)?: None Help needed to walk in hospital room?: A Little Help needed climbing 3-5 steps with a railing? : A Little 6 Click Score: 22    End of Session   Activity Tolerance: Patient tolerated treatment well Patient left: in chair;with call bell/phone within reach Nurse Communication:  Mobility status PT Visit Diagnosis: Unsteadiness on feet (R26.81);Muscle weakness (generalized) (M62.81)     Time: 1300-1316 PT Time Calculation (min) (ACUTE ONLY): 16 min  Charges:  $Gait Training: 8-22 mins                     Roney [place], [place]  Acute Rehabilitation Services Pager [phone] Office [phone]    [place]letta [place] 12/05/2020, 4:19 PM

## 2020-12-06 LAB — CBC WITH DIFFERENTIAL/PLATELET
Abs Immature Granulocytes: 0.01 10*3/uL (ref 0.00–0.07)
Basophils Absolute: 0 10*3/uL (ref 0.0–0.1)
Basophils Relative: 0 %
Eosinophils Absolute: 0 10*3/uL (ref 0.0–0.5)
Eosinophils Relative: 1 %
HCT: 33.8 % — ABNORMAL LOW (ref 39.0–52.0)
Hemoglobin: 11.6 g/dL — ABNORMAL LOW (ref 13.0–17.0)
Immature Granulocytes: 0 %
Lymphocytes Relative: 30 %
Lymphs Abs: 0.9 10*3/uL (ref 0.7–4.0)
MCH: 30.1 pg (ref 26.0–34.0)
MCHC: 34.3 g/dL (ref 30.0–36.0)
MCV: 87.6 fL (ref 80.0–100.0)
Monocytes Absolute: 0.3 10*3/uL (ref 0.1–1.0)
Monocytes Relative: 10 %
Neutro Abs: 1.7 10*3/uL (ref 1.7–7.7)
Neutrophils Relative %: 59 %
Platelets: 133 10*3/uL — ABNORMAL LOW (ref 150–400)
RBC: 3.86 MIL/uL — ABNORMAL LOW (ref 4.22–5.81)
RDW: 12.9 % (ref 11.5–15.5)
WBC: 2.9 10*3/uL — ABNORMAL LOW (ref 4.0–10.5)
nRBC: 0 % (ref 0.0–0.2)

## 2020-12-06 LAB — COMPREHENSIVE METABOLIC PANEL
ALT: 41 U/L (ref 0–44)
AST: 139 U/L — ABNORMAL HIGH (ref 15–41)
Albumin: 2.7 g/dL — ABNORMAL LOW (ref 3.5–5.0)
Alkaline Phosphatase: 61 U/L (ref 38–126)
Anion gap: 8 (ref 5–15)
BUN: 10 mg/dL (ref 8–23)
CO2: 25 mmol/L (ref 22–32)
Calcium: 8.5 mg/dL — ABNORMAL LOW (ref 8.9–10.3)
Chloride: 94 mmol/L — ABNORMAL LOW (ref 98–111)
Creatinine, Ser: 0.84 mg/dL (ref 0.61–1.24)
GFR, Estimated: >60 mL/min (ref >60)
Glucose, Bld: 81 mg/dL (ref 70–99)
Potassium: 3.5 mmol/L (ref 3.5–5.1)
Sodium: 127 mmol/L — ABNORMAL LOW (ref 135–145)
Total Bilirubin: 0.7 mg/dL (ref 0.3–1.2)
Total Protein: 5.4 g/dL — ABNORMAL LOW (ref 6.5–8.1)

## 2020-12-06 LAB — D-DIMER, QUANTITATIVE: D-Dimer, Quant: 1.01 ug/mL-FEU — ABNORMAL HIGH (ref 0.00–0.50)

## 2020-12-06 LAB — MAGNESIUM: Magnesium: 1.7 mg/dL (ref 1.7–2.4)

## 2020-12-06 LAB — C-REACTIVE PROTEIN: CRP: 1.7 mg/dL — ABNORMAL HIGH (ref <1.0)

## 2020-12-06 MED ORDER — SODIUM CHLORIDE 0.9 % IV SOLN: INTRAVENOUS | Status: DC

## 2020-12-06 NOTE — Plan of Care (Signed)
  Problem: Education: Goal: Knowledge of General Education information will improve Description: Including pain rating scale, medication(s)/side effects and non-pharmacologic comfort measures Outcome: Adequate for Discharge   Problem: Health Behavior/Discharge Planning: Goal: Ability to manage health-related needs will improve Outcome: Adequate for Discharge   Problem: Clinical Measurements: Goal: Ability to maintain clinical measurements within normal limits will improve Outcome: Adequate for Discharge Goal: Will remain free from infection Outcome: Adequate for Discharge Goal: Diagnostic test results will improve Outcome: Adequate for Discharge Goal: Respiratory complications will improve Outcome: Adequate for Discharge Goal: Cardiovascular complication will be avoided Outcome: Adequate for Discharge   Problem: Activity: Goal: Risk for activity intolerance will decrease Outcome: Adequate for Discharge   Problem: Nutrition: Goal: Adequate nutrition will be maintained Outcome: Adequate for Discharge   Problem: Safety: Goal: Ability to remain free from injury will improve Outcome: Adequate for Discharge   Problem: Education: Goal: Knowledge of risk factors and measures for prevention of condition will improve Outcome: Adequate for Discharge   Problem: Respiratory: Goal: Will maintain a patent airway Outcome: Adequate for Discharge Goal: Complications related to the disease process, condition or treatment will be avoided or minimized Outcome: Adequate for Discharge

## 2020-12-06 NOTE — Discharge Summary (Signed)
Colin Larson:[id number] DOB: 04-27-1935 DOA: 12/02/2020  PCP: Mayra Neer, MD  Admit date: 12/02/2020  Discharge date: 12/06/2020  Admitted From: Home Disposition:  Home   Recommendations for Outpatient Follow-up:   Follow up with PCP in 1-2 weeks  PCP Please obtain BMP/CBC, 2 view CXR in 1week,  (see Discharge instructions)   PCP Please follow up on the following pending results: Check CBC, CMP and a two-view chest x-ray in 7 to 10 days.   Home Health: PT,RN   Equipment/Devices: as below  Consultations: None  Discharge Condition: Stable    CODE STATUS: Full    Diet Recommendation: Heart Healthy   Diet Order             Diet - low sodium heart healthy           Diet Heart Room service appropriate? Yes; Fluid consistency: Thin; Fluid restriction: 1200 mL Fluid  Diet effective now                    Chief Complaint  Patient presents with   Code Sepsis     Brief history of present illness from the day of admission and additional interim summary    86 year old male with a past medical history of Crohn's disease, hypertension, mild aortic stenosis, coronary artery disease (MI 2000 S/P BMS to RCA), hyperlipidemia, hiatal hernia, history of GI bleeding (diverticular 2014) and history of melanoma who presents to [facility] emergency department with complaints of generalized weakness and lethargy, his work-up in the ER was consistent with severe dehydration, hyponatremia caused by COVID-19 infection.                                                                 Hospital Course   Acute COVID-19 infection in a patient who is fully vaccinated - his infection overall appears mild but definitely had caused dehydration requiring IV fluids with hydration and supportive care he is feeling  much better and close to his baseline, will be discharged home with close PCP follow-up.  Has qualified for home PT and RN which she will get     2.  Dehydration causing elevation of lactic acid.  Improved with hydration.  DC IVF.   3.  Dyslipidemia.  On statin.   4.  GERD - on PPI.   5.  CAD s/p stent placement in 2000.  Continue aspirin and statin for secondary prevention.   6.  History of Crohn's/UC .  Continue home medication Colazal all along with supportive care.   7.  H/O of aortic stenosis.  Supportive care.  Avoid large drop in preload.   8. HTN  -  on low dose ACE.   9. Hypomagnesemia.  Replaced.     10. Hyponatremia - hydrated, follow  with PCP in 7 to 10 days.  Hyponatremia improving   Obesity: Follow-up with PCP for weight loss. Estimated body mass index is 22.13 kg/m as calculated from the following:   Height as of this encounter: 5' 5"  (1.651 m).   Weight as of this encounter: 60.3 kg.   Discharge diagnosis     Principal Problem:   COVID-19 virus infection Active Problems:   Mixed hyperlipidemia   GERD (gastroesophageal reflux disease)   Coronary artery disease involving native coronary artery of native heart without angina pectoris   Essential hypertension   Hyponatremia   Lactic acidosis    Discharge instructions    Discharge Instructions     Diet - low sodium heart healthy   Complete by: As directed    Discharge instructions   Complete by: As directed    Follow with Primary MD Mayra Neer, MD in 7 days   Get CBC, CMP, 2 view Chest X ray -  checked next visit within 1 week by Primary MD    Activity: As tolerated with Full fall precautions use walker/cane & assistance as needed  Disposition Home    Diet: Heart Healthy   Special Instructions: If you have smoked or chewed Tobacco  in the last 2 yrs please stop smoking, stop any regular Alcohol  and or any Recreational drug use.  On your next visit with your primary care physician please  Get Medicines reviewed and adjusted.  Please request your Prim.MD to go over all Hospital Tests and Procedure/Radiological results at the follow up, please get all Hospital records sent to your Prim MD by signing hospital release before you go home.  If you experience worsening of your admission symptoms, develop shortness of breath, life threatening emergency, suicidal or homicidal thoughts you must seek medical attention immediately by calling 911 or calling your MD immediately  if symptoms less severe.  You Must read complete instructions/literature along with all the possible adverse reactions/side effects for all the Medicines you take and that have been prescribed to you. Take any new Medicines after you have completely understood and accpet all the possible adverse reactions/side effects.   Increase activity slowly   Complete by: As directed    MyChart COVID-19 home monitoring program   Complete by: Dec 06, 2020    Is the patient willing to use the [place] for home monitoring?: Yes   Temperature monitoring   Complete by: Dec 06, 2020    After how many days would you like to receive a notification of this patient's flowsheet entries?: 1       Discharge Medications   Allergies as of 12/06/2020       Reactions   Bee Venom Anaphylaxis, Swelling        Medication List     STOP taking these medications    meloxicam 15 MG tablet Commonly known as: MOBIC       TAKE these medications    acetaminophen 500 MG tablet Commonly known as: TYLENOL Take 1,000 mg by mouth every 6 (six) hours as needed for mild pain or moderate pain.   aspirin EC 81 MG tablet Take 81 mg by mouth daily.   atorvastatin 80 MG tablet Commonly known as: LIPITOR Take 80 mg by mouth daily.   balsalazide 750 MG capsule Commonly known as: COLAZAL Take 2 capsules by mouth 2 (two) times daily.   colestipol 1 g tablet Commonly known as: COLESTID Take 2 g by mouth daily.   donepezil  5 MG  tablet Commonly known as: ARICEPT Take 5 mg by mouth at bedtime.   ferrous sulfate 325 (65 FE) MG tablet Take 325 mg by mouth daily.   Magnesium 250 MG Tabs Take 250 mg by mouth daily.   multivitamin with minerals Tabs tablet Take 1 tablet by mouth daily.   NITROGLYCERIN ER PO Take 1 tablet by mouth once as needed (chest pain).   omeprazole 20 MG capsule Commonly known as: PRILOSEC Take 20 mg by mouth daily.   ramipril 5 MG capsule Commonly known as: ALTACE Take 5 mg by mouth daily.               Durable Medical Equipment  (From admission, onward)           Start     Ordered   12/05/20 1013  For home use only DME Walker rolling  Once       Question Answer Comment  Walker: With 5 Inch Wheels   Patient needs a walker to treat with the following condition Generalized weakness      12/05/20 1012             Follow-up Information     Mayra Neer, MD. Schedule an appointment as soon as possible for a visit in 1 week(s).   Specialty: Family Medicine Contact information: [address] [place] [address] [phone]         Jettie Booze, MD. Schedule an appointment as soon as possible for a visit in 1 week(s).   Specialties: Cardiology, Radiology, Interventional Cardiology Contact information: [address] [place] [address] [place] [address] [phone]                 Major procedures and Radiology Reports - PLEASE review detailed and final reports thoroughly  -        DG Chest Port 1 View  Result Date: 12/02/2020 CLINICAL DATA:  Sepsis. EXAM: PORTABLE CHEST 1 VIEW COMPARISON:  Chest radiograph 11/30/2017. FINDINGS: Monitoring leads overlie the patient. Normal cardiac contours. No consolidative pulmonary opacities. No pleural effusion or pneumothorax. Thoracic spine degenerative changes. IMPRESSION: No acute cardiopulmonary process. Electronically Signed   By: Lovey Newcomer M.D.   On: 12/02/2020 15:22     Micro Results     Recent Results (from the past 240 hour(s))  Resp Panel by RT-PCR (Flu A&B, Covid) Nasopharyngeal Swab     Status: Abnormal   Collection Time: 12/02/20  2:26 PM   Specimen: Nasopharyngeal Swab; Nasopharyngeal(NP) swabs in vial transport medium  Result Value Ref Range Status   SARS Coronavirus 2 by RT PCR POSITIVE (A) NEGATIVE Final    Comment: RESULT CALLED TO, READ BACK BY AND VERIFIED WITH: RN S.BETRAND ON [id number] AT 1723 BY E.PARRISH (NOTE) SARS-CoV-2 target nucleic acids are DETECTED.  The SARS-CoV-2 RNA is generally detectable in upper respiratory specimens during the acute phase of infection. Positive results are indicative of the presence of the identified virus, but do not rule out bacterial infection or co-infection with other pathogens not detected by the test. Clinical correlation with patient history and other diagnostic information is necessary to determine patient infection status. The expected result is Negative.  Fact Sheet for Patients: EntrepreneurPulse.com.au  Fact Sheet for Healthcare Providers: IncredibleEmployment.be  This test is not yet approved or cleared by the [place] FDA and  has been authorized for detection and/or diagnosis of SARS-CoV-2 by FDA under an Emergency Use Authorization (EUA).  This EUA will  remain in effect (meaning this  test can be used) for the duration of  the COVID-19 declaration under Section 564(b)(1) of the Act, 21 U.S.C. section 360bbb-3(b)(1), unless the authorization is terminated or revoked sooner.     Influenza A by PCR NEGATIVE NEGATIVE Final   Influenza B by PCR NEGATIVE NEGATIVE Final    Comment: (NOTE) The Xpert Xpress SARS-CoV-2/FLU/RSV plus assay is intended as an aid in the diagnosis of influenza from Nasopharyngeal swab specimens and should not be used as a sole basis for treatment. Nasal washings and aspirates are unacceptable for Xpert Xpress  SARS-CoV-2/FLU/RSV testing.  Fact Sheet for Patients: EntrepreneurPulse.com.au  Fact Sheet for Healthcare Providers: IncredibleEmployment.be  This test is not yet approved or cleared by the [place] FDA and has been authorized for detection and/or diagnosis of SARS-CoV-2 by FDA under an Emergency Use Authorization (EUA). This EUA will remain in effect (meaning this test can be used) for the duration of the COVID-19 declaration under Section 564(b)(1) of the Act, 21 U.S.C. section 360bbb-3(b)(1), unless the authorization is terminated or revoked.  Performed at [facility], [place] [address] [place], [place] [address]   Blood Culture (routine x 2)     Status: None (Preliminary result)   Collection Time: 12/02/20  2:50 PM   Specimen: BLOOD  Result Value Ref Range Status   Specimen Description BLOOD RIGHT ANTECUBITAL  Final   Special Requests   Final    BOTTLES DRAWN AEROBIC AND ANAEROBIC Blood Culture adequate volume   Culture   Final    NO GROWTH 4 DAYS Performed at [facility], [place] [address] [place], [place] [address]    Report Status PENDING  Incomplete  Blood Culture (routine x 2)     Status: None (Preliminary result)   Collection Time: 12/02/20  2:50 PM   Specimen: BLOOD LEFT WRIST  Result Value Ref Range Status   Specimen Description BLOOD LEFT WRIST  Final   Special Requests   Final    BOTTLES DRAWN AEROBIC AND ANAEROBIC Blood Culture adequate volume   Culture   Final    NO GROWTH 4 DAYS Performed at [facility], [place] [address] [place], [place] [address]    Report Status PENDING  Incomplete  Urine culture     Status: None   Collection Time: 12/02/20 11:18 PM   Specimen: In/Out Cath Urine  Result Value Ref Range Status   Specimen Description IN/OUT CATH URINE  Final   Special Requests NONE  Final   Culture   Final    NO GROWTH Performed at [facility], [place] [address] [place], [place]  [address]    Report Status 12/04/2020 FINAL  Final    Today   Subjective    [place] Schwinn today has no headache,no chest abdominal pain,no new weakness tingling or numbness, feels much better wants to go home today.   Objective   Blood pressure 133/75, pulse 71, temperature 97.8 F (36.6 C), temperature source Oral, resp. rate 18, height 5' 5"  (1.651 m), weight 60.3 kg, SpO2 91 %.   Intake/Output Summary (Last 24 hours) at 12/06/2020 1107 Last data filed at 12/06/2020 0009 Gross per 24 hour  Intake --  Output 720 ml  Net -720 ml    Exam  Awake Alert, No new F.N deficits, Normal affect [place].AT,PERRAL Supple Neck,No JVD, No cervical lymphadenopathy appriciated.  Symmetrical Chest wall movement, Good air movement bilaterally, CTAB RRR,No Gallops,Rubs or new Murmurs, No  Parasternal Heave +ve B.Sounds, Abd Soft, Non tender, No organomegaly appriciated, No rebound -guarding or rigidity. No Cyanosis, Clubbing or edema, No new Rash or bruise   Data Review   CBC w Diff:  Lab Results  Component Value Date   WBC 2.9 (L) 12/06/2020   HGB 11.6 (L) 12/06/2020   HCT 33.8 (L) 12/06/2020   PLT 133 (L) 12/06/2020   LYMPHOPCT 30 12/06/2020   MONOPCT 10 12/06/2020   EOSPCT 1 12/06/2020   BASOPCT 0 12/06/2020    CMP:  Lab Results  Component Value Date   NA 127 (L) 12/06/2020   NA 142 08/19/2019   K 3.5 12/06/2020   CL 94 (L) 12/06/2020   CO2 25 12/06/2020   BUN 10 12/06/2020   BUN 9 08/19/2019   CREATININE 0.84 12/06/2020   PROT 5.4 (L) 12/06/2020   PROT 6.1 08/10/2019   ALBUMIN 2.7 (L) 12/06/2020   ALBUMIN 3.4 (L) 08/10/2019   BILITOT 0.7 12/06/2020   BILITOT 0.3 08/10/2019   ALKPHOS 61 12/06/2020   AST 139 (H) 12/06/2020   ALT 41 12/06/2020  .   Total Time in preparing paper work, data evaluation and todays exam - 3 minutes  Lala Lund M.D on 12/06/2020 at 11:07 AM  [facility]

## 2020-12-06 NOTE — Discharge Instructions (Signed)
Follow with Primary MD Mayra Neer, MD in 7 days   Get CBC, CMP, 2 view Chest X ray -  checked next visit within 1 week by Primary MD    Activity: As tolerated with Full fall precautions use walker/cane & assistance as needed  Disposition Home    Diet: Heart Healthy   Special Instructions: If you have smoked or chewed Tobacco  in the last 2 yrs please stop smoking, stop any regular Alcohol  and or any Recreational drug use.  On your next visit with your primary care physician please Get Medicines reviewed and adjusted.  Please request your Prim.MD to go over all Hospital Tests and Procedure/Radiological results at the follow up, please get all Hospital records sent to your Prim MD by signing hospital release before you go home.  If you experience worsening of your admission symptoms, develop shortness of breath, life threatening emergency, suicidal or homicidal thoughts you must seek medical attention immediately by calling 911 or calling your MD immediately  if symptoms less severe.  You Must read complete instructions/literature along with all the possible adverse reactions/side effects for all the Medicines you take and that have been prescribed to you. Take any new Medicines after you have completely understood and accpet all the possible adverse reactions/side effects.

## 2020-12-07 LAB — CULTURE, BLOOD (ROUTINE X 2)
Culture: NO GROWTH
Culture: NO GROWTH
Special Requests: ADEQUATE
Special Requests: ADEQUATE

## 2020-12-08 DIAGNOSIS — K219 Gastro-esophageal reflux disease without esophagitis: Secondary | ICD-10-CM | POA: Diagnosis not present

## 2020-12-08 DIAGNOSIS — K449 Diaphragmatic hernia without obstruction or gangrene: Secondary | ICD-10-CM | POA: Diagnosis not present

## 2020-12-08 DIAGNOSIS — Z85820 Personal history of malignant melanoma of skin: Secondary | ICD-10-CM | POA: Diagnosis not present

## 2020-12-08 DIAGNOSIS — I350 Nonrheumatic aortic (valve) stenosis: Secondary | ICD-10-CM | POA: Diagnosis not present

## 2020-12-08 DIAGNOSIS — K5790 Diverticulosis of intestine, part unspecified, without perforation or abscess without bleeding: Secondary | ICD-10-CM | POA: Diagnosis not present

## 2020-12-08 DIAGNOSIS — H9190 Unspecified hearing loss, unspecified ear: Secondary | ICD-10-CM | POA: Diagnosis not present

## 2020-12-08 DIAGNOSIS — Z9181 History of falling: Secondary | ICD-10-CM | POA: Diagnosis not present

## 2020-12-08 DIAGNOSIS — E872 Acidosis: Secondary | ICD-10-CM | POA: Diagnosis not present

## 2020-12-08 DIAGNOSIS — U071 COVID-19: Secondary | ICD-10-CM | POA: Diagnosis not present

## 2020-12-08 DIAGNOSIS — I2510 Atherosclerotic heart disease of native coronary artery without angina pectoris: Secondary | ICD-10-CM | POA: Diagnosis not present

## 2020-12-08 DIAGNOSIS — M170 Bilateral primary osteoarthritis of knee: Secondary | ICD-10-CM | POA: Diagnosis not present

## 2020-12-08 DIAGNOSIS — Z955 Presence of coronary angioplasty implant and graft: Secondary | ICD-10-CM | POA: Diagnosis not present

## 2020-12-08 DIAGNOSIS — M19041 Primary osteoarthritis, right hand: Secondary | ICD-10-CM | POA: Diagnosis not present

## 2020-12-08 DIAGNOSIS — K5090 Crohn's disease, unspecified, without complications: Secondary | ICD-10-CM | POA: Diagnosis not present

## 2020-12-08 DIAGNOSIS — I10 Essential (primary) hypertension: Secondary | ICD-10-CM | POA: Diagnosis not present

## 2020-12-08 DIAGNOSIS — E782 Mixed hyperlipidemia: Secondary | ICD-10-CM | POA: Diagnosis not present

## 2020-12-08 DIAGNOSIS — I252 Old myocardial infarction: Secondary | ICD-10-CM | POA: Diagnosis not present

## 2020-12-08 DIAGNOSIS — Z7982 Long term (current) use of aspirin: Secondary | ICD-10-CM | POA: Diagnosis not present

## 2020-12-08 DIAGNOSIS — E860 Dehydration: Secondary | ICD-10-CM | POA: Diagnosis not present

## 2020-12-08 DIAGNOSIS — E871 Hypo-osmolality and hyponatremia: Secondary | ICD-10-CM | POA: Diagnosis not present

## 2020-12-08 DIAGNOSIS — M47814 Spondylosis without myelopathy or radiculopathy, thoracic region: Secondary | ICD-10-CM | POA: Diagnosis not present

## 2020-12-08 DIAGNOSIS — M19042 Primary osteoarthritis, left hand: Secondary | ICD-10-CM | POA: Diagnosis not present

## 2020-12-10 DIAGNOSIS — U071 COVID-19: Secondary | ICD-10-CM | POA: Diagnosis not present

## 2020-12-10 DIAGNOSIS — I10 Essential (primary) hypertension: Secondary | ICD-10-CM | POA: Diagnosis not present

## 2020-12-10 DIAGNOSIS — I2510 Atherosclerotic heart disease of native coronary artery without angina pectoris: Secondary | ICD-10-CM | POA: Diagnosis not present

## 2020-12-10 DIAGNOSIS — M19041 Primary osteoarthritis, right hand: Secondary | ICD-10-CM | POA: Diagnosis not present

## 2020-12-10 DIAGNOSIS — I252 Old myocardial infarction: Secondary | ICD-10-CM | POA: Diagnosis not present

## 2020-12-10 DIAGNOSIS — M19042 Primary osteoarthritis, left hand: Secondary | ICD-10-CM | POA: Diagnosis not present

## 2020-12-12 DIAGNOSIS — M19042 Primary osteoarthritis, left hand: Secondary | ICD-10-CM | POA: Diagnosis not present

## 2020-12-12 DIAGNOSIS — I10 Essential (primary) hypertension: Secondary | ICD-10-CM | POA: Diagnosis not present

## 2020-12-12 DIAGNOSIS — I252 Old myocardial infarction: Secondary | ICD-10-CM | POA: Diagnosis not present

## 2020-12-12 DIAGNOSIS — I2510 Atherosclerotic heart disease of native coronary artery without angina pectoris: Secondary | ICD-10-CM | POA: Diagnosis not present

## 2020-12-12 DIAGNOSIS — M19041 Primary osteoarthritis, right hand: Secondary | ICD-10-CM | POA: Diagnosis not present

## 2020-12-12 DIAGNOSIS — U071 COVID-19: Secondary | ICD-10-CM | POA: Diagnosis not present

## 2020-12-13 DIAGNOSIS — I2510 Atherosclerotic heart disease of native coronary artery without angina pectoris: Secondary | ICD-10-CM | POA: Diagnosis not present

## 2020-12-13 DIAGNOSIS — I252 Old myocardial infarction: Secondary | ICD-10-CM | POA: Diagnosis not present

## 2020-12-13 DIAGNOSIS — I10 Essential (primary) hypertension: Secondary | ICD-10-CM | POA: Diagnosis not present

## 2020-12-13 DIAGNOSIS — M19042 Primary osteoarthritis, left hand: Secondary | ICD-10-CM | POA: Diagnosis not present

## 2020-12-13 DIAGNOSIS — U071 COVID-19: Secondary | ICD-10-CM | POA: Diagnosis not present

## 2020-12-13 DIAGNOSIS — M19041 Primary osteoarthritis, right hand: Secondary | ICD-10-CM | POA: Diagnosis not present

## 2020-12-14 DIAGNOSIS — U071 COVID-19: Secondary | ICD-10-CM | POA: Diagnosis not present

## 2020-12-14 DIAGNOSIS — K5090 Crohn's disease, unspecified, without complications: Secondary | ICD-10-CM | POA: Diagnosis not present

## 2020-12-14 DIAGNOSIS — R748 Abnormal levels of other serum enzymes: Secondary | ICD-10-CM | POA: Diagnosis not present

## 2020-12-14 DIAGNOSIS — D61818 Other pancytopenia: Secondary | ICD-10-CM | POA: Diagnosis not present

## 2020-12-14 DIAGNOSIS — E871 Hypo-osmolality and hyponatremia: Secondary | ICD-10-CM | POA: Diagnosis not present

## 2020-12-18 DIAGNOSIS — U071 COVID-19: Secondary | ICD-10-CM | POA: Diagnosis not present

## 2020-12-18 DIAGNOSIS — I10 Essential (primary) hypertension: Secondary | ICD-10-CM | POA: Diagnosis not present

## 2020-12-18 DIAGNOSIS — M19042 Primary osteoarthritis, left hand: Secondary | ICD-10-CM | POA: Diagnosis not present

## 2020-12-18 DIAGNOSIS — M19041 Primary osteoarthritis, right hand: Secondary | ICD-10-CM | POA: Diagnosis not present

## 2020-12-18 DIAGNOSIS — I2510 Atherosclerotic heart disease of native coronary artery without angina pectoris: Secondary | ICD-10-CM | POA: Diagnosis not present

## 2020-12-18 DIAGNOSIS — I252 Old myocardial infarction: Secondary | ICD-10-CM | POA: Diagnosis not present

## 2020-12-19 DIAGNOSIS — I252 Old myocardial infarction: Secondary | ICD-10-CM | POA: Diagnosis not present

## 2020-12-19 DIAGNOSIS — I10 Essential (primary) hypertension: Secondary | ICD-10-CM | POA: Diagnosis not present

## 2020-12-19 DIAGNOSIS — I2510 Atherosclerotic heart disease of native coronary artery without angina pectoris: Secondary | ICD-10-CM | POA: Diagnosis not present

## 2020-12-19 DIAGNOSIS — U071 COVID-19: Secondary | ICD-10-CM | POA: Diagnosis not present

## 2020-12-19 DIAGNOSIS — M19042 Primary osteoarthritis, left hand: Secondary | ICD-10-CM | POA: Diagnosis not present

## 2020-12-19 DIAGNOSIS — M19041 Primary osteoarthritis, right hand: Secondary | ICD-10-CM | POA: Diagnosis not present

## 2020-12-20 DIAGNOSIS — Z85828 Personal history of other malignant neoplasm of skin: Secondary | ICD-10-CM | POA: Diagnosis not present

## 2020-12-20 DIAGNOSIS — L570 Actinic keratosis: Secondary | ICD-10-CM | POA: Diagnosis not present

## 2020-12-20 DIAGNOSIS — D485 Neoplasm of uncertain behavior of skin: Secondary | ICD-10-CM | POA: Diagnosis not present

## 2020-12-20 DIAGNOSIS — L821 Other seborrheic keratosis: Secondary | ICD-10-CM | POA: Diagnosis not present

## 2020-12-20 DIAGNOSIS — L905 Scar conditions and fibrosis of skin: Secondary | ICD-10-CM | POA: Diagnosis not present

## 2020-12-20 DIAGNOSIS — C44622 Squamous cell carcinoma of skin of right upper limb, including shoulder: Secondary | ICD-10-CM | POA: Diagnosis not present

## 2020-12-21 DIAGNOSIS — M19042 Primary osteoarthritis, left hand: Secondary | ICD-10-CM | POA: Diagnosis not present

## 2020-12-21 DIAGNOSIS — I10 Essential (primary) hypertension: Secondary | ICD-10-CM | POA: Diagnosis not present

## 2020-12-21 DIAGNOSIS — I2510 Atherosclerotic heart disease of native coronary artery without angina pectoris: Secondary | ICD-10-CM | POA: Diagnosis not present

## 2020-12-21 DIAGNOSIS — M19041 Primary osteoarthritis, right hand: Secondary | ICD-10-CM | POA: Diagnosis not present

## 2020-12-21 DIAGNOSIS — U071 COVID-19: Secondary | ICD-10-CM | POA: Diagnosis not present

## 2020-12-21 DIAGNOSIS — I252 Old myocardial infarction: Secondary | ICD-10-CM | POA: Diagnosis not present

## 2020-12-25 DIAGNOSIS — M19042 Primary osteoarthritis, left hand: Secondary | ICD-10-CM | POA: Diagnosis not present

## 2020-12-25 DIAGNOSIS — I252 Old myocardial infarction: Secondary | ICD-10-CM | POA: Diagnosis not present

## 2020-12-25 DIAGNOSIS — I10 Essential (primary) hypertension: Secondary | ICD-10-CM | POA: Diagnosis not present

## 2020-12-25 DIAGNOSIS — U071 COVID-19: Secondary | ICD-10-CM | POA: Diagnosis not present

## 2020-12-25 DIAGNOSIS — M19041 Primary osteoarthritis, right hand: Secondary | ICD-10-CM | POA: Diagnosis not present

## 2020-12-25 DIAGNOSIS — I2510 Atherosclerotic heart disease of native coronary artery without angina pectoris: Secondary | ICD-10-CM | POA: Diagnosis not present

## 2021-01-09 ENCOUNTER — Encounter: Payer: Self-pay | Admitting: Orthopaedic Surgery

## 2021-01-09 ENCOUNTER — Ambulatory Visit (INDEPENDENT_AMBULATORY_CARE_PROVIDER_SITE_OTHER): Payer: Medicare Other | Admitting: Orthopaedic Surgery

## 2021-01-09 ENCOUNTER — Ambulatory Visit (INDEPENDENT_AMBULATORY_CARE_PROVIDER_SITE_OTHER): Payer: Medicare Other

## 2021-01-09 DIAGNOSIS — M542 Cervicalgia: Secondary | ICD-10-CM | POA: Diagnosis not present

## 2021-01-09 DIAGNOSIS — I6523 Occlusion and stenosis of bilateral carotid arteries: Secondary | ICD-10-CM | POA: Diagnosis not present

## 2021-01-09 NOTE — Progress Notes (Signed)
Office Visit Note   Patient: Colin Larson           Date of Birth: 1935/05/19           MRN: [id number] Visit Date: 01/09/2021              Requested by: Mayra Neer, MD [address] [place],  [place] [address] PCP: Mayra Neer, MD   Assessment & Plan: Visit Diagnoses:  1. Neck pain     Plan: I have recommended daily to twice daily stretching exercises for his cervical spine.  He should also try alternating Tylenol arthritis and daily Voltaren gel.  There is really nothing else that I would recommend other than continuing home exercises.  All question concerns were answered and addressed.  Follow-up is as needed.  Follow-Up Instructions: Return if symptoms worsen or fail to improve.   Orders:  Orders Placed This Encounter  Procedures   XR Cervical Spine 2 or 3 views   No orders of the defined types were placed in this encounter.     Procedures: No procedures performed   Clinical Data: No additional findings.   Subjective: Chief Complaint  Patient presents with   Neck - Pain  The patient is a 86 year old gentleman who comes in for evaluation treatment of neck pain this been going on for about 10 months now.  It only hurts in his neck on both sides and into his lower skull on both sides.  There is no radicular component and no numbness and tingling in his hands.  His primary care physician appropriately sent him to physical therapy which he states did not help much.  His wife is with him and states that he did not do a lot of the exercises at home that he was told to do.  He still denies any radicular symptoms and is mainly pain in his neck with lateral rotation and bending that does not occur every day.  Sometimes it is bad and sometimes not at all according to him.  He has never had surgery on his neck.  HPI  Review of Systems There is currently listed no headache, chest pain, shortness of breath, fever, chills, nausea,  vomiting  Objective: Vital Signs: There were no vitals taken for this visit.  Physical Exam He is alert and oriented x3 and in no acute distress Ortho Exam Examination of the cervical spine shows full flexion and extension.  There are some stiffness with lateral rotation and bending but no pain along the midline and no significant pain today.  He has good strength in his bilateral upper extremities and no significant radicular symptoms. Specialty Comments:  No specialty comments available.  Imaging: XR Cervical Spine 2 or 3 views  Result Date: 01/09/2021 2 views of the cervical spine showed no acute findings.  There is degenerative changes from the mid to lower cervical spine at all levels.  This includes degenerative disc disease and facet arthritis.    PMFS History: Patient Active Problem List   Diagnosis Date Noted   COVID-19 virus infection 12/02/2020   Hyponatremia 12/02/2020   Lactic acidosis 12/02/2020   Bradycardia 12/01/2017   Dizziness 11/30/2017   Elevated LFTs 08/24/2017   Unilateral primary osteoarthritis, right knee 02/25/2017   Chronic pain of right knee 02/25/2017   Unilateral primary osteoarthritis, left knee 02/25/2017   History of anemia 07/30/2016   Carotid artery disease ([facility])    GI bleeding    Mild aortic stenosis  Mild mitral regurgitation    Mixed hyperlipidemia 05/04/2013   GERD (gastroesophageal reflux disease) 05/04/2013   Diverticulosis of colon with hemorrhage 05/04/2013   Coronary artery disease involving native coronary artery of native heart without angina pectoris 05/04/2013   Crohn disease ([facility]) 05/04/2013   Melanoma ([facility]) 01/07/2012   Essential hypertension 12/29/2011   Past Medical History:  Diagnosis Date   Anemia    Arthritis    Carotid artery disease ([facility])    Coronary artery disease    a. MI 2000 s/p BMS to prox/mid RCA, 70% lesion in abberant LCx that arises from right coronary cusp per Dr. Hassell Done note   Crohn disease  ([facility])    GI bleeding    2014 - from diverticulosis/Crohn's 2014   Hiatal hernia    no surgery   Hypercholesteremia    Hypertension    Melanoma ([facility]) 01/07/2012   MI (myocardial infarction) ([facility]) [id number]   Mild aortic stenosis    Mild mitral regurgitation    Skin cancer     Family History  Problem Relation Age of Onset   Cancer Mother    Stroke Father     Past Surgical History:  Procedure Laterality Date   CHOLECYSTECTOMY     CORONARY STENT PLACEMENT  [id number]   INGUINAL HERNIA REPAIR     x 2   Social History   Occupational History   Not on file  Tobacco Use   Smoking status: Former    Packs/day: 0.14    Years: 5.00    Pack years: 0.70    Types: Cigarettes    Quit date: 02/15/1975    Years since quitting: 45.9   Smokeless tobacco: Never  Substance and Sexual Activity   Alcohol use: No   Drug use: No   Sexual activity: Not on file

## 2021-01-16 ENCOUNTER — Ambulatory Visit: Payer: Medicare Other | Admitting: Physician Assistant

## 2021-01-18 DIAGNOSIS — D649 Anemia, unspecified: Secondary | ICD-10-CM | POA: Diagnosis not present

## 2021-01-18 DIAGNOSIS — E871 Hypo-osmolality and hyponatremia: Secondary | ICD-10-CM | POA: Diagnosis not present

## 2021-02-01 ENCOUNTER — Telehealth: Payer: Self-pay | Admitting: Neurology

## 2021-02-01 NOTE — Telephone Encounter (Signed)
Pharmacy is requesting refills for patients Nitroglycerin. At last visit medication was not listed on medication list. Please advise if okay to fill. Also , received refills for atorvastatin 80 mg QD and ramipril 5 mg QD from [facility] . Dr Irish Lack is not listed on prescriptions, are those refills okay to fill ?

## 2021-02-07 ENCOUNTER — Other Ambulatory Visit: Payer: Self-pay | Admitting: *Deleted

## 2021-02-07 MED ORDER — RAMIPRIL 5 MG PO CAPS: 5.0000 mg | ORAL_CAPSULE | Freq: Every day | ORAL | 1 refills | Status: DC

## 2021-02-07 MED ORDER — ATORVASTATIN CALCIUM 80 MG PO TABS: 80.0000 mg | ORAL_TABLET | Freq: Every day | ORAL | 1 refills | Status: DC

## 2021-02-07 MED ORDER — NITROGLYCERIN 0.4 MG SL SUBL: 0.4000 mg | SUBLINGUAL_TABLET | SUBLINGUAL | 1 refills | Status: DC | PRN

## 2021-03-20 ENCOUNTER — Other Ambulatory Visit: Payer: Self-pay

## 2021-03-20 ENCOUNTER — Ambulatory Visit: Payer: Self-pay

## 2021-03-20 ENCOUNTER — Encounter: Payer: Self-pay | Admitting: Physician Assistant

## 2021-03-20 ENCOUNTER — Ambulatory Visit (INDEPENDENT_AMBULATORY_CARE_PROVIDER_SITE_OTHER): Payer: Medicare Other | Admitting: Physician Assistant

## 2021-03-20 DIAGNOSIS — M1712 Unilateral primary osteoarthritis, left knee: Secondary | ICD-10-CM | POA: Diagnosis not present

## 2021-03-20 DIAGNOSIS — I6523 Occlusion and stenosis of bilateral carotid arteries: Secondary | ICD-10-CM

## 2021-03-20 DIAGNOSIS — M1711 Unilateral primary osteoarthritis, right knee: Secondary | ICD-10-CM | POA: Diagnosis not present

## 2021-03-20 MED ORDER — METHYLPREDNISOLONE ACETATE 40 MG/ML IJ SUSP
40.0000 mg | INTRAMUSCULAR | Status: AC | PRN
  Administered 2021-03-20: 40 mg via INTRA_ARTICULAR

## 2021-03-20 MED ORDER — LIDOCAINE HCL 1 % IJ SOLN
3.0000 mL | INTRAMUSCULAR | Status: AC | PRN
  Administered 2021-03-20: 3 mL

## 2021-03-20 NOTE — Progress Notes (Signed)
Office Visit Note   Patient: Colin Larson           Date of Birth: 09-24-1934           MRN: [id number] Visit Date: 03/20/2021              Requested by: Colin Neer, MD [address] [place],  [place] [address] PCP: Colin Neer, MD   Assessment & Plan: Visit Diagnoses:  1. Unilateral primary osteoarthritis, right knee   2. Unilateral primary osteoarthritis, left knee     Plan: Recommend he apply Voltaren gel on the knees up to 4 times daily.  Quad strengthening.  He understands to wait at least 3 months between injections.  Questions encouraged and answered at length  Follow-Up Instructions: No follow-ups on file.   Orders:  Orders Placed This Encounter  Procedures   Large Joint Inj   XR Knee 1-2 Views Left   XR Knee 1-2 Views Right   No orders of the defined types were placed in this encounter.     Procedures: Large Joint Inj: bilateral knee on 03/20/2021 2:40 PM Indications: pain Details: 22 G 1.5 in needle, anterolateral approach  Arthrogram: No  Medications (Right): 3 mL lidocaine 1 %; 40 mg methylPREDNISolone acetate 40 MG/ML Medications (Left): 3 mL lidocaine 1 %; 40 mg methylPREDNISolone acetate 40 MG/ML Outcome: tolerated well, no immediate complications Procedure, treatment alternatives, risks and benefits explained, specific risks discussed. Consent was given by the patient. Immediately prior to procedure a time out was called to verify the correct patient, procedure, equipment, support staff and site/side marked as required. Patient was prepped and draped in the usual sterile fashion.      Clinical Data: No additional findings.   Subjective: Chief Complaint  Patient presents with   Right Knee - Pain   Left Knee - Pain    HPI Colin Larson returns today for bilateral knee pain.  Asking for cortisone injections both knees.  Last injection was right knee 08/27/2020.  He is nondiabetic.  Denies any fevers or chills or ongoing  infections.  Denies injury to either knee.  Has pain in both knees when going up stairs.  Last radiographs of the right knee were 7 years ago which showed patellofemoral arthritic changes but otherwise no significant arthritic changes.  Review of Systems  Constitutional:  Negative for chills and fever.    Objective: Vital Signs: There were no vitals taken for this visit.  Physical Exam Constitutional:      Appearance: He is not ill-appearing or diaphoretic.  Pulmonary:     Effort: Pulmonary effort is normal.  Neurological:     Mental Status: He is alert.    Ortho Exam Bilateral knees: No abnormal warmth erythema or effusion.  No instability valgus varus stressing.  Significant patellofemoral crepitus bilateral knees with range of motion.  Otherwise excellent range of motion of both knees Specialty Comments:  No specialty comments available.  Imaging: XR Knee 1-2 Views Left  Result Date: 03/20/2021 Left knee 2 views: No acute fractures.  Knee is well located.  Significant patellofemoral arthritic changes.  Medial lateral joint line well-maintained.  Arthrosclerosis popliteal vessel..   XR Knee 1-2 Views Right  Result Date: 03/20/2021 Right knee 2 views: No acute fracture.  Knee is well located.  Medial lateral joint line well-maintained.  Severe patellofemoral arthritis.  Arthrosclerosis popliteal vessel noted.    PMFS History: Patient Active Problem List   Diagnosis Date Noted  COVID-19 virus infection 12/02/2020   Hyponatremia 12/02/2020   Lactic acidosis 12/02/2020   Bradycardia 12/01/2017   Dizziness 11/30/2017   Elevated LFTs 08/24/2017   Unilateral primary osteoarthritis, right knee 02/25/2017   Chronic pain of right knee 02/25/2017   Unilateral primary osteoarthritis, left knee 02/25/2017   History of anemia 07/30/2016   Carotid artery disease (HCC)    GI bleeding    Mild aortic stenosis    Mild mitral regurgitation    Mixed hyperlipidemia 05/04/2013   GERD  (gastroesophageal reflux disease) 05/04/2013   Diverticulosis of colon with hemorrhage 05/04/2013   Coronary artery disease involving native coronary artery of native heart without angina pectoris 05/04/2013   Crohn disease ([place]) 05/04/2013   Melanoma ([place]) 01/07/2012   Essential hypertension 12/29/2011   Past Medical History:  Diagnosis Date   Anemia    Arthritis    Carotid artery disease ([place])    Coronary artery disease    a. MI 2000 s/p BMS to prox/mid RCA, 70% lesion in abberant LCx that arises from right coronary cusp per Dr. Hassell Done note   Crohn disease ([place])    GI bleeding    2014 - from diverticulosis/Crohn's 2014   Hiatal hernia    no surgery   Hypercholesteremia    Hypertension    Melanoma ([place]) 01/07/2012   MI (myocardial infarction) ([place]) [id number]   Mild aortic stenosis    Mild mitral regurgitation    Skin cancer     Family History  Problem Relation Age of Onset   Cancer Mother    Stroke Father     Past Surgical History:  Procedure Laterality Date   CHOLECYSTECTOMY     CORONARY STENT PLACEMENT  [id number]   INGUINAL HERNIA REPAIR     x 2   Social History   Occupational History   Not on file  Tobacco Use   Smoking status: Former    Packs/day: 0.14    Years: 5.00    Pack years: 0.70    Types: Cigarettes    Quit date: 02/15/1975    Years since quitting: 46.1   Smokeless tobacco: Never  Substance and Sexual Activity   Alcohol use: No   Drug use: No   Sexual activity: Not on file

## 2021-03-29 DIAGNOSIS — I25119 Atherosclerotic heart disease of native coronary artery with unspecified angina pectoris: Secondary | ICD-10-CM | POA: Diagnosis not present

## 2021-03-29 DIAGNOSIS — R946 Abnormal results of thyroid function studies: Secondary | ICD-10-CM | POA: Diagnosis not present

## 2021-03-29 DIAGNOSIS — R7989 Other specified abnormal findings of blood chemistry: Secondary | ICD-10-CM | POA: Diagnosis not present

## 2021-03-29 DIAGNOSIS — E782 Mixed hyperlipidemia: Secondary | ICD-10-CM | POA: Diagnosis not present

## 2021-03-29 DIAGNOSIS — D61818 Other pancytopenia: Secondary | ICD-10-CM | POA: Diagnosis not present

## 2021-03-29 DIAGNOSIS — Z23 Encounter for immunization: Secondary | ICD-10-CM | POA: Diagnosis not present

## 2021-03-29 DIAGNOSIS — K5090 Crohn's disease, unspecified, without complications: Secondary | ICD-10-CM | POA: Diagnosis not present

## 2021-03-29 DIAGNOSIS — I119 Hypertensive heart disease without heart failure: Secondary | ICD-10-CM | POA: Diagnosis not present

## 2021-03-29 DIAGNOSIS — I700 Atherosclerosis of aorta: Secondary | ICD-10-CM | POA: Diagnosis not present

## 2021-06-21 DIAGNOSIS — K5000 Crohn's disease of small intestine without complications: Secondary | ICD-10-CM | POA: Diagnosis not present

## 2021-06-25 DIAGNOSIS — H10501 Unspecified blepharoconjunctivitis, right eye: Secondary | ICD-10-CM | POA: Diagnosis not present

## 2021-07-16 DIAGNOSIS — H2513 Age-related nuclear cataract, bilateral: Secondary | ICD-10-CM | POA: Diagnosis not present

## 2021-07-16 DIAGNOSIS — H524 Presbyopia: Secondary | ICD-10-CM | POA: Diagnosis not present

## 2021-07-16 DIAGNOSIS — H52203 Unspecified astigmatism, bilateral: Secondary | ICD-10-CM | POA: Diagnosis not present

## 2021-08-02 ENCOUNTER — Ambulatory Visit (HOSPITAL_COMMUNITY)
Admission: RE | Admit: 2021-08-02 | Discharge: 2021-08-02 | Disposition: A | Discharge status: Home / Self Care | Payer: Medicare Other | Source: Ambulatory Visit | Attending: Cardiovascular Disease | Admitting: Cardiovascular Disease

## 2021-08-02 ENCOUNTER — Other Ambulatory Visit: Payer: Self-pay

## 2021-08-02 DIAGNOSIS — I6523 Occlusion and stenosis of bilateral carotid arteries: Secondary | ICD-10-CM | POA: Diagnosis not present

## 2021-08-07 NOTE — Progress Notes (Signed)
Cardiology Office Note   Date:  08/08/2021   ID:  Colin Larson, DOB 12-Nov-1934, MRN [id number]  PCP:  Colin Neer, MD    No chief complaint on file.  CAD  Wt Readings from Last 3 Encounters:  08/08/21 133 lb (60.3 kg)  12/02/20 133 lb (60.3 kg)  08/09/20 133 lb 9.6 oz (60.6 kg)       History of Present Illness: Colin Larson is a 87 y.o. male   with history of CAD (MI 2000 s/p BMS to prox/mid RCA, 70% lesion in abberant LCx that arises from right coronary cusp), carotid disease, mild AS, HTN, HLD, arthritis, Crohn's disease, hiatal hernia, GIB (from diverticulosis/Crohn's 2014), melanoma of the skin who presents for yearly routine follow-up. Last echo 2014: mild LVH, EF 60-65%, mild AS, mild MR. Carotid duplex 07/2016: 40-98% RICA, 1-19% LICA, >14% RECA.   Elevated LFTs in the past when taking more tylenol for back pain.   He had an episode of dizziness, HTN which prompted visit to the ER.  Stroke ruled out.  Potassium and Magnesium were low.  MRI negative. HR noted to drop to 39 bpm.  Had been walking 2-3 miles/day in the past.  He continues to do this at present.   Hospitalized in 11/2020 showed: "Acute COVID-19 infection in a patient who is fully vaccinated - his infection overall appears mild but definitely had caused dehydration requiring IV fluids with hydration and supportive care he is feeling much better and close to his baseline, will be discharged home with close PCP follow-up.  Has qualified for home PT and RN which she will get."  Denies : Chest pain. Dizziness. Leg edema. Nitroglycerin use. Orthopnea. Palpitations. Paroxysmal nocturnal dyspnea. Shortness of breath. Syncope.     Past Medical History:  Diagnosis Date   Anemia    Arthritis    Carotid artery disease ([place])    Coronary artery disease    a. MI 2000 s/p BMS to prox/mid RCA, 70% lesion in abberant LCx that arises from right coronary cusp per Dr. Hassell Done note   Crohn disease [place])     GI bleeding    2014 - from diverticulosis/Crohn's 2014   Hiatal hernia    no surgery   Hypercholesteremia    Hypertension    Melanoma ([place]) 01/07/2012   MI (myocardial infarction) [facility]) [id number]   Mild aortic stenosis    Mild mitral regurgitation    Skin cancer     Past Surgical History:  Procedure Laterality Date   CHOLECYSTECTOMY     CORONARY STENT PLACEMENT  [id number]   INGUINAL HERNIA REPAIR     x 2     Current Outpatient Medications  Medication Sig Dispense Refill   acetaminophen (TYLENOL) 500 MG tablet Take 1,000 mg by mouth every 6 (six) hours as needed for mild pain or moderate pain.     aspirin EC 81 MG tablet Take 81 mg by mouth daily.     atorvastatin (LIPITOR) 80 MG tablet Take 1 tablet (80 mg total) by mouth daily. 90 tablet 1   balsalazide (COLAZAL) 750 MG capsule Take 2 capsules by mouth 2 (two) times daily.     donepezil (ARICEPT) 5 MG tablet Take 5 mg by mouth at bedtime.     Multiple Vitamin (MULTIVITAMIN WITH MINERALS) TABS tablet Take 1 tablet by mouth daily.     omeprazole (PRILOSEC) 20 MG capsule Take 20 mg by mouth daily.     ramipril (ALTACE) 5 MG capsule  Take 1 capsule (5 mg total) by mouth daily. 90 capsule 1   nitroGLYCERIN (NITROSTAT) 0.4 MG SL tablet Place 1 tablet (0.4 mg total) under the tongue every 5 (five) minutes as needed for chest pain. 25 tablet 1   Zinc 50 MG TABS Take 1 tablet by mouth daily.     No current facility-administered medications for this visit.    Allergies:   Bee venom    Social History:  The patient  reports that he quit smoking about 46 years ago. His smoking use included cigarettes. He has a 0.70 pack-year smoking history. He has never used smokeless tobacco. He reports that he does not drink alcohol and does not use drugs.   Family History:  The patient's family history includes Cancer in his mother; Stroke in his father.    ROS:  Please see the history of present illness.   Otherwise, review of systems are positive  for maintained weigh tloss; feet swelling- chronic, unchanged.   All other systems are reviewed and negative.    PHYSICAL EXAM: VS:  BP 100/60    Pulse 71    Ht 5' 7"  (1.702 m)    Wt 133 lb (60.3 kg)    SpO2 98%    BMI 20.83 kg/m  , BMI Body mass index is 20.83 kg/m. GEN: Well nourished, well developed, in no acute distress HEENT: normal Neck: no JVD, carotid bruits, or masses Cardiac: RRR; 2/6 early systolic murmurs, no rubs, or gallops,; mild bilateral ankle edema  Respiratory:  clear to auscultation bilaterally, normal work of breathing GI: soft, nontender, nondistended, + BS MS: no deformity or atrophy; left knee brace Skin: warm and dry, no rash Neuro:  Strength and sensation are intact Psych: euthymic mood, full affect   EKG:   The ekg ordered 11/2020 demonstrates NSR, RBBB   Recent Labs: 12/06/2020: ALT 41; BUN 10; Creatinine, Ser 0.84; Hemoglobin 11.6; Magnesium 1.7; Platelets 133; Potassium 3.5; Sodium 127   Lipid Panel No results found for: CHOL, TRIG, HDL, CHOLHDL, VLDL, LDLCALC, LDLDIRECT   Other studies Reviewed: Additional studies/ records that were reviewed today with results demonstrating: labs reviewed.   ASSESSMENT AND PLAN:  CAD: No angina on medical therapy.  Continue aspirin.  High dose atorvastatin.  LDL 62 in April 2022. Carotid artery disease: Moderate by Duplex in 07/2021.  Unchanged.  Mild AS: noted in 2019. No sx of severe AS.  Hypertensive heart disease: The current medical regimen is effective;  continue present plan and medications.  Normal renal function in April 2022.   Current medicines are reviewed at length with the patient today.  The patient concerns regarding his medicines were addressed.  The following changes have been made:  No change  Labs/ tests ordered today include:  No orders of the defined types were placed in this encounter.   Recommend 150 minutes/week of aerobic exercise Low fat, low carb, high fiber diet  recommended  Disposition:   FU in 1 year   Signed, Larae Grooms, MD  08/08/2021 3:24 PM    [facility] [place], [place], [place]  [address] Phone: [phone]; Fax: [phone]

## 2021-08-08 ENCOUNTER — Encounter: Payer: Self-pay | Admitting: Interventional Cardiology

## 2021-08-08 ENCOUNTER — Ambulatory Visit (INDEPENDENT_AMBULATORY_CARE_PROVIDER_SITE_OTHER): Payer: Medicare Other | Admitting: Interventional Cardiology

## 2021-08-08 ENCOUNTER — Other Ambulatory Visit: Payer: Self-pay

## 2021-08-08 VITALS — BP 100/60 | HR 71 | Ht 67.0 in | Wt 133.0 lb

## 2021-08-08 DIAGNOSIS — I119 Hypertensive heart disease without heart failure: Secondary | ICD-10-CM

## 2021-08-08 DIAGNOSIS — I6523 Occlusion and stenosis of bilateral carotid arteries: Secondary | ICD-10-CM

## 2021-08-08 DIAGNOSIS — I350 Nonrheumatic aortic (valve) stenosis: Secondary | ICD-10-CM | POA: Diagnosis not present

## 2021-08-08 DIAGNOSIS — I25118 Atherosclerotic heart disease of native coronary artery with other forms of angina pectoris: Secondary | ICD-10-CM

## 2021-08-08 NOTE — Patient Instructions (Signed)

## 2021-08-13 ENCOUNTER — Other Ambulatory Visit: Payer: Self-pay | Admitting: *Deleted

## 2021-08-13 DIAGNOSIS — I6523 Occlusion and stenosis of bilateral carotid arteries: Secondary | ICD-10-CM

## 2021-09-19 DIAGNOSIS — H9193 Unspecified hearing loss, bilateral: Secondary | ICD-10-CM | POA: Diagnosis not present

## 2021-10-07 DIAGNOSIS — K219 Gastro-esophageal reflux disease without esophagitis: Secondary | ICD-10-CM | POA: Diagnosis not present

## 2021-10-07 DIAGNOSIS — I25118 Atherosclerotic heart disease of native coronary artery with other forms of angina pectoris: Secondary | ICD-10-CM | POA: Diagnosis not present

## 2021-10-07 DIAGNOSIS — Z0000 Encounter for general adult medical examination without abnormal findings: Secondary | ICD-10-CM | POA: Diagnosis not present

## 2021-10-07 DIAGNOSIS — Z23 Encounter for immunization: Secondary | ICD-10-CM | POA: Diagnosis not present

## 2021-10-07 DIAGNOSIS — E039 Hypothyroidism, unspecified: Secondary | ICD-10-CM | POA: Diagnosis not present

## 2021-10-07 DIAGNOSIS — I119 Hypertensive heart disease without heart failure: Secondary | ICD-10-CM | POA: Diagnosis not present

## 2021-10-07 DIAGNOSIS — I700 Atherosclerosis of aorta: Secondary | ICD-10-CM | POA: Diagnosis not present

## 2021-10-07 DIAGNOSIS — Z85820 Personal history of malignant melanoma of skin: Secondary | ICD-10-CM | POA: Diagnosis not present

## 2021-10-07 DIAGNOSIS — E782 Mixed hyperlipidemia: Secondary | ICD-10-CM | POA: Diagnosis not present

## 2021-10-07 DIAGNOSIS — R413 Other amnesia: Secondary | ICD-10-CM | POA: Diagnosis not present

## 2021-10-07 DIAGNOSIS — K5090 Crohn's disease, unspecified, without complications: Secondary | ICD-10-CM | POA: Diagnosis not present

## 2021-10-17 DIAGNOSIS — L820 Inflamed seborrheic keratosis: Secondary | ICD-10-CM | POA: Diagnosis not present

## 2021-10-17 DIAGNOSIS — L298 Other pruritus: Secondary | ICD-10-CM | POA: Diagnosis not present

## 2021-10-17 DIAGNOSIS — L570 Actinic keratosis: Secondary | ICD-10-CM | POA: Diagnosis not present

## 2021-10-17 DIAGNOSIS — Z85828 Personal history of other malignant neoplasm of skin: Secondary | ICD-10-CM | POA: Diagnosis not present

## 2021-10-17 DIAGNOSIS — Z08 Encounter for follow-up examination after completed treatment for malignant neoplasm: Secondary | ICD-10-CM | POA: Diagnosis not present

## 2021-10-17 DIAGNOSIS — R208 Other disturbances of skin sensation: Secondary | ICD-10-CM | POA: Diagnosis not present

## 2021-10-17 DIAGNOSIS — Z789 Other specified health status: Secondary | ICD-10-CM | POA: Diagnosis not present

## 2021-10-17 DIAGNOSIS — L538 Other specified erythematous conditions: Secondary | ICD-10-CM | POA: Diagnosis not present

## 2021-10-17 DIAGNOSIS — Z85820 Personal history of malignant melanoma of skin: Secondary | ICD-10-CM | POA: Diagnosis not present

## 2021-11-22 DIAGNOSIS — K5000 Crohn's disease of small intestine without complications: Secondary | ICD-10-CM | POA: Diagnosis not present

## 2021-12-23 ENCOUNTER — Ambulatory Visit (INDEPENDENT_AMBULATORY_CARE_PROVIDER_SITE_OTHER): Payer: Medicare Other | Admitting: Physician Assistant

## 2021-12-23 ENCOUNTER — Encounter: Payer: Self-pay | Admitting: Physician Assistant

## 2021-12-23 DIAGNOSIS — M170 Bilateral primary osteoarthritis of knee: Secondary | ICD-10-CM

## 2021-12-23 DIAGNOSIS — M1712 Unilateral primary osteoarthritis, left knee: Secondary | ICD-10-CM | POA: Diagnosis not present

## 2021-12-23 DIAGNOSIS — M1711 Unilateral primary osteoarthritis, right knee: Secondary | ICD-10-CM | POA: Diagnosis not present

## 2021-12-23 DIAGNOSIS — I6523 Occlusion and stenosis of bilateral carotid arteries: Secondary | ICD-10-CM

## 2021-12-23 MED ORDER — LIDOCAINE HCL 1 % IJ SOLN
3.0000 mL | INTRAMUSCULAR | Status: AC | PRN
  Administered 2021-12-23: 3 mL

## 2021-12-23 MED ORDER — METHYLPREDNISOLONE ACETATE 40 MG/ML IJ SUSP
40.0000 mg | INTRAMUSCULAR | Status: AC | PRN
  Administered 2021-12-23: 40 mg via INTRA_ARTICULAR

## 2021-12-23 NOTE — Progress Notes (Signed)
   Procedure Note  Patient: Colin Larson             Date of Birth: 19-Feb-1935           MRN: [id number]             Visit Date: 12/23/2021 HPI: Colin Larson comes in today requesting bilateral cortisone injections for his knees.  He last was seen on 03/20/2021 and at that time was given bilateral cortisone injections.  He states he had no injuries to either knee.  Right knee is more painful than the left.  He states pain is worse after he walks for exercise.  He walks approximately 2-1/2 miles a day.  Notes that knee pains been ongoing for the past 4 months.  Prior to that the cortisone injections did well in managing his pain.  He mainly has severe patella femoral arthritis both knees.  Denies any change in his overall medical status.  He is nondiabetic.  Review of systems: Denies any infections, fevers, chills or injuries to either knee.  Physical exam: Bilateral knees good range of motion.  Patellofemoral crepitus bilateral knees no abnormal warmth erythema of either knee.  No instability valgus varus stressing of either knee. Procedures: Visit Diagnoses:  1. Unilateral primary osteoarthritis, right knee   2. Unilateral primary osteoarthritis, left knee     Large Joint Inj: bilateral knee on 12/23/2021 9:02 AM Indications: pain Details: 22 G 1.5 in needle, anterolateral approach  Arthrogram: No  Medications (Right): 3 mL lidocaine 1 %; 40 mg methylPREDNISolone acetate 40 MG/ML Medications (Left): 3 mL lidocaine 1 %; 40 mg methylPREDNISolone acetate 40 MG/ML Outcome: tolerated well, no immediate complications Procedure, treatment alternatives, risks and benefits explained, specific risks discussed. Consent was given by the patient. Immediately prior to procedure a time out was called to verify the correct patient, procedure, equipment, support staff and site/side marked as required. Patient was prepped and draped in the usual sterile fashion.    Plan: He understands to wait 3  months between cortisone injections.  These activities as tolerated.  Questions encouraged and answered at length.

## 2022-02-10 DIAGNOSIS — H6121 Impacted cerumen, right ear: Secondary | ICD-10-CM | POA: Diagnosis not present

## 2022-03-10 ENCOUNTER — Ambulatory Visit (INDEPENDENT_AMBULATORY_CARE_PROVIDER_SITE_OTHER): Payer: Medicare Other | Admitting: Physician Assistant

## 2022-03-10 ENCOUNTER — Ambulatory Visit (INDEPENDENT_AMBULATORY_CARE_PROVIDER_SITE_OTHER): Payer: Medicare Other

## 2022-03-10 ENCOUNTER — Encounter: Payer: Self-pay | Admitting: Physician Assistant

## 2022-03-10 DIAGNOSIS — M722 Plantar fascial fibromatosis: Secondary | ICD-10-CM

## 2022-03-10 DIAGNOSIS — I6523 Occlusion and stenosis of bilateral carotid arteries: Secondary | ICD-10-CM

## 2022-03-10 NOTE — Progress Notes (Signed)
Office Visit Note   Patient: Colin Larson           Date of Birth: [birth year]           MRN: [id number] Visit Date: 03/10/2022              Requested by: Mayra Neer, MD [address] [place],  [place] [address] PCP: Mayra Neer, MD   Assessment & Plan: Visit Diagnoses:  1. Plantar fasciitis of right foot     Plan: Offered formal physical therapy.  He would like exercises he can do at home therefore gastrocsoleus stretching exercises were discussed.  Also shoe wear was discussed with him.  He is to change his shoes out daily and avoid open back shoes.  Heel cups were given.  He will follow-up with [place] if his pain persists or becomes worse.  Recommend he use Voltaren gel plantar aspect of the foot 4 g up to 4 times daily he already has this at home.  Follow-Up Instructions: Return if symptoms worsen or fail to improve.   Orders:  Orders Placed This Encounter  Procedures   XR Foot Complete Right   XR Ankle Complete Right   No orders of the defined types were placed in this encounter.     Procedures: No procedures performed   Clinical Data: No additional findings.   Subjective: Chief Complaint  Patient presents with   Right Ankle - Pain   Right Foot - Pain    HPI Colin Larson comes in today with right foot and ankle pain.  States he has had pain for the last 2 to 3 years no known injury.  Pain is worse whenever he first begins to ambulate in the morning gets better throughout the day.  Pains on the bottom of the foot.  He has tried some arthritis creams without any real relief.  He ranks his pain to be 2-3 at 10 pain at worst.  Review of Systems See HPI otherwise negative  Objective: Vital Signs: There were no vitals taken for this visit.  Physical Exam Constitutional:      Appearance: He is normal weight. He is not ill-appearing or diaphoretic.  Pulmonary:     Effort: Pulmonary effort is normal.  Neurological:     Mental Status: He  is alert and oriented to person, place, and time.  Psychiatric:        Mood and Affect: Mood normal.     Ortho Exam Bilateral feet: Dorsal pedal pulses are 2+ and equal symmetric.  No impending rashes skin lesions ulcerations.  Multiple toe nails with what appears to be onychomycosis.  Left foot nontender throughout.  There are some midfoot dorsal bossing.  Right foot tenderness over the medial tubercle of the calcaneus.  The remainder of the foot is nontender.  Achilles is nontender.  Achilles is intact bilaterally.  He is able to do a single heel raise bilaterally.  No weakness with inversion eversion against resistance bilateral feet. Specialty Comments:  No specialty comments available.  Imaging: XR Foot Complete Right  Result Date: 03/10/2022 Right foot 3 views no acute fractures or acute findings.  No subluxations dislocations.  Midfoot arthritic changes.  Os trigonum in the plantar osteophyte are noted.  XR Ankle Complete Right  Result Date: 03/10/2022 Right ankle 3 views: Talus well located within the ankle mortise no diastases.  No acute fractures or acute findings.    PMFS History: Patient Active Problem List   Diagnosis  Date Noted   COVID-19 virus infection 12/02/2020   Hyponatremia 12/02/2020   Lactic acidosis 12/02/2020   Bradycardia 12/01/2017   Dizziness 11/30/2017   Elevated LFTs 08/24/2017   Unilateral primary osteoarthritis, right knee 02/25/2017   Chronic pain of right knee 02/25/2017   Unilateral primary osteoarthritis, left knee 02/25/2017   History of anemia 07/30/2016   Carotid artery disease (HCC)    GI bleeding    Mild aortic stenosis    Mild mitral regurgitation    Mixed hyperlipidemia 05/04/2013   GERD (gastroesophageal reflux disease) 05/04/2013   Diverticulosis of colon with hemorrhage 05/04/2013   Coronary artery disease involving native coronary artery of native heart without angina pectoris 05/04/2013   Crohn disease ([place]) 05/04/2013    Melanoma ([place]) 01/07/2012   Essential hypertension 12/29/2011   Past Medical History:  Diagnosis Date   Anemia    Arthritis    Carotid artery disease ([place])    Coronary artery disease    a. MI 2000 s/p BMS to prox/mid RCA, 70% lesion in abberant LCx that arises from right coronary cusp per Dr. Hassell Done note   Crohn disease ([place])    GI bleeding    2014 - from diverticulosis/Crohn's 2014   Hiatal hernia    no surgery   Hypercholesteremia    Hypertension    Melanoma ([place]) 01/07/2012   MI (myocardial infarction) ([place]) [id number]   Mild aortic stenosis    Mild mitral regurgitation    Skin cancer     Family History  Problem Relation Age of Onset   Cancer Mother    Stroke Father     Past Surgical History:  Procedure Laterality Date   CHOLECYSTECTOMY     CORONARY STENT PLACEMENT  [id number]   INGUINAL HERNIA REPAIR     x 2   Social History   Occupational History   Not on file  Tobacco Use   Smoking status: Former    Packs/day: 0.14    Years: 5.00    Total pack years: 0.70    Types: Cigarettes    Quit date: 02/15/1975    Years since quitting: 47.0   Smokeless tobacco: Never  Substance and Sexual Activity   Alcohol use: No   Drug use: No   Sexual activity: Not on file

## 2022-04-03 DIAGNOSIS — Z974 Presence of external hearing-aid: Secondary | ICD-10-CM | POA: Diagnosis not present

## 2022-04-03 DIAGNOSIS — H6123 Impacted cerumen, bilateral: Secondary | ICD-10-CM | POA: Diagnosis not present

## 2022-04-09 DIAGNOSIS — I119 Hypertensive heart disease without heart failure: Secondary | ICD-10-CM | POA: Diagnosis not present

## 2022-04-09 DIAGNOSIS — R413 Other amnesia: Secondary | ICD-10-CM | POA: Diagnosis not present

## 2022-04-09 DIAGNOSIS — E782 Mixed hyperlipidemia: Secondary | ICD-10-CM | POA: Diagnosis not present

## 2022-04-09 DIAGNOSIS — Z79899 Other long term (current) drug therapy: Secondary | ICD-10-CM | POA: Diagnosis not present

## 2022-04-09 DIAGNOSIS — I25118 Atherosclerotic heart disease of native coronary artery with other forms of angina pectoris: Secondary | ICD-10-CM | POA: Diagnosis not present

## 2022-04-09 DIAGNOSIS — E039 Hypothyroidism, unspecified: Secondary | ICD-10-CM | POA: Diagnosis not present

## 2022-04-09 DIAGNOSIS — Z23 Encounter for immunization: Secondary | ICD-10-CM | POA: Diagnosis not present

## 2022-04-10 ENCOUNTER — Other Ambulatory Visit: Payer: Self-pay | Admitting: Family Medicine

## 2022-04-10 DIAGNOSIS — Z79899 Other long term (current) drug therapy: Secondary | ICD-10-CM

## 2022-05-14 DIAGNOSIS — L570 Actinic keratosis: Secondary | ICD-10-CM | POA: Diagnosis not present

## 2022-05-14 DIAGNOSIS — L814 Other melanin hyperpigmentation: Secondary | ICD-10-CM | POA: Diagnosis not present

## 2022-05-14 DIAGNOSIS — D485 Neoplasm of uncertain behavior of skin: Secondary | ICD-10-CM | POA: Diagnosis not present

## 2022-05-14 DIAGNOSIS — L578 Other skin changes due to chronic exposure to nonionizing radiation: Secondary | ICD-10-CM | POA: Diagnosis not present

## 2022-05-14 DIAGNOSIS — C44229 Squamous cell carcinoma of skin of left ear and external auricular canal: Secondary | ICD-10-CM | POA: Diagnosis not present

## 2022-05-14 DIAGNOSIS — Z08 Encounter for follow-up examination after completed treatment for malignant neoplasm: Secondary | ICD-10-CM | POA: Diagnosis not present

## 2022-05-14 DIAGNOSIS — Z86007 Personal history of in-situ neoplasm of skin: Secondary | ICD-10-CM | POA: Diagnosis not present

## 2022-05-14 DIAGNOSIS — L821 Other seborrheic keratosis: Secondary | ICD-10-CM | POA: Diagnosis not present

## 2022-05-14 DIAGNOSIS — D1801 Hemangioma of skin and subcutaneous tissue: Secondary | ICD-10-CM | POA: Diagnosis not present

## 2022-05-14 DIAGNOSIS — Z85820 Personal history of malignant melanoma of skin: Secondary | ICD-10-CM | POA: Diagnosis not present

## 2022-05-28 DIAGNOSIS — C4442 Squamous cell carcinoma of skin of scalp and neck: Secondary | ICD-10-CM | POA: Diagnosis not present

## 2022-07-15 NOTE — Progress Notes (Unsigned)
Cardiology Office Note   Date:  07/16/2022   ID:  Colin Larson, DOB 13-Feb-1935, MRN [id number]  PCP:  Mayra Neer, MD    No chief complaint on file.  CAD  Wt Readings from Last 3 Encounters:  07/16/22 128 lb (58.1 kg)  08/08/21 133 lb (60.3 kg)  12/02/20 133 lb (60.3 kg)       History of Present Illness: Colin Larson is a 88 y.o. male  with history of CAD (MI 2000 s/p BMS to prox/mid RCA, 70% lesion in abberant LCx that arises from right coronary cusp), carotid disease, mild AS, HTN, HLD, arthritis, Crohn's disease, hiatal hernia, GIB (from diverticulosis/Crohn's 2014), melanoma of the skin who presents for yearly routine follow-up. Last echo 2014: mild LVH, EF 60-65%, mild AS, mild MR. Carotid duplex 07/2016: 88-91% RICA, 6-94% LICA, >50% RECA.   Elevated LFTs in the past when taking more tylenol for back pain.   He had an episode of dizziness, HTN which prompted visit to the ER.  Stroke ruled out.  Potassium and Magnesium were low.  MRI negative. HR noted to drop to 39 bpm.   Had been walking 2-3 miles/day in the past.  He continues to do this at present.    Hospitalized in 11/2020 showed: "Acute COVID-19 infection in a patient who is fully vaccinated - his infection overall appears mild but definitely had caused dehydration requiring IV fluids with hydration and supportive care he is feeling much better and close to his baseline, will be discharged home with close PCP follow-up.  Has qualified for home PT and RN which she will get."   Appetite has decreased.  He has lost some weight recently.    Stays active walking 5 days a week for an hour.  Feels good with the walk.  It is on level ground on a track.  Denies : Chest pain. Dizziness. Leg edema. Nitroglycerin use. Orthopnea. Palpitations. Paroxysmal nocturnal dyspnea. Shortness of breath. Syncope.     Past Medical History:  Diagnosis Date   Anemia    Arthritis    Carotid artery disease ([place])    Coronary  artery disease    a. MI 2000 s/p BMS to prox/mid RCA, 70% lesion in abberant LCx that arises from right coronary cusp per Dr. Hassell Done note   Crohn disease [facility])    GI bleeding    2014 - from diverticulosis/Crohn's 2014   Hiatal hernia    no surgery   Hypercholesteremia    Hypertension    Melanoma ([place]) 01/07/2012   MI (myocardial infarction) [facility]) [id number]   Mild aortic stenosis    Mild mitral regurgitation    Skin cancer     Past Surgical History:  Procedure Laterality Date   CHOLECYSTECTOMY     CORONARY STENT PLACEMENT  [id number]   INGUINAL HERNIA REPAIR     x 2     Current Outpatient Medications  Medication Sig Dispense Refill   acetaminophen (TYLENOL) 500 MG tablet Take 1,000 mg by mouth every 6 (six) hours as needed for mild pain or moderate pain.     Ascorbic Acid (VITAMIN C) 500 MG CHEW 1 tablet Orally Once a day for 30 day(s)     aspirin EC 81 MG tablet Take 81 mg by mouth daily.     atorvastatin (LIPITOR) 80 MG tablet Take 1 tablet (80 mg total) by mouth daily. 90 tablet 1   balsalazide (COLAZAL) 750 MG capsule Take 2 capsules by mouth 2 (two)  times daily.     donepezil (ARICEPT) 5 MG tablet Take 5 mg by mouth at bedtime.     loperamide (IMODIUM A-D) 2 MG tablet 1 tablet as needed Orally Four times a day     Multiple Vitamin (MULTIVITAMIN WITH MINERALS) TABS tablet Take 1 tablet by mouth daily.     Multiple Vitamins-Minerals (CENTRUM SILVER 50+MEN) TABS 1 tablet Orally once a day     omeprazole (PRILOSEC) 20 MG capsule Take 20 mg by mouth daily.     ramipril (ALTACE) 5 MG capsule Take 1 capsule (5 mg total) by mouth daily. 90 capsule 1   nitroGLYCERIN (NITROSTAT) 0.4 MG SL tablet Place 1 tablet (0.4 mg total) under the tongue every 5 (five) minutes as needed for chest pain. 25 tablet 1   Zinc 50 MG TABS Take 1 tablet by mouth daily. (Patient not taking: Reported on 07/16/2022)     No current facility-administered medications for this visit.    Allergies:   Bee  venom    Social History:  The patient  reports that he quit smoking about 47 years ago. His smoking use included cigarettes. He has a 0.70 pack-year smoking history. He has never used smokeless tobacco. He reports that he does not drink alcohol and does not use drugs.   Family History:  The patient's family history includes Cancer in his mother; Stroke in his father.    ROS:  Please see the history of present illness.   Otherwise, review of systems are positive for weight loss.   All other systems are reviewed and negative.    PHYSICAL EXAM: VS:  BP 126/72   Pulse 61   Ht '5\' 5"'$  (1.651 m)   Wt 128 lb (58.1 kg)   SpO2 99%   BMI 21.30 kg/m  , BMI Body mass index is 21.3 kg/m. GEN: Well nourished, well developed, in no acute distress, somewhat frail HEENT: normal Neck: no JVD, carotid bruits, or masses Cardiac: RRR; 2/6 early systolic murmurs, rubs, or gallops,no edema  Respiratory:  clear to auscultation bilaterally, normal work of breathing GI: soft, nontender, nondistended, + BS MS: no deformity or atrophy Skin: warm and dry, no rash Neuro:  Strength and sensation are intact Psych: euthymic mood, full affect   EKG:   The ekg ordered today demonstrates NSR, RBBB   Recent Labs: No results found for requested labs within last 365 days.   Lipid Panel No results found for: "CHOL", "TRIG", "HDL", "CHOLHDL", "VLDL", "LDLCALC", "LDLDIRECT"   Other studies Reviewed: Additional studies/ records that were reviewed today with results demonstrating: labs reviewed.   ASSESSMENT AND PLAN:  CAD: He has been on aspirin and high-dose atorvastatin.  No angina.  Carotid artery disease: Moderate by duplex in 2023.  Scheduled for Feb 2024.  Mild aortic stenosis: Noted in 2019.  No syncope.   Hypertensive heart disease. The current medical regimen is effective;  continue present plan and medications. OSA: no longer uses CPAP.  Has not used in years.    Current medicines are reviewed at  length with the patient today.  The patient concerns regarding his medicines were addressed.  The following changes have been made:  No change  Labs/ tests ordered today include:  No orders of the defined types were placed in this encounter.   Recommend 150 minutes/week of aerobic exercise Low fat, low carb, high fiber diet recommended  Disposition:   FU in 1 year   Signed, Larae Grooms, MD  07/16/2022 3:30 PM  [facility] [address], [place], [place]  [address] Phone: [phone]; Fax: [phone]

## 2022-07-16 ENCOUNTER — Encounter: Payer: Self-pay | Admitting: Interventional Cardiology

## 2022-07-16 ENCOUNTER — Ambulatory Visit: Payer: Medicare Other | Attending: Interventional Cardiology | Admitting: Interventional Cardiology

## 2022-07-16 VITALS — BP 126/72 | HR 61 | Ht 65.0 in | Wt 128.0 lb

## 2022-07-16 DIAGNOSIS — I25118 Atherosclerotic heart disease of native coronary artery with other forms of angina pectoris: Secondary | ICD-10-CM | POA: Diagnosis not present

## 2022-07-16 DIAGNOSIS — I350 Nonrheumatic aortic (valve) stenosis: Secondary | ICD-10-CM | POA: Diagnosis not present

## 2022-07-16 DIAGNOSIS — I6523 Occlusion and stenosis of bilateral carotid arteries: Secondary | ICD-10-CM

## 2022-07-16 DIAGNOSIS — E782 Mixed hyperlipidemia: Secondary | ICD-10-CM | POA: Insufficient documentation

## 2022-07-16 DIAGNOSIS — I119 Hypertensive heart disease without heart failure: Secondary | ICD-10-CM | POA: Insufficient documentation

## 2022-07-16 NOTE — Patient Instructions (Signed)
Medication Instructions:  Your physician recommends that you continue on your current medications as directed. Please refer to the Current Medication list given to you today.  *If you need a refill on your cardiac medications before your next appointment, please call your pharmacy*   Lab Work: none If you have labs (blood work) drawn today and your tests are completely normal, you will receive your results only by: [place] (if you have MyChart) OR A paper copy in the mail If you have any lab test that is abnormal or we need to change your treatment, we will call you to review the results.   Testing/Procedures: Your physician has requested that you have a carotid duplex. This test is an ultrasound of the carotid arteries in your neck. It looks at blood flow through these arteries that supply the brain with blood. Allow one hour for this exam. There are no restrictions or special instructions. Scheduled for August 04, 2022   Follow-Up: At [facility], you and your health needs are our priority.  As part of our continuing mission to provide you with exceptional heart care, we have created designated Provider Care Teams.  These Care Teams include your primary Cardiologist (physician) and Advanced Practice Providers (APPs -  Physician Assistants and Nurse Practitioners) who all work together to provide you with the care you need, when you need it.  We recommend signing up for the patient portal called "MyChart".  Sign up information is provided on this After Visit Summary.  MyChart is used to connect with patients for Virtual Visits (Telemedicine).  Patients are able to view lab/test results, encounter notes, upcoming appointments, etc.  Non-urgent messages can be sent to your provider as well.   To learn more about what you can do with MyChart, go to [email or web address].    Your next appointment:   12 month(s)  Provider:   Larae Grooms, MD     Other  Instructions

## 2022-08-04 ENCOUNTER — Ambulatory Visit (HOSPITAL_COMMUNITY)
Admission: RE | Admit: 2022-08-04 | Discharge: 2022-08-04 | Disposition: A | Discharge status: Home / Self Care | Payer: Medicare Other | Source: Ambulatory Visit | Attending: Interventional Cardiology | Admitting: Interventional Cardiology

## 2022-08-04 DIAGNOSIS — I6523 Occlusion and stenosis of bilateral carotid arteries: Secondary | ICD-10-CM | POA: Insufficient documentation

## 2022-08-11 ENCOUNTER — Other Ambulatory Visit: Payer: Self-pay | Admitting: *Deleted

## 2022-08-11 DIAGNOSIS — I6523 Occlusion and stenosis of bilateral carotid arteries: Secondary | ICD-10-CM

## 2022-08-14 DIAGNOSIS — L578 Other skin changes due to chronic exposure to nonionizing radiation: Secondary | ICD-10-CM | POA: Diagnosis not present

## 2022-08-14 DIAGNOSIS — L570 Actinic keratosis: Secondary | ICD-10-CM | POA: Diagnosis not present

## 2022-08-28 ENCOUNTER — Other Ambulatory Visit (HOSPITAL_COMMUNITY): Payer: Self-pay | Admitting: Gastroenterology

## 2022-08-28 DIAGNOSIS — T17308A Unspecified foreign body in larynx causing other injury, initial encounter: Secondary | ICD-10-CM

## 2022-08-28 DIAGNOSIS — R1310 Dysphagia, unspecified: Secondary | ICD-10-CM

## 2022-09-02 DIAGNOSIS — L570 Actinic keratosis: Secondary | ICD-10-CM | POA: Diagnosis not present

## 2022-09-02 DIAGNOSIS — L244 Irritant contact dermatitis due to drugs in contact with skin: Secondary | ICD-10-CM | POA: Diagnosis not present

## 2022-09-10 ENCOUNTER — Ambulatory Visit (HOSPITAL_COMMUNITY)
Admission: RE | Admit: 2022-09-10 | Discharge: 2022-09-10 | Disposition: A | Discharge status: Home / Self Care | Payer: Medicare Other | Source: Ambulatory Visit | Attending: Gastroenterology | Admitting: Gastroenterology

## 2022-09-10 DIAGNOSIS — K219 Gastro-esophageal reflux disease without esophagitis: Secondary | ICD-10-CM | POA: Diagnosis not present

## 2022-09-10 DIAGNOSIS — R1310 Dysphagia, unspecified: Secondary | ICD-10-CM

## 2022-09-10 DIAGNOSIS — T17308A Unspecified foreign body in larynx causing other injury, initial encounter: Secondary | ICD-10-CM | POA: Insufficient documentation

## 2022-09-22 ENCOUNTER — Encounter: Payer: Self-pay | Admitting: Orthopaedic Surgery

## 2022-09-22 ENCOUNTER — Ambulatory Visit (INDEPENDENT_AMBULATORY_CARE_PROVIDER_SITE_OTHER): Payer: Medicare Other | Admitting: Orthopaedic Surgery

## 2022-09-22 DIAGNOSIS — M25561 Pain in right knee: Secondary | ICD-10-CM

## 2022-09-22 DIAGNOSIS — M25562 Pain in left knee: Secondary | ICD-10-CM

## 2022-09-22 DIAGNOSIS — G8929 Other chronic pain: Secondary | ICD-10-CM

## 2022-09-22 MED ORDER — LIDOCAINE HCL 1 % IJ SOLN
3.0000 mL | INTRAMUSCULAR | Status: AC | PRN
  Administered 2022-09-22: 3 mL

## 2022-09-22 MED ORDER — METHYLPREDNISOLONE ACETATE 40 MG/ML IJ SUSP
40.0000 mg | INTRAMUSCULAR | Status: AC | PRN
  Administered 2022-09-22: 40 mg via INTRA_ARTICULAR

## 2022-09-22 NOTE — Progress Notes (Signed)
The patient is well-known to [place].  He is now 88 years old and has arthritis in both his knees.  He comes in from time to time for steroid injections in his knees.  He last had steroid injections in July of last year so it has been well over 8 months or more.  He says they have been hurting him for a few months now.  He walks slowly but without an assistive device.  He denies any acute changes in medical status.  He is not a diabetic.  Both knees he does have a mild effusion.  Both knees have global tenderness with patellofemoral crepitation and some pain.  Both knees have good motion.  Per his request I did place a steroid injection in both knees which she tolerated well.  All questions and concerns were answered and addressed.  Follow-up is as needed.     Procedure Note  Patient: Colin Larson             Date of Birth: May 06, 1935           MRN: [id number]             Visit Date: 09/22/2022  Procedures: Visit Diagnoses:  1. Chronic pain of right knee   2. Chronic pain of left knee     Large Joint Inj: R knee on 09/22/2022 1:29 PM Indications: diagnostic evaluation and pain Details: 22 G 1.5 in needle, superolateral approach  Arthrogram: No  Medications: 3 mL lidocaine 1 %; 40 mg methylPREDNISolone acetate 40 MG/ML Outcome: tolerated well, no immediate complications Procedure, treatment alternatives, risks and benefits explained, specific risks discussed. Consent was given by the patient. Immediately prior to procedure a time out was called to verify the correct patient, procedure, equipment, support staff and site/side marked as required. Patient was prepped and draped in the usual sterile fashion.    Large Joint Inj: L knee on 09/22/2022 1:29 PM Indications: diagnostic evaluation and pain Details: 22 G 1.5 in needle, superolateral approach  Arthrogram: No  Medications: 3 mL lidocaine 1 %; 40 mg methylPREDNISolone acetate 40 MG/ML Outcome: tolerated well, no immediate  complications Procedure, treatment alternatives, risks and benefits explained, specific risks discussed. Consent was given by the patient. Immediately prior to procedure a time out was called to verify the correct patient, procedure, equipment, support staff and site/side marked as required. Patient was prepped and draped in the usual sterile fashion.

## 2022-09-25 ENCOUNTER — Ambulatory Visit
Admission: RE | Admit: 2022-09-25 | Discharge: 2022-09-25 | Disposition: A | Discharge status: Home / Self Care | Payer: Medicare Other | Source: Ambulatory Visit | Attending: Family Medicine | Admitting: Family Medicine

## 2022-09-25 DIAGNOSIS — M810 Age-related osteoporosis without current pathological fracture: Secondary | ICD-10-CM | POA: Diagnosis not present

## 2022-09-25 DIAGNOSIS — M85832 Other specified disorders of bone density and structure, left forearm: Secondary | ICD-10-CM | POA: Diagnosis not present

## 2022-09-25 DIAGNOSIS — Z79899 Other long term (current) drug therapy: Secondary | ICD-10-CM

## 2022-09-29 DIAGNOSIS — H25813 Combined forms of age-related cataract, bilateral: Secondary | ICD-10-CM | POA: Diagnosis not present

## 2022-10-09 ENCOUNTER — Other Ambulatory Visit (HOSPITAL_COMMUNITY): Payer: Self-pay | Admitting: *Deleted

## 2022-10-09 DIAGNOSIS — R1310 Dysphagia, unspecified: Secondary | ICD-10-CM

## 2022-10-09 DIAGNOSIS — R059 Cough, unspecified: Secondary | ICD-10-CM

## 2022-10-10 DIAGNOSIS — R413 Other amnesia: Secondary | ICD-10-CM | POA: Diagnosis not present

## 2022-10-10 DIAGNOSIS — I25118 Atherosclerotic heart disease of native coronary artery with other forms of angina pectoris: Secondary | ICD-10-CM | POA: Diagnosis not present

## 2022-10-10 DIAGNOSIS — Z0000 Encounter for general adult medical examination without abnormal findings: Secondary | ICD-10-CM | POA: Diagnosis not present

## 2022-10-10 DIAGNOSIS — I700 Atherosclerosis of aorta: Secondary | ICD-10-CM | POA: Diagnosis not present

## 2022-10-10 DIAGNOSIS — Z85820 Personal history of malignant melanoma of skin: Secondary | ICD-10-CM | POA: Diagnosis not present

## 2022-10-10 DIAGNOSIS — K219 Gastro-esophageal reflux disease without esophagitis: Secondary | ICD-10-CM | POA: Diagnosis not present

## 2022-10-10 DIAGNOSIS — K5090 Crohn's disease, unspecified, without complications: Secondary | ICD-10-CM | POA: Diagnosis not present

## 2022-10-10 DIAGNOSIS — E782 Mixed hyperlipidemia: Secondary | ICD-10-CM | POA: Diagnosis not present

## 2022-10-10 DIAGNOSIS — E039 Hypothyroidism, unspecified: Secondary | ICD-10-CM | POA: Diagnosis not present

## 2022-10-10 DIAGNOSIS — I119 Hypertensive heart disease without heart failure: Secondary | ICD-10-CM | POA: Diagnosis not present

## 2022-10-10 LAB — LAB REPORT - SCANNED
Creatinine, POC: 152 mg/dL
EGFR: 85
Microalb Creat Ratio: 6.1
Microalbumin, Urine: 0.93

## 2022-10-14 ENCOUNTER — Ambulatory Visit (HOSPITAL_COMMUNITY)
Admission: RE | Admit: 2022-10-14 | Discharge: 2022-10-14 | Disposition: A | Discharge status: Home / Self Care | Payer: Medicare Other | Source: Ambulatory Visit | Attending: Family Medicine | Admitting: Family Medicine

## 2022-10-14 DIAGNOSIS — R059 Cough, unspecified: Secondary | ICD-10-CM

## 2022-10-14 DIAGNOSIS — R1310 Dysphagia, unspecified: Secondary | ICD-10-CM

## 2022-10-14 DIAGNOSIS — R0989 Other specified symptoms and signs involving the circulatory and respiratory systems: Secondary | ICD-10-CM | POA: Insufficient documentation

## 2022-10-17 ENCOUNTER — Encounter: Payer: Self-pay | Admitting: Family Medicine

## 2022-10-21 DIAGNOSIS — H6121 Impacted cerumen, right ear: Secondary | ICD-10-CM | POA: Diagnosis not present

## 2022-10-22 ENCOUNTER — Telehealth: Payer: Self-pay | Admitting: Orthopaedic Surgery

## 2022-10-22 NOTE — Telephone Encounter (Signed)
Patient states his injection did not help and wanting to know if he can get another injection or another type of injection. Please advise

## 2022-10-24 NOTE — Telephone Encounter (Signed)
VOB submitted for Monovisc, bilateral knee  

## 2022-11-12 DIAGNOSIS — Z08 Encounter for follow-up examination after completed treatment for malignant neoplasm: Secondary | ICD-10-CM | POA: Diagnosis not present

## 2022-11-12 DIAGNOSIS — L821 Other seborrheic keratosis: Secondary | ICD-10-CM | POA: Diagnosis not present

## 2022-11-12 DIAGNOSIS — L570 Actinic keratosis: Secondary | ICD-10-CM | POA: Diagnosis not present

## 2022-11-12 DIAGNOSIS — L814 Other melanin hyperpigmentation: Secondary | ICD-10-CM | POA: Diagnosis not present

## 2022-11-12 DIAGNOSIS — D1801 Hemangioma of skin and subcutaneous tissue: Secondary | ICD-10-CM | POA: Diagnosis not present

## 2022-11-12 DIAGNOSIS — C44622 Squamous cell carcinoma of skin of right upper limb, including shoulder: Secondary | ICD-10-CM | POA: Diagnosis not present

## 2022-11-12 DIAGNOSIS — D485 Neoplasm of uncertain behavior of skin: Secondary | ICD-10-CM | POA: Diagnosis not present

## 2022-11-12 DIAGNOSIS — Z85828 Personal history of other malignant neoplasm of skin: Secondary | ICD-10-CM | POA: Diagnosis not present

## 2022-11-26 ENCOUNTER — Other Ambulatory Visit: Payer: Self-pay

## 2022-11-26 DIAGNOSIS — M1712 Unilateral primary osteoarthritis, left knee: Secondary | ICD-10-CM

## 2022-11-26 DIAGNOSIS — M1711 Unilateral primary osteoarthritis, right knee: Secondary | ICD-10-CM

## 2022-11-27 DIAGNOSIS — K5090 Crohn's disease, unspecified, without complications: Secondary | ICD-10-CM | POA: Diagnosis not present

## 2022-11-27 DIAGNOSIS — E039 Hypothyroidism, unspecified: Secondary | ICD-10-CM | POA: Diagnosis not present

## 2022-11-27 DIAGNOSIS — I119 Hypertensive heart disease without heart failure: Secondary | ICD-10-CM | POA: Diagnosis not present

## 2022-11-27 DIAGNOSIS — Z713 Dietary counseling and surveillance: Secondary | ICD-10-CM | POA: Diagnosis not present

## 2022-11-27 DIAGNOSIS — E782 Mixed hyperlipidemia: Secondary | ICD-10-CM | POA: Diagnosis not present

## 2022-11-27 DIAGNOSIS — K219 Gastro-esophageal reflux disease without esophagitis: Secondary | ICD-10-CM | POA: Diagnosis not present

## 2022-12-05 DIAGNOSIS — E039 Hypothyroidism, unspecified: Secondary | ICD-10-CM | POA: Diagnosis not present

## 2022-12-09 ENCOUNTER — Ambulatory Visit (INDEPENDENT_AMBULATORY_CARE_PROVIDER_SITE_OTHER): Payer: Medicare Other | Admitting: Physician Assistant

## 2022-12-09 ENCOUNTER — Encounter: Payer: Self-pay | Admitting: Physician Assistant

## 2022-12-09 DIAGNOSIS — M1712 Unilateral primary osteoarthritis, left knee: Secondary | ICD-10-CM | POA: Diagnosis not present

## 2022-12-09 DIAGNOSIS — M1711 Unilateral primary osteoarthritis, right knee: Secondary | ICD-10-CM | POA: Diagnosis not present

## 2022-12-09 MED ORDER — HYALURONAN 88 MG/4ML IX SOSY
88.0000 mg | PREFILLED_SYRINGE | INTRA_ARTICULAR | Status: AC | PRN
Start: 2022-12-09 — End: 2022-12-09
  Administered 2022-12-09: 88 mg via INTRA_ARTICULAR

## 2022-12-09 MED ORDER — LIDOCAINE HCL 1 % IJ SOLN
3.0000 mL | INTRAMUSCULAR | Status: AC | PRN
Start: 2022-12-09 — End: 2022-12-09
  Administered 2022-12-09: 3 mL

## 2022-12-09 NOTE — Progress Notes (Signed)
   Procedure Note  Patient: Colin Larson             Date of Birth: [birth year]           MRN: [id number]             Visit Date: 12/09/2022 HPI: [name] returns today for scheduled bilateral Monovisc injections.  He denies any injury to either knee.  Notes swelling of the right knee.  He has known osteoarthritis of both knees he has tried steroid injections.  He does not use an assistive device to ambulate.    Bilateral knees: He does have a significant effusion of the right knee no effusion left knee no abnormal warmth erythema of either knee.  Global tenderness about the right knee.  Both knees overall good range of motion.  Procedures: Visit Diagnoses:  1. Unilateral primary osteoarthritis, right knee   2. Unilateral primary osteoarthritis, left knee     Large Joint Inj: bilateral knee on 12/09/2022 6:05 PM Indications: pain Details: 22 G 1.5 in needle, superolateral approach  Arthrogram: No  Medications (Right): 3 mL lidocaine 1 %; 88 mg Hyaluronan 88 MG/4ML Aspirate (Right): 100 mL yellow Medications (Left): 88 mg Hyaluronan 88 MG/4ML Outcome: tolerated well, no immediate complications Procedure, treatment alternatives, risks and benefits explained, specific risks discussed. Consent was given by the patient. Immediately prior to procedure a time out was called to verify the correct patient, procedure, equipment, support staff and site/side marked as required. Patient was prepped and draped in the usual sterile fashion.     Plan: He knows to wait at least 6 months between viscosupplementation injections.  Right knee was wrapped with an Ace bandage which she will remove before going to bed this evening.  Questions were encouraged and answered at length

## 2022-12-11 ENCOUNTER — Ambulatory Visit: Payer: Medicare Other | Admitting: Physician Assistant

## 2023-01-19 DIAGNOSIS — D485 Neoplasm of uncertain behavior of skin: Secondary | ICD-10-CM | POA: Diagnosis not present

## 2023-01-19 DIAGNOSIS — L570 Actinic keratosis: Secondary | ICD-10-CM | POA: Diagnosis not present

## 2023-01-19 DIAGNOSIS — D0461 Carcinoma in situ of skin of right upper limb, including shoulder: Secondary | ICD-10-CM | POA: Diagnosis not present

## 2023-01-29 ENCOUNTER — Emergency Department (HOSPITAL_COMMUNITY)
Admission: EM | Admit: 2023-01-29 | Discharge: 2023-01-29 | Disposition: A | Discharge status: Home / Self Care | Payer: Medicare Other | Attending: Emergency Medicine | Admitting: Emergency Medicine

## 2023-01-29 ENCOUNTER — Other Ambulatory Visit: Payer: Self-pay

## 2023-01-29 ENCOUNTER — Emergency Department (HOSPITAL_COMMUNITY): Payer: Medicare Other

## 2023-01-29 ENCOUNTER — Encounter (HOSPITAL_COMMUNITY): Payer: Self-pay

## 2023-01-29 DIAGNOSIS — R531 Weakness: Secondary | ICD-10-CM | POA: Diagnosis present

## 2023-01-29 DIAGNOSIS — Z1152 Encounter for screening for COVID-19: Secondary | ICD-10-CM | POA: Insufficient documentation

## 2023-01-29 DIAGNOSIS — Z85820 Personal history of malignant melanoma of skin: Secondary | ICD-10-CM | POA: Diagnosis not present

## 2023-01-29 DIAGNOSIS — Z7982 Long term (current) use of aspirin: Secondary | ICD-10-CM | POA: Diagnosis not present

## 2023-01-29 DIAGNOSIS — Z20822 Contact with and (suspected) exposure to covid-19: Secondary | ICD-10-CM | POA: Diagnosis not present

## 2023-01-29 DIAGNOSIS — Z79899 Other long term (current) drug therapy: Secondary | ICD-10-CM | POA: Insufficient documentation

## 2023-01-29 DIAGNOSIS — I2510 Atherosclerotic heart disease of native coronary artery without angina pectoris: Secondary | ICD-10-CM | POA: Insufficient documentation

## 2023-01-29 DIAGNOSIS — E876 Hypokalemia: Secondary | ICD-10-CM | POA: Diagnosis not present

## 2023-01-29 DIAGNOSIS — I10 Essential (primary) hypertension: Secondary | ICD-10-CM | POA: Insufficient documentation

## 2023-01-29 DIAGNOSIS — R1111 Vomiting without nausea: Secondary | ICD-10-CM | POA: Diagnosis not present

## 2023-01-29 DIAGNOSIS — R5383 Other fatigue: Secondary | ICD-10-CM

## 2023-01-29 DIAGNOSIS — R059 Cough, unspecified: Secondary | ICD-10-CM | POA: Diagnosis not present

## 2023-01-29 DIAGNOSIS — R5381 Other malaise: Secondary | ICD-10-CM | POA: Diagnosis not present

## 2023-01-29 LAB — CBC WITH DIFFERENTIAL/PLATELET
Abs Immature Granulocytes: 0.03 10*3/uL (ref 0.00–0.07)
Basophils Absolute: 0 10*3/uL (ref 0.0–0.1)
Basophils Relative: 0 %
Eosinophils Absolute: 0.1 10*3/uL (ref 0.0–0.5)
Eosinophils Relative: 1 %
HCT: 35.3 % — ABNORMAL LOW (ref 39.0–52.0)
Hemoglobin: 11.5 g/dL — ABNORMAL LOW (ref 13.0–17.0)
Immature Granulocytes: 0 %
Lymphocytes Relative: 7 %
Lymphs Abs: 0.5 10*3/uL — ABNORMAL LOW (ref 0.7–4.0)
MCH: 29.2 pg (ref 26.0–34.0)
MCHC: 32.6 g/dL (ref 30.0–36.0)
MCV: 89.6 fL (ref 80.0–100.0)
Monocytes Absolute: 0.5 10*3/uL (ref 0.1–1.0)
Monocytes Relative: 6 %
Neutro Abs: 7.2 10*3/uL (ref 1.7–7.7)
Neutrophils Relative %: 86 %
Platelets: 203 10*3/uL (ref 150–400)
RBC: 3.94 MIL/uL — ABNORMAL LOW (ref 4.22–5.81)
RDW: 13.7 % (ref 11.5–15.5)
WBC: 8.4 10*3/uL (ref 4.0–10.5)
nRBC: 0 % (ref 0.0–0.2)

## 2023-01-29 LAB — MAGNESIUM: Magnesium: 1 mg/dL — ABNORMAL LOW (ref 1.7–2.4)

## 2023-01-29 LAB — BASIC METABOLIC PANEL
Anion gap: 10 (ref 5–15)
BUN: 12 mg/dL (ref 8–23)
CO2: 25 mmol/L (ref 22–32)
Calcium: 8.8 mg/dL — ABNORMAL LOW (ref 8.9–10.3)
Chloride: 103 mmol/L (ref 98–111)
Creatinine, Ser: 1.04 mg/dL (ref 0.61–1.24)
GFR, Estimated: >60 mL/min (ref >60)
Glucose, Bld: 82 mg/dL (ref 70–99)
Potassium: 3.3 mmol/L — ABNORMAL LOW (ref 3.5–5.1)
Sodium: 138 mmol/L (ref 135–145)

## 2023-01-29 LAB — URINALYSIS, ROUTINE W REFLEX MICROSCOPIC
Bacteria, UA: NONE SEEN
Bilirubin Urine: NEGATIVE
Glucose, UA: NEGATIVE mg/dL
Ketones, ur: 5 mg/dL — AB
Nitrite: NEGATIVE
Protein, ur: NEGATIVE mg/dL
Specific Gravity, Urine: 1.014 (ref 1.005–1.030)
pH: 5 (ref 5.0–8.0)

## 2023-01-29 LAB — RESP PANEL BY RT-PCR (RSV, FLU A&B, COVID)  RVPGX2
Influenza A by PCR: NEGATIVE
Influenza B by PCR: NEGATIVE
Resp Syncytial Virus by PCR: NEGATIVE
SARS Coronavirus 2 by RT PCR: NEGATIVE

## 2023-01-29 LAB — TROPONIN I (HIGH SENSITIVITY)
Troponin I (High Sensitivity): 7 ng/L (ref <18)
Troponin I (High Sensitivity): 9 ng/L (ref <18)

## 2023-01-29 MED ORDER — LACTATED RINGERS IV BOLUS
500.0000 mL | Freq: Once | INTRAVENOUS | Status: AC
  Administered 2023-01-29: 500 mL via INTRAVENOUS

## 2023-01-29 MED ORDER — ACETAMINOPHEN 325 MG PO TABS
650.0000 mg | ORAL_TABLET | Freq: Once | ORAL | Status: AC
  Administered 2023-01-29: 650 mg via ORAL
  Filled 2023-01-29: qty 2

## 2023-01-29 MED ORDER — MAGNESIUM 400 MG PO CAPS: 400.0000 mg | ORAL_CAPSULE | Freq: Every day | ORAL | 0 refills | Status: DC

## 2023-01-29 MED ORDER — ONDANSETRON 4 MG PO TBDP
8.0000 mg | ORAL_TABLET | Freq: Once | ORAL | Status: AC
  Administered 2023-01-29: 8 mg via ORAL
  Filled 2023-01-29: qty 2

## 2023-01-29 MED ORDER — MAGNESIUM SULFATE 2 GM/50ML IV SOLN
2.0000 g | Freq: Once | INTRAVENOUS | Status: AC
  Administered 2023-01-29: 2 g via INTRAVENOUS
  Filled 2023-01-29: qty 50

## 2023-01-29 MED ORDER — POTASSIUM CHLORIDE 10 MEQ/100ML IV SOLN
10.0000 meq | INTRAVENOUS | Status: AC
  Administered 2023-01-29 (×2): 10 meq via INTRAVENOUS
  Filled 2023-01-29 (×2): qty 100

## 2023-01-29 NOTE — Discharge Instructions (Addendum)
Your workup today showed your magnesium level was low at 1.0.  You received 2 g through the IV today.  You also received some potassium.  Your potassium was slightly low at 3.3 not significantly low.  Both of these will need to be rechecked in about 2 to 3 days by her primary care provider to ensure they are within normal and have not dropped any further.  I have sent a magnesium supplement into the pharmacy for you.  If you have any concerning symptoms return to the emergency room.

## 2023-01-29 NOTE — ED Triage Notes (Signed)
Pt is coming form home BIB EMS. Patient states that he was exposed to covid/flu yesterday when he was at church. He states he has not been feeling well. Few episodes of vomiting. Runny nose. More fatigue than normal. Feels warm to the touch but not running a fever. Patient states that he has not drinking anything since yesterday.

## 2023-01-29 NOTE — ED Provider Notes (Signed)
EMERGENCY DEPARTMENT AT [facility] Provider Note   CSN: [id number] Arrival date & time: 01/29/23  4401     History  Chief Complaint  Patient presents with   Covid Exposure    Colin Larson is a 88 y.o. male.  88 year old male with past medical history significant for CAD, mild valvular stenosis, arthritis presents today for concern of generalized weakness that started yesterday.  He states he is now having to use a walker to get around the house.  Typically he ambulates without difficulty and goes on morning walks.  He reports chronic rhinorrhea for the past 3 to 4 months with occasional coughing spells but his coughing spells have been worse in the past couple days.  He states there was a member of his church a week and a half ago that tested positive for COVID.  Denies any other recent sick contacts.  States he had a few episodes of emesis last night.  No abdominal pain.  Denies any chest pain, shortness of breath.  The history is provided by the patient. No language interpreter was used.       Home Medications Prior to Admission medications   Medication Sig Start Date End Date Taking? Authorizing Provider  acetaminophen (TYLENOL) 500 MG tablet Take 1,000 mg by mouth every 6 (six) hours as needed for mild pain or moderate pain.    [provider]  Ascorbic Acid (VITAMIN C) 500 MG CHEW 1 tablet Orally Once a day for 30 day(s)    [provider]  aspirin EC 81 MG tablet Take 81 mg by mouth daily.    [provider]  atorvastatin (LIPITOR) 80 MG tablet Take 1 tablet (80 mg total) by mouth daily. 02/07/21   Corky Crafts, MD  balsalazide (COLAZAL) 750 MG capsule Take 2 capsules by mouth 2 (two) times daily. 08/12/17   [provider]  donepezil (ARICEPT) 5 MG tablet Take 5 mg by mouth at bedtime. 09/09/20   [provider]  loperamide (IMODIUM A-D) 2 MG tablet 1 tablet as needed Orally Four times a day     [provider]  Multiple Vitamin (MULTIVITAMIN WITH MINERALS) TABS tablet Take 1 tablet by mouth daily.    [provider]  Multiple Vitamins-Minerals (CENTRUM SILVER 50+MEN) TABS 1 tablet Orally once a day    [provider]  nitroGLYCERIN (NITROSTAT) 0.4 MG SL tablet Place 1 tablet (0.4 mg total) under the tongue every 5 (five) minutes as needed for chest pain. 02/07/21 05/08/21  Corky Crafts, MD  omeprazole (PRILOSEC) 20 MG capsule Take 20 mg by mouth daily. 08/12/17   [provider]  ramipril (ALTACE) 5 MG capsule Take 1 capsule (5 mg total) by mouth daily. 02/07/21   Corky Crafts, MD  Zinc 50 MG TABS Take 1 tablet by mouth daily. Patient not taking: Reported on 07/16/2022    [provider]      Allergies    Bee venom    Review of Systems   Review of Systems  Constitutional:  Positive for chills. Negative for fever.  HENT:  Positive for rhinorrhea.   Respiratory:  Positive for cough. Negative for shortness of breath.   Gastrointestinal:  Positive for nausea and vomiting. Negative for abdominal pain.  Neurological:  Positive for weakness. Negative for light-headedness.  All other systems reviewed and are negative.   Physical Exam Updated Vital Signs BP (!) 134/57   Pulse 75   Temp 98.6  F (37 C) (Oral)   Resp 17   SpO2 97%  Physical Exam Vitals and nursing note reviewed.  Constitutional:      General: He is not in acute distress.    Appearance: Normal appearance. He is not ill-appearing.  HENT:     Head: Normocephalic and atraumatic.     Nose: Nose normal.  Eyes:     General: No scleral icterus.    Extraocular Movements: Extraocular movements intact.     Conjunctiva/sclera: Conjunctivae normal.  Cardiovascular:     Rate and Rhythm: Normal rate and regular rhythm.     Heart sounds: Normal heart sounds.  Pulmonary:     Effort: Pulmonary effort is normal. No respiratory distress.     Breath sounds: Normal  breath sounds. No wheezing or rales.  Abdominal:     General: There is no distension.     Palpations: Abdomen is soft.     Tenderness: There is no abdominal tenderness. There is no guarding.  Musculoskeletal:        General: Normal range of motion.     Cervical back: Normal range of motion.  Skin:    General: Skin is warm and dry.  Neurological:     General: No focal deficit present.     Mental Status: He is alert and oriented to person, place, and time. Mental status is at baseline.     ED Results / Procedures / Treatments   Labs (all labs ordered are listed, but only abnormal results are displayed) Labs Reviewed  RESP PANEL BY RT-PCR (RSV, FLU A&B, COVID)  RVPGX2  BASIC METABOLIC PANEL  CBC WITH DIFFERENTIAL/PLATELET  MAGNESIUM  TROPONIN I (HIGH SENSITIVITY)    EKG EKG Interpretation Date/Time:  Thursday January 29 2023 08:10:56 EDT Ventricular Rate:  78 PR Interval:  177 QRS Duration:  152 QT Interval:  405 QTC Calculation: 462 R Axis:   -4  Text Interpretation: Sinus rhythm Right bundle branch block No significant change since last tracing Confirmed by Elayne Snare ([id number]) on 01/29/2023 8:19:56 AM  Radiology No results found.  Procedures Procedures    Medications Ordered in ED Medications  lactated ringers bolus 500 mL (has no administration in time range)  acetaminophen (TYLENOL) tablet 650 mg (has no administration in time range)    ED Course/ Medical Decision Making/ A&P                                 Medical Decision Making Amount and/or Complexity of Data Reviewed Labs: ordered. Radiology: ordered.  Risk OTC drugs. Prescription drug management.   Medical Decision Making / ED Course   This patient presents to the ED for concern of generalized weakness, cough, this involves an extensive number of treatment options, and is a complaint that carries with it a high risk of complications and morbidity.  The differential diagnosis includes  viral URI, pneumonia, CAD, MI, gastroenteritis, dehydration  MDM: 88 year old male with past medical history significant as above presents today for concern of generalized weakness since yesterday.  He does report a sick contact about a week ago.  Reports worsening cough.  States he is having to use a walker to get around which he typically does not have to.  Will obtain labs, respiratory panel, chest x-ray, UA.  Will provide half liter fluid bolus and reevaluate.  CBC with hemoglobin 11.5, no leukocytosis.  Hemoglobin is at patient's baseline.  BMP with potassium of  3.3 otherwise without acute concerns.  IV potassium repletion ordered.  COVID, flu, RSV negative.  UA without evidence of UTI.  Magnesium is 1.0.  IV repletion with 2 g ordered.  Initial troponin of 9.  He is without chest pain.  EKG without acute ischemic changes.  Low suspicion for ACS.  On reevaluation following fluid bolus and magnesium repletion patient reports improvement in symptoms.  Will attempt ambulation. This patient is able ambulate independently he is appropriate for discharge.  Discussed with attending.   Lab Tests: -I ordered, reviewed, and interpreted labs.   The pertinent results include:   Labs Reviewed  RESP PANEL BY RT-PCR (RSV, FLU A&B, COVID)  RVPGX2  BASIC METABOLIC PANEL  CBC WITH DIFFERENTIAL/PLATELET  MAGNESIUM  URINALYSIS, ROUTINE W REFLEX MICROSCOPIC  TROPONIN I (HIGH SENSITIVITY)      EKG  EKG Interpretation Date/Time:  Thursday January 29 2023 08:10:56 EDT Ventricular Rate:  78 PR Interval:  177 QRS Duration:  152 QT Interval:  405 QTC Calculation: 462 R Axis:   -4  Text Interpretation: Sinus rhythm Right bundle branch block No significant change since last tracing Confirmed by Elayne Snare ([id number]) on 01/29/2023 8:19:56 AM         Imaging Studies ordered: I ordered imaging studies including chest x-ray I independently visualized and interpreted imaging. I agree with the  radiologist interpretation   Medicines ordered and prescription drug management: Meds ordered this encounter  Medications   lactated ringers bolus 500 mL   acetaminophen (TYLENOL) tablet 650 mg   ondansetron (ZOFRAN-ODT) disintegrating tablet 8 mg    -I have reviewed the patients home medicines and have made adjustments as needed  Critical interventions IV magnesium, and potassium repletion   Cardiac Monitoring: The patient was maintained on a cardiac monitor.  I personally viewed and interpreted the cardiac monitored which showed an underlying rhythm of: Normal sinus rhythm  Reevaluation: After the interventions noted above, I reevaluated the patient and found that they have :improved  Co morbidities that complicate the patient evaluation  Past Medical History:  Diagnosis Date   Anemia    Arthritis    Carotid artery disease ([facility])    Coronary artery disease    a. MI 2000 s/p BMS to prox/mid RCA, 70% lesion in abberant LCx that arises from right coronary cusp per Dr. Hoyle Barr note   Crohn disease [facility])    GI bleeding    2014 - from diverticulosis/Crohn's 2014   Hiatal hernia    no surgery   Hypercholesteremia    Hypertension    Melanoma ([facility]) 01/07/2012   MI (myocardial infarction) ([facility]) [id number]   Mild aortic stenosis    Mild mitral regurgitation    Skin cancer       Dispostion: Patient significantly improved.  Ambulated around the unit without much difficulty.  He independently ambulated.  Feels ready for discharge.  Discussed with daughter as well.  Magnesium supplement prescribed.  Patient discussed with attending as well.  Patient discharged in stable condition.  Discussed that he will need to have electrolytes rechecked in about 2 to 3 days by his PCP.    Final Clinical Impression(s) / ED Diagnoses Final diagnoses:  Hypomagnesemia  Hypokalemia  Fatigue, unspecified type    Rx / DC Orders ED Discharge Orders          Ordered    Magnesium 400 MG CAPS   Daily        01/29/23 1401  Marita [place], PA-C 01/29/23 1401    Theresia Lo [place] K, DO 01/29/23 [id number]

## 2023-01-29 NOTE — ED Notes (Signed)
This RN reviewed discharge instructions with patient and family member. Both verbalized understanding and denied any further questions. PT well appearing upon discharge and denies pain. Pt wheeled to exit. Pt endorses ride home.

## 2023-02-04 ENCOUNTER — Other Ambulatory Visit: Payer: Self-pay | Admitting: Family Medicine

## 2023-02-04 ENCOUNTER — Ambulatory Visit: Admission: RE | Admit: 2023-02-04 | Payer: Medicare Other | Source: Ambulatory Visit

## 2023-02-04 DIAGNOSIS — M79675 Pain in left toe(s): Secondary | ICD-10-CM | POA: Diagnosis not present

## 2023-02-04 DIAGNOSIS — H6121 Impacted cerumen, right ear: Secondary | ICD-10-CM | POA: Diagnosis not present

## 2023-02-04 DIAGNOSIS — M79672 Pain in left foot: Secondary | ICD-10-CM

## 2023-02-04 DIAGNOSIS — E876 Hypokalemia: Secondary | ICD-10-CM | POA: Diagnosis not present

## 2023-02-09 DIAGNOSIS — R790 Abnormal level of blood mineral: Secondary | ICD-10-CM | POA: Diagnosis not present

## 2023-02-09 DIAGNOSIS — E876 Hypokalemia: Secondary | ICD-10-CM | POA: Diagnosis not present

## 2023-02-09 DIAGNOSIS — K5000 Crohn's disease of small intestine without complications: Secondary | ICD-10-CM | POA: Diagnosis not present

## 2023-02-09 DIAGNOSIS — R1319 Other dysphagia: Secondary | ICD-10-CM | POA: Diagnosis not present

## 2023-02-10 DIAGNOSIS — L570 Actinic keratosis: Secondary | ICD-10-CM | POA: Diagnosis not present

## 2023-02-10 DIAGNOSIS — D485 Neoplasm of uncertain behavior of skin: Secondary | ICD-10-CM | POA: Diagnosis not present

## 2023-02-10 DIAGNOSIS — C44729 Squamous cell carcinoma of skin of left lower limb, including hip: Secondary | ICD-10-CM | POA: Diagnosis not present

## 2023-02-23 ENCOUNTER — Encounter: Payer: Self-pay | Admitting: Podiatry

## 2023-02-23 ENCOUNTER — Ambulatory Visit (INDEPENDENT_AMBULATORY_CARE_PROVIDER_SITE_OTHER): Payer: Medicare Other | Admitting: Podiatry

## 2023-02-23 DIAGNOSIS — M19071 Primary osteoarthritis, right ankle and foot: Secondary | ICD-10-CM | POA: Diagnosis not present

## 2023-02-23 DIAGNOSIS — M19072 Primary osteoarthritis, left ankle and foot: Secondary | ICD-10-CM

## 2023-02-23 MED ORDER — BETAMETHASONE SOD PHOS & ACET 6 (3-3) MG/ML IJ SUSP
3.0000 mg | Freq: Once | INTRAMUSCULAR | Status: AC
Start: 2023-02-23 — End: 2023-02-23
  Administered 2023-02-23: 3 mg via INTRA_ARTICULAR

## 2023-02-23 NOTE — Progress Notes (Signed)
   Chief Complaint  Patient presents with   Foot Pain    "He complains about his toe joint being painful." N - toe joint painful L - bunion left D - 6 weeks O - suddenly, about the same C - sharp, ache A - walking and standing, flexing T - Dr. Judithann Sheen - xrays    Subjective:  88 y.o. male presenting today as a new patient with his spouse for evaluation of pain and tenderness associated to the bilateral feet.  Generalized pain especially in the foot and ankles.  He has a history of arthritis throughout his entire body.  He notices stiffness as well.  No history of injury.   Past Medical History:  Diagnosis Date   Anemia    Arthritis    Carotid artery disease (HCC)    Coronary artery disease    a. MI 2000 s/p BMS to prox/mid RCA, 70% lesion in abberant LCx that arises from right coronary cusp per Dr. Hoyle Barr note   Crohn disease [facility])    GI bleeding    2014 - from diverticulosis/Crohn's 2014   Hiatal hernia    no surgery   Hypercholesteremia    Hypertension    Melanoma (HCC) 01/07/2012   MI (myocardial infarction) (HCC) [id number]   Mild aortic stenosis    Mild mitral regurgitation    Skin cancer     Past Surgical History:  Procedure Laterality Date   CHOLECYSTECTOMY     CORONARY STENT PLACEMENT  [id number]   INGUINAL HERNIA REPAIR     x 2    Allergies  Allergen Reactions   Bee Venom Anaphylaxis and Swelling    Objective / Physical Exam:  General:  The patient is alert and oriented x3 in no acute distress. Dermatology:  Skin is warm, dry and supple bilateral lower extremities. Negative for open lesions or macerations. Vascular:  Palpable pedal pulses bilaterally. No edema or erythema noted. Capillary refill within normal limits. Neurological:  Grossly intact via light touch Musculoskeletal Exam:  Pain on palpation to the anterior lateral medial aspects of the patient's bilateral ankles. Mild edema noted. Range of motion within normal limits to all pedal and  ankle joints bilateral. Muscle strength 5/5 in all groups bilateral.   Assessment: 1.  Arthritis/DJD bilateral foot and ankles  Plan of Care:  -Patient was evaluated. X-Rays reviewed.  -Injection of 0.5 mL Celestone Soluspan injected in the patient's bilateral ankle joints.  -Continue wearing good supportive shoes and sneakers.  Advised against going barefoot -Return to clinic as needed   Felecia Shelling, DPM [facility]  Dr. Felecia Shelling, DPM    [address] [place], [place] [address]                Office [phone]  Fax [phone]

## 2023-03-12 DIAGNOSIS — C44729 Squamous cell carcinoma of skin of left lower limb, including hip: Secondary | ICD-10-CM | POA: Diagnosis not present

## 2023-03-19 DIAGNOSIS — Z23 Encounter for immunization: Secondary | ICD-10-CM | POA: Diagnosis not present

## 2023-03-23 ENCOUNTER — Ambulatory Visit (INDEPENDENT_AMBULATORY_CARE_PROVIDER_SITE_OTHER): Payer: Medicare Other | Admitting: Physician Assistant

## 2023-03-23 DIAGNOSIS — M1711 Unilateral primary osteoarthritis, right knee: Secondary | ICD-10-CM | POA: Diagnosis not present

## 2023-03-23 MED ORDER — LIDOCAINE HCL 2 % IJ SOLN
3.0000 mg | INTRAMUSCULAR | Status: AC | PRN
Start: 2023-03-23 — End: 2023-03-23
  Administered 2023-03-23: 3 mg

## 2023-03-23 MED ORDER — LIDOCAINE HCL 1 % IJ SOLN
3.0000 mL | INTRAMUSCULAR | Status: AC | PRN
Start: 2023-03-23 — End: 2023-03-23
  Administered 2023-03-23: 3 mL

## 2023-03-23 MED ORDER — METHYLPREDNISOLONE ACETATE 40 MG/ML IJ SUSP
40.0000 mg | INTRAMUSCULAR | Status: AC | PRN
Start: 2023-03-23 — End: 2023-03-23
  Administered 2023-03-23: 40 mg via INTRA_ARTICULAR

## 2023-03-23 NOTE — Progress Notes (Signed)
Procedure Note  Patient: Colin Larson             Date of Birth: 1935/01/02           MRN: [id number]             Visit Date: 03/23/2023 HPI: Colin Larson comes in today requesting cortisone injections both knees.  Right knee pain is worse than the left.  He states that the viscosupplementation injections on 12/09/2022 giving some relief for about 3 months.  He has had increasing pain in both knees over the past 5 weeks.  He has trouble walking due to bilateral knee pain.  Has known osteoarthritis both knees.  Does report that he had a skin cancer removed from his left leg and is due to have stitches removed this coming Thursday.  Review of systems: Denies fevers or chills.  Bilateral knees: No abnormal warmth erythema or effusion.  Good range of motion of both knees.  Left thigh surgical incision slight erythema.  No purulence or drainage.  Running suture has broken and is draping through the skin.  No wound dehiscence.  Please see photos in media section that were taken today.   Procedures: Visit Diagnoses:  1. Unilateral primary osteoarthritis, right knee     Large Joint Inj: R knee on 03/23/2023 12:11 PM Indications: pain Details: 22 G 1.5 in needle, anterolateral approach  Arthrogram: No  Medications: 3 mL lidocaine 1 %; 40 mg methylPREDNISolone acetate 40 MG/ML; 3 mg lidocaine 2 % Outcome: tolerated well, no immediate complications Procedure, treatment alternatives, risks and benefits explained, specific risks discussed. Consent was given by the patient. Immediately prior to procedure a time out was called to verify the correct patient, procedure, equipment, support staff and site/side marked as required. Patient was prepped and draped in the usual sterile fashion.     Plan: Given the slight erythema of the left thigh would not recommend cortisone injection right knee.  We did remove the stitch as it was draping through the skin and felt to possibly harbor bacteria.  He will  call his dermatologist and let them know that the suture was removed.Also to have them possibly place him on antibiotics if they feel this is necessary.  In regards to the right knee knows to wait 3 months between injections.  Once his left thigh incisions healed and no signs of infection we can perform a left knee steroid injection.  Questions were encouraged and answered.

## 2023-04-16 DIAGNOSIS — H6123 Impacted cerumen, bilateral: Secondary | ICD-10-CM | POA: Diagnosis not present

## 2023-04-16 DIAGNOSIS — I119 Hypertensive heart disease without heart failure: Secondary | ICD-10-CM | POA: Diagnosis not present

## 2023-04-16 DIAGNOSIS — I25118 Atherosclerotic heart disease of native coronary artery with other forms of angina pectoris: Secondary | ICD-10-CM | POA: Diagnosis not present

## 2023-04-16 DIAGNOSIS — R4189 Other symptoms and signs involving cognitive functions and awareness: Secondary | ICD-10-CM | POA: Diagnosis not present

## 2023-04-16 DIAGNOSIS — E782 Mixed hyperlipidemia: Secondary | ICD-10-CM | POA: Diagnosis not present

## 2023-04-16 DIAGNOSIS — E039 Hypothyroidism, unspecified: Secondary | ICD-10-CM | POA: Diagnosis not present

## 2023-04-16 LAB — LAB REPORT - SCANNED: EGFR: 86

## 2023-04-22 NOTE — Progress Notes (Unsigned)
Cardiology Office Note:    Date:  04/22/2023   ID:  Colin Larson, DOB [birth year], MRN [id number]  PCP:  Lupita Raider, MD   [facility] Providers Cardiologist:  Lance Muss, MD { Click to update primary MD,subspecialty MD or APP then REFRESH:1}    Referring MD: Lupita Raider, MD   No chief complaint on file. ***  History of Present Illness:    Colin Larson is a 88 y.o. male seen for follow up CAD. He is a former patient of Dr Eldridge Dace. He has a history of CAD (MI 2000 s/p BMS to prox/mid RCA, 70% lesion in abberant LCx that arises from right coronary cusp), carotid disease, mild AS, HTN, HLD, arthritis, Crohn's disease, hiatal hernia, GIB (from diverticulosis/Crohn's 2014), melanoma of the skin. Last seen in Feb 2023.   Past Medical History:  Diagnosis Date   Anemia    Arthritis    Carotid artery disease (HCC)    Coronary artery disease    a. MI 2000 s/p BMS to prox/mid RCA, 70% lesion in abberant LCx that arises from right coronary cusp per Dr. Hoyle Barr note   Crohn disease [facility])    GI bleeding    2014 - from diverticulosis/Crohn's 2014   Hiatal hernia    no surgery   Hypercholesteremia    Hypertension    Melanoma (HCC) 01/07/2012   MI (myocardial infarction) (HCC) [id number]   Mild aortic stenosis    Mild mitral regurgitation    Skin cancer     Past Surgical History:  Procedure Laterality Date   CHOLECYSTECTOMY     CORONARY STENT PLACEMENT  [id number]   INGUINAL HERNIA REPAIR     x 2    Current Medications: No outpatient medications have been marked as taking for the 04/27/23 encounter (Appointment) with [place], Kindle Strohmeier M, MD.     Allergies:   Bee venom   Social History   Socioeconomic History   Marital status: Married    Spouse name: Not on file   Number of children: Not on file   Years of education: Not on file   Highest education level: Not on file  Occupational History   Not on file  Tobacco Use   Smoking status: Former     Current packs/day: 0.00    Average packs/day: 0.1 packs/day for 5.0 years (0.7 ttl pk-yrs)    Types: Cigarettes    Start date: 02/14/1970    Quit date: 02/15/1975    Years since quitting: 48.2   Smokeless tobacco: Never  Substance and Sexual Activity   Alcohol use: No   Drug use: No   Sexual activity: Not on file  Other Topics Concern   Not on file  Social History Narrative   Not on file   Social Determinants of Health   Financial Resource Strain: Not on file  Food Insecurity: Not on file  Transportation Needs: Not on file  Physical Activity: Not on file  Stress: Not on file  Social Connections: Not on file     Family History: The patient's ***family history includes Cancer in his mother; Stroke in his father.  ROS:   Please see the history of present illness.    *** All other systems reviewed and are negative.  EKGs/Labs/Other Studies Reviewed:    The following studies were reviewed today: Echo 12/01/17: Study Conclusions   - Left ventricle: The cavity size was normal. There was moderate    concentric hypertrophy. Systolic function was vigorous. The  estimated ejection fraction was in the range of 65% to 70%. Wall    motion was normal; there were no regional wall motion    abnormalities. Doppler parameters are consistent with abnormal    left ventricular relaxation (grade 1 diastolic dysfunction). The    E/e&' ratio is >15, suggesting elevated LV filling pressure.  - Aortic valve: Mildly calcified leaflets. Mild to moderate    stenosis. Mean gradient (S): 10 mm Hg. Peak gradient (S): 27 mm    Hg. Valve area (VTI): 1.31 cm^2. Valve area (Vmax): 1.09 cm^2.    Valve area (Vmean): 1.24 cm^2.  - Mitral valve: Mildly thickened leaflets . There was mild    regurgitation.  - Left atrium: The atrium was mildly dilated.  - Inferior vena cava: The vessel was dilated. The respirophasic    diameter changes were blunted (< 50%), consistent with elevated    central venous  pressure.   Holter monitor 01/07/18: Study Highlights  Normal sinus rhythm with PACs, PVCs. Short runs of SVT, up to 163 bpm, 11 beats longest duration. Average HR 71 bpm. Slowest HR 46 bpm presumably during sleep.  Carotid dopplers 08/04/22: ummary:  Right Carotid: Velocities in the right ICA are consistent with a 40-59%                 stenosis. The ECA appears >50% stenosed.   Left Carotid: Velocities in the left ICA are consistent with a 1-39%  stenosis.   Vertebrals: Bilateral vertebral arteries demonstrate antegrade flow.  Subclavians: Normal flow hemodynamics were seen in bilateral subclavian               arteries.       Recent Labs: 01/29/2023: BUN 12; Creatinine, Ser 1.04; Hemoglobin 11.5; Magnesium 1.0; Platelets 203; Potassium 3.3; Sodium 138  Recent Lipid Panel No results found for: "CHOL", "TRIG", "HDL", "CHOLHDL", "VLDL", "LDLCALC", "LDLDIRECT" Dated 10/10/22: cholesterol 118, triglycerides 77, HDL 34, LDL 69. CMET normal   Risk Assessment/Calculations:   {Does this patient have ATRIAL FIBRILLATION?:[phone]}  No BP recorded.  {Refresh Note OR Click here to enter BP  :1}***         Physical Exam:    VS:  There were no vitals taken for this visit.    Wt Readings from Last 3 Encounters:  07/16/22 128 lb (58.1 kg)  08/08/21 133 lb (60.3 kg)  12/02/20 133 lb (60.3 kg)     GEN: *** Well nourished, well developed in no acute distress HEENT: Normal NECK: No JVD; No carotid bruits LYMPHATICS: No lymphadenopathy CARDIAC: ***RRR, no murmurs, rubs, gallops RESPIRATORY:  Clear to auscultation without rales, wheezing or rhonchi  ABDOMEN: Soft, non-tender, non-distended MUSCULOSKELETAL:  No edema; No deformity  SKIN: Warm and dry NEUROLOGIC:  Alert and oriented x 3 PSYCHIATRIC:  Normal affect   ASSESSMENT:    No diagnosis found. PLAN:    In order of problems listed above:  CAD: No angina on medical therapy.  Continue aspirin.  High dose atorvastatin.  LDL  62 in April 2022. Carotid artery disease: Moderate by Duplex in 07/2021.  Unchanged.  Mild AS: noted in 2019. No sx of severe AS.  Hypertensive heart disease: The current medical regimen is effective;  continue present plan and medications.  Normal renal function in April 2022.      {Are you ordering a CV Procedure (e.g. stress test, cath, DCCV, TEE, etc)?   Press F2        :[id number]}    Medication Adjustments/Labs  and Tests Ordered: Current medicines are reviewed at length with the patient today.  Concerns regarding medicines are outlined above.  No orders of the defined types were placed in this encounter.  No orders of the defined types were placed in this encounter.   There are no Patient Instructions on file for this visit.   Signed, Kiley Torrence [place], MD  04/22/2023 7:51 AM    [facility]

## 2023-04-27 ENCOUNTER — Ambulatory Visit: Payer: Medicare Other | Attending: Cardiology | Admitting: Cardiology

## 2023-04-27 ENCOUNTER — Encounter: Payer: Self-pay | Admitting: Cardiology

## 2023-04-27 VITALS — BP 126/60 | HR 52 | Ht 65.0 in | Wt 126.2 lb

## 2023-04-27 DIAGNOSIS — I6523 Occlusion and stenosis of bilateral carotid arteries: Secondary | ICD-10-CM | POA: Insufficient documentation

## 2023-04-27 DIAGNOSIS — I350 Nonrheumatic aortic (valve) stenosis: Secondary | ICD-10-CM | POA: Insufficient documentation

## 2023-04-27 DIAGNOSIS — I119 Hypertensive heart disease without heart failure: Secondary | ICD-10-CM | POA: Insufficient documentation

## 2023-04-27 DIAGNOSIS — I25118 Atherosclerotic heart disease of native coronary artery with other forms of angina pectoris: Secondary | ICD-10-CM | POA: Insufficient documentation

## 2023-04-27 MED ORDER — NITROGLYCERIN 0.4 MG SL SUBL: 0.4000 mg | SUBLINGUAL_TABLET | SUBLINGUAL | 1 refills | Status: AC | PRN

## 2023-04-27 NOTE — Patient Instructions (Signed)
 Medication Instructions:  Continue same medications *If you need a refill on your cardiac medications before your next appointment, please call your pharmacy*   Lab Work: None ordered   Testing/Procedures: None ordered   Follow-Up: At [facility], you and your health needs are our priority.  As part of our continuing mission to provide you with exceptional heart care, we have created designated Provider Care Teams.  These Care Teams include your primary Cardiologist (physician) and Advanced Practice Providers (APPs -  Physician Assistants and Nurse Practitioners) who all work together to provide you with the care you need, when you need it.  We recommend signing up for the patient portal called "MyChart".  Sign up information is provided on this After Visit Summary.  MyChart is used to connect with patients for Virtual Visits (Telemedicine).  Patients are able to view lab/test results, encounter notes, upcoming appointments, etc.  Non-urgent messages can be sent to your provider as well.   To learn more about what you can do with MyChart, go to [email or web address].    Your next appointment:  1 year  Call in July to schedule Nov appointment    Provider:  Dr.Jordan

## 2023-04-30 DIAGNOSIS — R945 Abnormal results of liver function studies: Secondary | ICD-10-CM | POA: Diagnosis not present

## 2023-05-18 DIAGNOSIS — Z08 Encounter for follow-up examination after completed treatment for malignant neoplasm: Secondary | ICD-10-CM | POA: Diagnosis not present

## 2023-05-18 DIAGNOSIS — Z872 Personal history of diseases of the skin and subcutaneous tissue: Secondary | ICD-10-CM | POA: Diagnosis not present

## 2023-05-18 DIAGNOSIS — Z85820 Personal history of malignant melanoma of skin: Secondary | ICD-10-CM | POA: Diagnosis not present

## 2023-05-18 DIAGNOSIS — D225 Melanocytic nevi of trunk: Secondary | ICD-10-CM | POA: Diagnosis not present

## 2023-05-18 DIAGNOSIS — Z85828 Personal history of other malignant neoplasm of skin: Secondary | ICD-10-CM | POA: Diagnosis not present

## 2023-05-18 DIAGNOSIS — L570 Actinic keratosis: Secondary | ICD-10-CM | POA: Diagnosis not present

## 2023-05-18 DIAGNOSIS — L821 Other seborrheic keratosis: Secondary | ICD-10-CM | POA: Diagnosis not present

## 2023-05-18 DIAGNOSIS — L814 Other melanin hyperpigmentation: Secondary | ICD-10-CM | POA: Diagnosis not present

## 2023-05-26 DIAGNOSIS — K219 Gastro-esophageal reflux disease without esophagitis: Secondary | ICD-10-CM | POA: Diagnosis not present

## 2023-05-26 DIAGNOSIS — K5080 Crohn's disease of both small and large intestine without complications: Secondary | ICD-10-CM | POA: Diagnosis not present

## 2023-07-13 DIAGNOSIS — I119 Hypertensive heart disease without heart failure: Secondary | ICD-10-CM | POA: Diagnosis not present

## 2023-07-13 DIAGNOSIS — J988 Other specified respiratory disorders: Secondary | ICD-10-CM | POA: Diagnosis not present

## 2023-07-13 DIAGNOSIS — J101 Influenza due to other identified influenza virus with other respiratory manifestations: Secondary | ICD-10-CM | POA: Diagnosis not present

## 2023-07-13 DIAGNOSIS — R509 Fever, unspecified: Secondary | ICD-10-CM | POA: Diagnosis not present

## 2023-07-13 DIAGNOSIS — R058 Other specified cough: Secondary | ICD-10-CM | POA: Diagnosis not present

## 2023-08-04 ENCOUNTER — Telehealth: Payer: Self-pay | Admitting: Cardiology

## 2023-08-04 DIAGNOSIS — I6523 Occlusion and stenosis of bilateral carotid arteries: Secondary | ICD-10-CM

## 2023-08-04 NOTE — Telephone Encounter (Signed)
Pt spouse called in to cancel carotid test and push out till March 24th. The order expires 08/12/23. Can a new order be put in please.

## 2023-08-04 NOTE — Telephone Encounter (Signed)
[place], Peter M, MD  You57 minutes ago (10:20 AM)    Yes ok to order carotid dopplers  Peter [place] MD, [facility]   Noted  New order placed

## 2023-08-07 ENCOUNTER — Inpatient Hospital Stay (HOSPITAL_COMMUNITY): Admission: RE | Admit: 2023-08-07 | Payer: Medicare Other | Source: Ambulatory Visit

## 2023-09-07 ENCOUNTER — Ambulatory Visit (HOSPITAL_COMMUNITY)
Admission: RE | Admit: 2023-09-07 | Discharge: 2023-09-07 | Disposition: A | Discharge status: Home / Self Care | Payer: Medicare Other | Source: Ambulatory Visit | Attending: Cardiology | Admitting: Cardiology

## 2023-09-07 ENCOUNTER — Other Ambulatory Visit: Payer: Self-pay

## 2023-09-07 DIAGNOSIS — I6523 Occlusion and stenosis of bilateral carotid arteries: Secondary | ICD-10-CM | POA: Diagnosis not present

## 2023-09-10 DIAGNOSIS — I25118 Atherosclerotic heart disease of native coronary artery with other forms of angina pectoris: Secondary | ICD-10-CM | POA: Diagnosis not present

## 2023-09-10 DIAGNOSIS — I700 Atherosclerosis of aorta: Secondary | ICD-10-CM | POA: Diagnosis not present

## 2023-09-10 DIAGNOSIS — I119 Hypertensive heart disease without heart failure: Secondary | ICD-10-CM | POA: Diagnosis not present

## 2023-09-14 DIAGNOSIS — E782 Mixed hyperlipidemia: Secondary | ICD-10-CM | POA: Diagnosis not present

## 2023-09-14 DIAGNOSIS — I119 Hypertensive heart disease without heart failure: Secondary | ICD-10-CM | POA: Diagnosis not present

## 2023-09-14 DIAGNOSIS — I25118 Atherosclerotic heart disease of native coronary artery with other forms of angina pectoris: Secondary | ICD-10-CM | POA: Diagnosis not present

## 2023-09-14 DIAGNOSIS — E039 Hypothyroidism, unspecified: Secondary | ICD-10-CM | POA: Diagnosis not present

## 2023-09-17 DIAGNOSIS — L570 Actinic keratosis: Secondary | ICD-10-CM | POA: Diagnosis not present

## 2023-10-09 DIAGNOSIS — I25118 Atherosclerotic heart disease of native coronary artery with other forms of angina pectoris: Secondary | ICD-10-CM | POA: Diagnosis not present

## 2023-10-09 DIAGNOSIS — I119 Hypertensive heart disease without heart failure: Secondary | ICD-10-CM | POA: Diagnosis not present

## 2023-10-09 DIAGNOSIS — I700 Atherosclerosis of aorta: Secondary | ICD-10-CM | POA: Diagnosis not present

## 2023-10-12 ENCOUNTER — Ambulatory Visit: Admitting: Orthopaedic Surgery

## 2023-10-12 DIAGNOSIS — M1711 Unilateral primary osteoarthritis, right knee: Secondary | ICD-10-CM

## 2023-10-12 DIAGNOSIS — M25561 Pain in right knee: Secondary | ICD-10-CM | POA: Diagnosis not present

## 2023-10-12 DIAGNOSIS — G8929 Other chronic pain: Secondary | ICD-10-CM

## 2023-10-12 DIAGNOSIS — M1712 Unilateral primary osteoarthritis, left knee: Secondary | ICD-10-CM | POA: Diagnosis not present

## 2023-10-12 DIAGNOSIS — M25562 Pain in left knee: Secondary | ICD-10-CM | POA: Diagnosis not present

## 2023-10-12 DIAGNOSIS — M25512 Pain in left shoulder: Secondary | ICD-10-CM

## 2023-10-12 MED ORDER — LIDOCAINE HCL 1 % IJ SOLN
3.0000 mL | INTRAMUSCULAR | Status: AC | PRN
Start: 2023-10-12 — End: 2023-10-12
  Administered 2023-10-12: 3 mL

## 2023-10-12 MED ORDER — METHYLPREDNISOLONE ACETATE 40 MG/ML IJ SUSP
40.0000 mg | INTRAMUSCULAR | Status: AC | PRN
  Administered 2023-10-12: 40 mg via INTRA_ARTICULAR

## 2023-10-12 NOTE — Progress Notes (Signed)
 The patient is an 89 year old gentleman who comes in with several different complaints today.  We have seen him for his knees before and he has known arthritis in both knees and gets recurrent effusion of the right knee.  It has been about 7 months since he has had injections in both knees and is asking for injection of both knees today.  He also wanted take a look at his left shoulder and his left foot.  He has no known shoulder injury but does have pain that wakes him up at night.  He is right-hand dominant.  Has been slowly getting worse for some time.  He also reports left foot and ankle swelling at night with no known injury.  He is not a diabetic.  He denies any acute change in medical status.  Examination of his left shoulder does show rotator cuff deficits and likely some rotator cuff arthropathy.  There is pain throughout the arc of motion of his shoulder.  Examination of both knees shows a large effusion of the right knee but no effusion left knee.  Both knees have patellofemoral crepitation and medial joint line tenderness.  Examination of his left foot and ankle shows no swelling today.  Per his request I did place a steroid injection in both knees today.  However I did aspirated placed 50 cc of clear fluid from the right knee.  Also placed a steroid injection in the left shoulder subacromial outlet which she tolerated well.  He knows to wait at least 3 to 4 months between injections.  All questions concerns were answered and addressed.  Follow-up can be as needed.    Procedure Note  Patient: Colin Larson             Date of Birth: 01/08/1935           MRN: [id number]             Visit Date: 10/12/2023  Procedures: Visit Diagnoses:  1. Chronic pain of right knee   2. Chronic pain of left knee   3. Unilateral primary osteoarthritis, right knee   4. Unilateral primary osteoarthritis, left knee   5. Chronic left shoulder pain     Large Joint Inj: L subacromial bursa on 10/12/2023  9:37 AM Indications: pain and diagnostic evaluation Details: 22 G 1.5 in needle  Arthrogram: No  Medications: 3 mL lidocaine  1 %; 40 mg methylPREDNISolone  acetate 40 MG/ML Outcome: tolerated well, no immediate complications Procedure, treatment alternatives, risks and benefits explained, specific risks discussed. Consent was given by the patient. Immediately prior to procedure a time out was called to verify the correct patient, procedure, equipment, support staff and site/side marked as required. Patient was prepped and draped in the usual sterile fashion.    Large Joint Inj: R knee on 10/12/2023 9:37 AM Indications: diagnostic evaluation and pain Details: 22 G 1.5 in needle, superolateral approach  Arthrogram: No  Medications: 3 mL lidocaine  1 %; 40 mg methylPREDNISolone  acetate 40 MG/ML Outcome: tolerated well, no immediate complications Procedure, treatment alternatives, risks and benefits explained, specific risks discussed. Consent was given by the patient. Immediately prior to procedure a time out was called to verify the correct patient, procedure, equipment, support staff and site/side marked as required. Patient was prepped and draped in the usual sterile fashion.    Large Joint Inj: L knee on 10/12/2023 9:38 AM Indications: diagnostic evaluation and pain Details: 22 G 1.5 in needle, superolateral approach  Arthrogram: No  Medications: 3  mL lidocaine  1 %; 40 mg methylPREDNISolone  acetate 40 MG/ML Outcome: tolerated well, no immediate complications Procedure, treatment alternatives, risks and benefits explained, specific risks discussed. Consent was given by the patient. Immediately prior to procedure a time out was called to verify the correct patient, procedure, equipment, support staff and site/side marked as required. Patient was prepped and draped in the usual sterile fashion.

## 2023-10-14 DIAGNOSIS — E782 Mixed hyperlipidemia: Secondary | ICD-10-CM | POA: Diagnosis not present

## 2023-10-14 DIAGNOSIS — K219 Gastro-esophageal reflux disease without esophagitis: Secondary | ICD-10-CM | POA: Diagnosis not present

## 2023-10-14 DIAGNOSIS — H6123 Impacted cerumen, bilateral: Secondary | ICD-10-CM | POA: Diagnosis not present

## 2023-10-14 DIAGNOSIS — I700 Atherosclerosis of aorta: Secondary | ICD-10-CM | POA: Diagnosis not present

## 2023-10-14 DIAGNOSIS — M8580 Other specified disorders of bone density and structure, unspecified site: Secondary | ICD-10-CM | POA: Diagnosis not present

## 2023-10-14 DIAGNOSIS — I119 Hypertensive heart disease without heart failure: Secondary | ICD-10-CM | POA: Diagnosis not present

## 2023-10-14 DIAGNOSIS — Z1331 Encounter for screening for depression: Secondary | ICD-10-CM | POA: Diagnosis not present

## 2023-10-14 DIAGNOSIS — K5090 Crohn's disease, unspecified, without complications: Secondary | ICD-10-CM | POA: Diagnosis not present

## 2023-10-14 DIAGNOSIS — Z85820 Personal history of malignant melanoma of skin: Secondary | ICD-10-CM | POA: Diagnosis not present

## 2023-10-14 DIAGNOSIS — I25118 Atherosclerotic heart disease of native coronary artery with other forms of angina pectoris: Secondary | ICD-10-CM | POA: Diagnosis not present

## 2023-10-14 DIAGNOSIS — Z0000 Encounter for general adult medical examination without abnormal findings: Secondary | ICD-10-CM | POA: Diagnosis not present

## 2023-10-14 DIAGNOSIS — E039 Hypothyroidism, unspecified: Secondary | ICD-10-CM | POA: Diagnosis not present

## 2023-10-14 DIAGNOSIS — R413 Other amnesia: Secondary | ICD-10-CM | POA: Diagnosis not present

## 2023-10-14 LAB — LAB REPORT - SCANNED
Creatinine, POC: 30 mg/dL
EGFR: 79

## 2023-11-08 DIAGNOSIS — I700 Atherosclerosis of aorta: Secondary | ICD-10-CM | POA: Diagnosis not present

## 2023-11-08 DIAGNOSIS — I25118 Atherosclerotic heart disease of native coronary artery with other forms of angina pectoris: Secondary | ICD-10-CM | POA: Diagnosis not present

## 2023-11-08 DIAGNOSIS — I119 Hypertensive heart disease without heart failure: Secondary | ICD-10-CM | POA: Diagnosis not present

## 2023-11-14 DIAGNOSIS — I25118 Atherosclerotic heart disease of native coronary artery with other forms of angina pectoris: Secondary | ICD-10-CM | POA: Diagnosis not present

## 2023-11-14 DIAGNOSIS — I119 Hypertensive heart disease without heart failure: Secondary | ICD-10-CM | POA: Diagnosis not present

## 2023-11-14 DIAGNOSIS — E039 Hypothyroidism, unspecified: Secondary | ICD-10-CM | POA: Diagnosis not present

## 2023-11-14 DIAGNOSIS — E782 Mixed hyperlipidemia: Secondary | ICD-10-CM | POA: Diagnosis not present

## 2023-11-14 DIAGNOSIS — I700 Atherosclerosis of aorta: Secondary | ICD-10-CM | POA: Diagnosis not present

## 2023-11-25 DIAGNOSIS — H2513 Age-related nuclear cataract, bilateral: Secondary | ICD-10-CM | POA: Diagnosis not present

## 2023-11-25 DIAGNOSIS — H52203 Unspecified astigmatism, bilateral: Secondary | ICD-10-CM | POA: Diagnosis not present

## 2023-12-08 DIAGNOSIS — I25118 Atherosclerotic heart disease of native coronary artery with other forms of angina pectoris: Secondary | ICD-10-CM | POA: Diagnosis not present

## 2023-12-08 DIAGNOSIS — I700 Atherosclerosis of aorta: Secondary | ICD-10-CM | POA: Diagnosis not present

## 2023-12-08 DIAGNOSIS — I119 Hypertensive heart disease without heart failure: Secondary | ICD-10-CM | POA: Diagnosis not present

## 2023-12-14 DIAGNOSIS — I25118 Atherosclerotic heart disease of native coronary artery with other forms of angina pectoris: Secondary | ICD-10-CM | POA: Diagnosis not present

## 2023-12-14 DIAGNOSIS — I119 Hypertensive heart disease without heart failure: Secondary | ICD-10-CM | POA: Diagnosis not present

## 2023-12-14 DIAGNOSIS — I700 Atherosclerosis of aorta: Secondary | ICD-10-CM | POA: Diagnosis not present

## 2024-01-14 DIAGNOSIS — I119 Hypertensive heart disease without heart failure: Secondary | ICD-10-CM | POA: Diagnosis not present

## 2024-01-14 DIAGNOSIS — I700 Atherosclerosis of aorta: Secondary | ICD-10-CM | POA: Diagnosis not present

## 2024-01-14 DIAGNOSIS — H2511 Age-related nuclear cataract, right eye: Secondary | ICD-10-CM | POA: Diagnosis not present

## 2024-01-14 DIAGNOSIS — I25118 Atherosclerotic heart disease of native coronary artery with other forms of angina pectoris: Secondary | ICD-10-CM | POA: Diagnosis not present

## 2024-01-14 DIAGNOSIS — Z961 Presence of intraocular lens: Secondary | ICD-10-CM | POA: Diagnosis not present

## 2024-02-16 ENCOUNTER — Ambulatory Visit
Admission: RE | Admit: 2024-02-16 | Discharge: 2024-02-16 | Disposition: A | Discharge status: Home / Self Care | Source: Ambulatory Visit | Attending: Family Medicine | Admitting: Family Medicine

## 2024-02-16 ENCOUNTER — Other Ambulatory Visit: Payer: Self-pay | Admitting: Family Medicine

## 2024-02-16 DIAGNOSIS — R109 Unspecified abdominal pain: Secondary | ICD-10-CM

## 2024-02-16 DIAGNOSIS — K6389 Other specified diseases of intestine: Secondary | ICD-10-CM | POA: Diagnosis not present

## 2024-02-16 DIAGNOSIS — R1032 Left lower quadrant pain: Secondary | ICD-10-CM | POA: Diagnosis not present

## 2024-02-16 MED ORDER — IOPAMIDOL (ISOVUE-370) INJECTION 76%
80.0000 mL | Freq: Once | INTRAVENOUS | Status: AC | PRN
  Administered 2024-02-16: 80 mL via INTRAVENOUS

## 2024-02-19 DIAGNOSIS — K5080 Crohn's disease of both small and large intestine without complications: Secondary | ICD-10-CM | POA: Diagnosis not present

## 2024-02-25 DIAGNOSIS — H2512 Age-related nuclear cataract, left eye: Secondary | ICD-10-CM | POA: Diagnosis not present

## 2024-02-25 DIAGNOSIS — H25812 Combined forms of age-related cataract, left eye: Secondary | ICD-10-CM | POA: Diagnosis not present

## 2024-02-25 DIAGNOSIS — Z961 Presence of intraocular lens: Secondary | ICD-10-CM | POA: Diagnosis not present

## 2024-03-07 ENCOUNTER — Other Ambulatory Visit (INDEPENDENT_AMBULATORY_CARE_PROVIDER_SITE_OTHER): Payer: Self-pay

## 2024-03-07 ENCOUNTER — Ambulatory Visit (INDEPENDENT_AMBULATORY_CARE_PROVIDER_SITE_OTHER): Admitting: Orthopaedic Surgery

## 2024-03-07 ENCOUNTER — Encounter: Payer: Self-pay | Admitting: Orthopaedic Surgery

## 2024-03-07 DIAGNOSIS — M25562 Pain in left knee: Secondary | ICD-10-CM | POA: Diagnosis not present

## 2024-03-07 DIAGNOSIS — M25561 Pain in right knee: Secondary | ICD-10-CM | POA: Diagnosis not present

## 2024-03-07 DIAGNOSIS — G8929 Other chronic pain: Secondary | ICD-10-CM

## 2024-03-07 DIAGNOSIS — M25512 Pain in left shoulder: Secondary | ICD-10-CM | POA: Diagnosis not present

## 2024-03-07 MED ORDER — LIDOCAINE HCL 1 % IJ SOLN
3.0000 mL | INTRAMUSCULAR | Status: AC | PRN
  Administered 2024-03-07: 3 mL

## 2024-03-07 MED ORDER — METHYLPREDNISOLONE ACETATE 40 MG/ML IJ SUSP
40.0000 mg | INTRAMUSCULAR | Status: AC | PRN
  Administered 2024-03-07: 40 mg via INTRA_ARTICULAR

## 2024-03-07 NOTE — Progress Notes (Signed)
 The patient is an elderly 89 year old gentleman who we have seen planing at times in the past with bilateral knee pain and left shoulder pain.  We have x-rayed his knees about 2-1/2 years ago showing still some space left.  We had not x-rayed his shoulder so we do need to x-ray his shoulder today.  He is not a diabetic.  He is asking for steroid injections in both knees and his left shoulder today.  We have done this in the past and it has been at least 5 months since he has had injections.  Examination of his left shoulder shows some grinding at the general joint but shoulder is well located and surprising moves well.  He does state that his pain wakes him up at night and is not bad during the day.  Both knees show no effusion with painful range of motion and some patellofemoral crepitation.  3 views of his left shoulder does show bone-on-bone glenohumeral arthritis with significant loss of joint space.  The humeral head is not high riding.  I went over the shoulder x-rays with him and his wife.  We will continue to pursue conservative treatment with injections of steroid in both knees and left shoulder today.  He did tolerate injections in both knees and his shoulder today.  They know to wait about 4 to 5 months between injections.    Procedure Note  Patient: Colin Larson             Date of Birth: 09-07-1934           MRN: [id number]             Visit Date: 03/07/2024  Procedures: Visit Diagnoses:  1. Chronic left shoulder pain   2. Chronic pain of right knee   3. Chronic pain of left knee     Large Joint Inj: R knee on 03/07/2024 1:32 PM Indications: diagnostic evaluation and pain Details: 22 G 1.5 in needle, superolateral approach  Arthrogram: No  Medications: 3 mL lidocaine  1 %; 40 mg methylPREDNISolone  acetate 40 MG/ML Outcome: tolerated well, no immediate complications Procedure, treatment alternatives, risks and benefits explained, specific risks discussed. Consent was  given by the patient. Immediately prior to procedure a time out was called to verify the correct patient, procedure, equipment, support staff and site/side marked as required. Patient was prepped and draped in the usual sterile fashion.    Large Joint Inj: L knee on 03/07/2024 1:32 PM Indications: diagnostic evaluation and pain Details: 22 G 1.5 in needle, superolateral approach  Arthrogram: No  Medications: 3 mL lidocaine  1 %; 40 mg methylPREDNISolone  acetate 40 MG/ML Outcome: tolerated well, no immediate complications Procedure, treatment alternatives, risks and benefits explained, specific risks discussed. Consent was given by the patient. Immediately prior to procedure a time out was called to verify the correct patient, procedure, equipment, support staff and site/side marked as required. Patient was prepped and draped in the usual sterile fashion.    Large Joint Inj: L subacromial bursa on 03/07/2024 1:32 PM Indications: pain and diagnostic evaluation Details: 22 G 1.5 in needle  Arthrogram: No  Medications: 3 mL lidocaine  1 %; 40 mg methylPREDNISolone  acetate 40 MG/ML Outcome: tolerated well, no immediate complications Procedure, treatment alternatives, risks and benefits explained, specific risks discussed. Consent was given by the patient. Immediately prior to procedure a time out was called to verify the correct patient, procedure, equipment, support staff and site/side marked as required. Patient was prepped and draped in the  usual sterile fashion.

## 2024-04-04 ENCOUNTER — Encounter (HOSPITAL_COMMUNITY): Payer: Self-pay

## 2024-04-04 ENCOUNTER — Emergency Department (HOSPITAL_COMMUNITY)
Admission: EM | Admit: 2024-04-04 | Discharge: 2024-04-04 | Disposition: A | Discharge status: Home / Self Care | Attending: Emergency Medicine | Admitting: Emergency Medicine

## 2024-04-04 ENCOUNTER — Emergency Department (HOSPITAL_COMMUNITY)

## 2024-04-04 ENCOUNTER — Other Ambulatory Visit: Payer: Self-pay

## 2024-04-04 DIAGNOSIS — M79642 Pain in left hand: Secondary | ICD-10-CM | POA: Diagnosis not present

## 2024-04-04 DIAGNOSIS — W19XXXA Unspecified fall, initial encounter: Secondary | ICD-10-CM | POA: Diagnosis not present

## 2024-04-04 DIAGNOSIS — Z79899 Other long term (current) drug therapy: Secondary | ICD-10-CM | POA: Diagnosis not present

## 2024-04-04 DIAGNOSIS — Z7982 Long term (current) use of aspirin: Secondary | ICD-10-CM | POA: Insufficient documentation

## 2024-04-04 DIAGNOSIS — M1810 Unilateral primary osteoarthritis of first carpometacarpal joint, unspecified hand: Secondary | ICD-10-CM | POA: Diagnosis not present

## 2024-04-04 DIAGNOSIS — R42 Dizziness and giddiness: Secondary | ICD-10-CM | POA: Insufficient documentation

## 2024-04-04 DIAGNOSIS — S199XXA Unspecified injury of neck, initial encounter: Secondary | ICD-10-CM | POA: Diagnosis not present

## 2024-04-04 DIAGNOSIS — R9082 White matter disease, unspecified: Secondary | ICD-10-CM | POA: Insufficient documentation

## 2024-04-04 DIAGNOSIS — M19032 Primary osteoarthritis, left wrist: Secondary | ICD-10-CM | POA: Diagnosis not present

## 2024-04-04 DIAGNOSIS — S0993XA Unspecified injury of face, initial encounter: Secondary | ICD-10-CM | POA: Diagnosis not present

## 2024-04-04 DIAGNOSIS — I6529 Occlusion and stenosis of unspecified carotid artery: Secondary | ICD-10-CM | POA: Insufficient documentation

## 2024-04-04 DIAGNOSIS — I4510 Unspecified right bundle-branch block: Secondary | ICD-10-CM | POA: Diagnosis not present

## 2024-04-04 DIAGNOSIS — R040 Epistaxis: Secondary | ICD-10-CM | POA: Diagnosis not present

## 2024-04-04 DIAGNOSIS — M79643 Pain in unspecified hand: Secondary | ICD-10-CM | POA: Diagnosis not present

## 2024-04-04 DIAGNOSIS — R58 Hemorrhage, not elsewhere classified: Secondary | ICD-10-CM | POA: Diagnosis not present

## 2024-04-04 LAB — CBC WITH DIFFERENTIAL/PLATELET
Abs Immature Granulocytes: 0.02 K/uL (ref 0.00–0.07)
Basophils Absolute: 0 K/uL (ref 0.0–0.1)
Basophils Relative: 1 %
Eosinophils Absolute: 0 K/uL (ref 0.0–0.5)
Eosinophils Relative: 0 %
HCT: 36.3 % — ABNORMAL LOW (ref 39.0–52.0)
Hemoglobin: 11.9 g/dL — ABNORMAL LOW (ref 13.0–17.0)
Immature Granulocytes: 0 %
Lymphocytes Relative: 15 %
Lymphs Abs: 0.7 K/uL (ref 0.7–4.0)
MCH: 30.5 pg (ref 26.0–34.0)
MCHC: 32.8 g/dL (ref 30.0–36.0)
MCV: 93.1 fL (ref 80.0–100.0)
Monocytes Absolute: 0.3 K/uL (ref 0.1–1.0)
Monocytes Relative: 6 %
Neutro Abs: 3.8 K/uL (ref 1.7–7.7)
Neutrophils Relative %: 78 %
Platelets: 177 K/uL (ref 150–400)
RBC: 3.9 MIL/uL — ABNORMAL LOW (ref 4.22–5.81)
RDW: 15.6 % — ABNORMAL HIGH (ref 11.5–15.5)
WBC: 4.9 K/uL (ref 4.0–10.5)
nRBC: 0 % (ref 0.0–0.2)

## 2024-04-04 LAB — COMPREHENSIVE METABOLIC PANEL WITH GFR
ALT: 16 U/L (ref 0–44)
AST: 41 U/L (ref 15–41)
Albumin: 3.1 g/dL — ABNORMAL LOW (ref 3.5–5.0)
Alkaline Phosphatase: 65 U/L (ref 38–126)
Anion gap: 10 (ref 5–15)
BUN: 10 mg/dL (ref 8–23)
CO2: 25 mmol/L (ref 22–32)
Calcium: 9.6 mg/dL (ref 8.9–10.3)
Chloride: 103 mmol/L (ref 98–111)
Creatinine, Ser: 0.86 mg/dL (ref 0.61–1.24)
GFR, Estimated: >60 mL/min (ref >60)
Glucose, Bld: 94 mg/dL (ref 70–99)
Potassium: 3.7 mmol/L (ref 3.5–5.1)
Sodium: 138 mmol/L (ref 135–145)
Total Bilirubin: 0.6 mg/dL (ref 0.0–1.2)
Total Protein: 5.9 g/dL — ABNORMAL LOW (ref 6.5–8.1)

## 2024-04-04 LAB — TSH: TSH: 3.158 u[IU]/mL (ref 0.350–4.500)

## 2024-04-04 LAB — URINALYSIS, ROUTINE W REFLEX MICROSCOPIC
Bilirubin Urine: NEGATIVE
Glucose, UA: NEGATIVE mg/dL
Hgb urine dipstick: NEGATIVE
Ketones, ur: NEGATIVE mg/dL
Leukocytes,Ua: NEGATIVE
Nitrite: NEGATIVE
Protein, ur: NEGATIVE mg/dL
Specific Gravity, Urine: 1.008 (ref 1.005–1.030)
pH: 7 (ref 5.0–8.0)

## 2024-04-04 LAB — MAGNESIUM: Magnesium: 1.6 mg/dL — ABNORMAL LOW (ref 1.7–2.4)

## 2024-04-04 LAB — PROTIME-INR
INR: 1 (ref 0.8–1.2)
Prothrombin Time: 13.8 s (ref 11.4–15.2)

## 2024-04-04 LAB — T4, FREE: Free T4: 0.92 ng/dL (ref 0.61–1.12)

## 2024-04-04 MED ORDER — MECLIZINE HCL 25 MG PO TABS
12.5000 mg | ORAL_TABLET | Freq: Once | ORAL | Status: AC
  Administered 2024-04-04: 12.5 mg via ORAL
  Filled 2024-04-04: qty 1

## 2024-04-04 MED ORDER — MAGNESIUM SULFATE 2 GM/50ML IV SOLN
2.0000 g | Freq: Once | INTRAVENOUS | Status: AC
  Administered 2024-04-04: 2 g via INTRAVENOUS
  Filled 2024-04-04: qty 50

## 2024-04-04 MED ORDER — MECLIZINE HCL 12.5 MG PO TABS: 12.5000 mg | ORAL_TABLET | Freq: Three times a day (TID) | ORAL | 0 refills | Status: DC | PRN

## 2024-04-04 NOTE — ED Notes (Signed)
 Pt felt dizzy getting up to sit on the side of the bed. Then tried to stand with a walker and was dizzy and unstable. Pt states to give him 5 minutes and then try again

## 2024-04-04 NOTE — ED Notes (Addendum)
 Pt was not stable standing and was feeling dizzy. After getting in bed, pt threw up.

## 2024-04-04 NOTE — ED Notes (Signed)
 Patient ambulated approx. 15 feet with minimal assistance. No dizziness or nausea.

## 2024-04-04 NOTE — ED Triage Notes (Signed)
 Pt BIB [facility] c/o getting up from bed, falling face forward onto the floor and hitting his anterior face/nose causing a nose bleed. Bleeding stopped shortly after the fall. Pt not on thinners. Pt was able to get back into bed and c/o still feeling like he is dizzy/room is spinning. PMHx of vertigo a long time ago. Pt A/Ox4 and vitally stable.   97 on RO 60 136/70 17 BGL 93

## 2024-04-04 NOTE — Discharge Instructions (Signed)
 Today's evaluation has been generally reassuring.  However, with your dizziness, and fall it is important to follow-up with your primary care physician.  Do not hesitate to return here for concerning changes in your condition.

## 2024-04-04 NOTE — ED Provider Notes (Signed)
 [place] EMERGENCY DEPARTMENT AT [facility] Provider Note   CSN: [id number] Arrival date & time: 04/04/24  9294     Patient presents with: Fall and Dizziness   Colin Larson is a 89 y.o. male.   HPI Adult male arrives via EMS after a fall.  No recent medication changes, or notable events.  He was in his usual state of health, when after awakening, standing, he fell.  He complains of some dizziness, notes a history of vertigo.  Since the fall he has had pain in his nose, both dorsal distal hands but no weakness anywhere, no hip pain, chest pain, abdominal pain.  Per EMS vital signs unremarkable.    Prior to Admission medications   Medication Sig Start Date End Date Taking? Authorizing Provider  acetaminophen  (TYLENOL ) 500 MG tablet Take 1,000 mg by mouth every 6 (six) hours as needed for mild pain or moderate pain.    [provider]  aspirin  EC 81 MG tablet Take 81 mg by mouth daily.    [provider]  atorvastatin  (LIPITOR ) 80 MG tablet Take 1 tablet (80 mg total) by mouth daily. 02/07/21   Dann Candyce RAMAN, MD  balsalazide (COLAZAL ) 750 MG capsule Take 2 capsules by mouth 2 (two) times daily. 08/12/17   [provider]  donepezil (ARICEPT) 5 MG tablet Take 5 mg by mouth at bedtime. 09/09/20   [provider]  levothyroxine (SYNTHROID) 50 MCG tablet Take 50 mcg by mouth daily before breakfast.    [provider]  loperamide (IMODIUM A-D) 2 MG tablet 1 tablet as needed Orally Four times a day    [provider]  Magnesium  400 MG CAPS Take 400 mg by mouth daily. 01/29/23   Hildegard, Amjad, PA-C  nitroGLYCERIN  (NITROSTAT ) 0.4 MG SL tablet Place 1 tablet (0.4 mg total) under the tongue every 5 (five) minutes as needed for chest pain. 04/27/23 07/26/23  [place], Peter M, MD  omeprazole (PRILOSEC) 20 MG capsule Take 20 mg by mouth 3 (three) times a week. 08/12/17   [provider]  ramipril  (ALTACE ) 5 MG capsule Take 1  capsule (5 mg total) by mouth daily. 02/07/21   Dann Candyce RAMAN, MD    Allergies: Bee venom    Review of Systems  Updated Vital Signs BP (!) 150/123   Pulse 67   Temp 97.8 F (36.6 C) (Oral)   Resp 15   Ht 1.651 m (5' 5)   Wt 57.2 kg   SpO2 100%   BMI 20.98 kg/m   Physical Exam Vitals and nursing note reviewed.  Constitutional:      General: He is not in acute distress.    Appearance: He is well-developed.  HENT:     Head: Normocephalic.   Eyes:     Conjunctiva/sclera: Conjunctivae normal.  Neck:   Cardiovascular:     Rate and Rhythm: Normal rate and regular rhythm.  Pulmonary:     Effort: Pulmonary effort is normal. No respiratory distress.     Breath sounds: No stridor.  Abdominal:     General: There is no distension.  Skin:    General: Skin is warm and dry.  Neurological:     General: No focal deficit present.     Mental Status: He is alert and oriented to person, place, and time.     Motor: No tremor or abnormal muscle tone.  Psychiatric:        Cognition and Memory: Cognition is impaired. Memory  is impaired.     (all labs ordered are listed, but only abnormal results are displayed) Labs Reviewed  CBC WITH DIFFERENTIAL/PLATELET  PROTIME-INR  COMPREHENSIVE METABOLIC PANEL WITH GFR  URINALYSIS, ROUTINE W REFLEX MICROSCOPIC  TSH  MAGNESIUM   T4, FREE    EKG: EKG Interpretation Date/Time:  Monday April 04 2024 07:17:13 EDT Ventricular Rate:  57 PR Interval:  188 QRS Duration:  158 QT Interval:  442 QTC Calculation: 431 R Axis:   15  Text Interpretation: Sinus rhythm Right bundle branch block  Confirmed by [name] [phone]) on 04/04/2024 7:29:53 AM  Radiology: No results found.   Procedures   Medications Ordered in the ED - No data to display                                  Medical Decision Making Elderly gentleman presents after fall with ongoing dizziness.  Unclear if patient was actually dizzy before falling or if  this occurred after standing up from bed with concern for orthostasis.  Broad differential including vertigo, hypovolemia, electrolyte abnormalities arrhythmia with consideration of post fall trauma intracranial, neck, face, hands. Cardiac 65 sinus normal pulse ox 100% room air normal  Amount and/or Complexity of Data Reviewed Independent Historian: EMS    Details: EMS rhythm strip with sinus rhythm, 63, substantial artifact. External Data Reviewed: notes.    Details: Prior ECG with low amplitude P waves sinus rhythm noted Labs: ordered. Decision-making details documented in ED Course. Radiology: ordered and independent interpretation performed. Decision-making details documented in ED Course. ECG/medicine tests: ordered and independent interpretation performed. Decision-making details documented in ED Course.  Risk Prescription drug management. Decision regarding hospitalization. Diagnosis or treatment significantly limited by social determinants of health.   Update: Patient accompanied by his wife, son at bedside.  Update: After initial attempt at ambulating was unsuccessful, the patient is not ambulatory, without dizziness.  I reviewed all findings again with the patient, wife, labs reassuring, vitals reassuring, he has had repletion of his magnesium  to normal limits, x-rays reviewed, unremarkable, head CT without acute findings, stroke.  Patient will follow-up with primary care.  Final diagnoses:  Fall, initial encounter  Dizziness    ED Discharge Orders     None          [name], MD 04/04/24 1544

## 2024-04-13 DIAGNOSIS — I119 Hypertensive heart disease without heart failure: Secondary | ICD-10-CM | POA: Diagnosis not present

## 2024-04-13 DIAGNOSIS — K5000 Crohn's disease of small intestine without complications: Secondary | ICD-10-CM | POA: Diagnosis not present

## 2024-04-13 DIAGNOSIS — Z23 Encounter for immunization: Secondary | ICD-10-CM | POA: Diagnosis not present

## 2024-04-13 DIAGNOSIS — E782 Mixed hyperlipidemia: Secondary | ICD-10-CM | POA: Diagnosis not present

## 2024-04-13 DIAGNOSIS — R42 Dizziness and giddiness: Secondary | ICD-10-CM | POA: Diagnosis not present

## 2024-04-13 DIAGNOSIS — R4189 Other symptoms and signs involving cognitive functions and awareness: Secondary | ICD-10-CM | POA: Diagnosis not present

## 2024-04-13 DIAGNOSIS — E039 Hypothyroidism, unspecified: Secondary | ICD-10-CM | POA: Diagnosis not present

## 2024-04-22 ENCOUNTER — Ambulatory Visit
Admission: EM | Admit: 2024-04-22 | Discharge: 2024-04-22 | Disposition: A | Discharge status: Home / Self Care | Attending: Family Medicine | Admitting: Family Medicine

## 2024-04-22 ENCOUNTER — Inpatient Hospital Stay (HOSPITAL_COMMUNITY)
Admission: EM | Admit: 2024-04-22 | Discharge: 2024-04-26 | DRG: 871 | Disposition: A | Discharge status: Home Health | Source: Other Acute Inpatient Hospital | Attending: Internal Medicine | Admitting: Internal Medicine

## 2024-04-22 ENCOUNTER — Emergency Department (HOSPITAL_COMMUNITY)

## 2024-04-22 ENCOUNTER — Encounter: Payer: Self-pay | Admitting: *Deleted

## 2024-04-22 ENCOUNTER — Encounter (HOSPITAL_COMMUNITY): Payer: Self-pay

## 2024-04-22 ENCOUNTER — Other Ambulatory Visit: Payer: Self-pay

## 2024-04-22 DIAGNOSIS — Z85828 Personal history of other malignant neoplasm of skin: Secondary | ICD-10-CM

## 2024-04-22 DIAGNOSIS — I2510 Atherosclerotic heart disease of native coronary artery without angina pectoris: Secondary | ICD-10-CM | POA: Diagnosis present

## 2024-04-22 DIAGNOSIS — A419 Sepsis, unspecified organism: Principal | ICD-10-CM | POA: Diagnosis present

## 2024-04-22 DIAGNOSIS — E538 Deficiency of other specified B group vitamins: Secondary | ICD-10-CM | POA: Diagnosis present

## 2024-04-22 DIAGNOSIS — R918 Other nonspecific abnormal finding of lung field: Secondary | ICD-10-CM | POA: Diagnosis not present

## 2024-04-22 DIAGNOSIS — R6521 Severe sepsis with septic shock: Secondary | ICD-10-CM | POA: Diagnosis present

## 2024-04-22 DIAGNOSIS — R0902 Hypoxemia: Secondary | ICD-10-CM | POA: Diagnosis present

## 2024-04-22 DIAGNOSIS — E860 Dehydration: Secondary | ICD-10-CM | POA: Diagnosis present

## 2024-04-22 DIAGNOSIS — N179 Acute kidney failure, unspecified: Secondary | ICD-10-CM

## 2024-04-22 DIAGNOSIS — I350 Nonrheumatic aortic (valve) stenosis: Secondary | ICD-10-CM | POA: Diagnosis present

## 2024-04-22 DIAGNOSIS — Z85820 Personal history of malignant melanoma of skin: Secondary | ICD-10-CM

## 2024-04-22 DIAGNOSIS — R579 Shock, unspecified: Secondary | ICD-10-CM | POA: Diagnosis present

## 2024-04-22 DIAGNOSIS — Z7982 Long term (current) use of aspirin: Secondary | ICD-10-CM | POA: Diagnosis not present

## 2024-04-22 DIAGNOSIS — K5090 Crohn's disease, unspecified, without complications: Secondary | ICD-10-CM | POA: Diagnosis present

## 2024-04-22 DIAGNOSIS — I252 Old myocardial infarction: Secondary | ICD-10-CM

## 2024-04-22 DIAGNOSIS — Z23 Encounter for immunization: Secondary | ICD-10-CM | POA: Diagnosis not present

## 2024-04-22 DIAGNOSIS — E039 Hypothyroidism, unspecified: Secondary | ICD-10-CM | POA: Diagnosis present

## 2024-04-22 DIAGNOSIS — I4510 Unspecified right bundle-branch block: Secondary | ICD-10-CM | POA: Diagnosis present

## 2024-04-22 DIAGNOSIS — E782 Mixed hyperlipidemia: Secondary | ICD-10-CM | POA: Diagnosis present

## 2024-04-22 DIAGNOSIS — E871 Hypo-osmolality and hyponatremia: Secondary | ICD-10-CM | POA: Diagnosis present

## 2024-04-22 DIAGNOSIS — Z1152 Encounter for screening for COVID-19: Secondary | ICD-10-CM

## 2024-04-22 DIAGNOSIS — E872 Acidosis, unspecified: Secondary | ICD-10-CM | POA: Diagnosis present

## 2024-04-22 DIAGNOSIS — Z91030 Bee allergy status: Secondary | ICD-10-CM

## 2024-04-22 DIAGNOSIS — R6520 Severe sepsis without septic shock: Secondary | ICD-10-CM | POA: Diagnosis not present

## 2024-04-22 DIAGNOSIS — I10 Essential (primary) hypertension: Secondary | ICD-10-CM | POA: Diagnosis present

## 2024-04-22 DIAGNOSIS — Z79890 Hormone replacement therapy (postmenopausal): Secondary | ICD-10-CM

## 2024-04-22 DIAGNOSIS — K219 Gastro-esophageal reflux disease without esophagitis: Secondary | ICD-10-CM | POA: Diagnosis present

## 2024-04-22 DIAGNOSIS — E861 Hypovolemia: Secondary | ICD-10-CM | POA: Diagnosis not present

## 2024-04-22 DIAGNOSIS — J189 Pneumonia, unspecified organism: Principal | ICD-10-CM | POA: Diagnosis present

## 2024-04-22 DIAGNOSIS — D649 Anemia, unspecified: Secondary | ICD-10-CM | POA: Diagnosis present

## 2024-04-22 DIAGNOSIS — Z809 Family history of malignant neoplasm, unspecified: Secondary | ICD-10-CM

## 2024-04-22 DIAGNOSIS — Z955 Presence of coronary angioplasty implant and graft: Secondary | ICD-10-CM

## 2024-04-22 DIAGNOSIS — Z8616 Personal history of COVID-19: Secondary | ICD-10-CM | POA: Diagnosis not present

## 2024-04-22 DIAGNOSIS — Z87891 Personal history of nicotine dependence: Secondary | ICD-10-CM

## 2024-04-22 DIAGNOSIS — R000 Tachycardia, unspecified: Secondary | ICD-10-CM | POA: Diagnosis not present

## 2024-04-22 DIAGNOSIS — Z79899 Other long term (current) drug therapy: Secondary | ICD-10-CM

## 2024-04-22 DIAGNOSIS — Z823 Family history of stroke: Secondary | ICD-10-CM

## 2024-04-22 HISTORY — DX: Dizziness and giddiness: R42

## 2024-04-22 LAB — CBC WITH DIFFERENTIAL/PLATELET
Basophils Absolute: 0 K/uL (ref 0.0–0.1)
Basophils Relative: 0 %
Eosinophils Absolute: 0 K/uL (ref 0.0–0.5)
Eosinophils Relative: 0 %
HCT: 37 % — ABNORMAL LOW (ref 39.0–52.0)
Hemoglobin: 12.1 g/dL — ABNORMAL LOW (ref 13.0–17.0)
Lymphocytes Relative: 5 %
Lymphs Abs: 1 K/uL (ref 0.7–4.0)
MCH: 30.6 pg (ref 26.0–34.0)
MCHC: 32.7 g/dL (ref 30.0–36.0)
MCV: 93.4 fL (ref 80.0–100.0)
Monocytes Absolute: 0.6 K/uL (ref 0.1–1.0)
Monocytes Relative: 3 %
Neutro Abs: 17.8 K/uL — ABNORMAL HIGH (ref 1.7–7.7)
Neutrophils Relative %: 92 %
Platelets: 208 K/uL (ref 150–400)
RBC: 3.96 MIL/uL — ABNORMAL LOW (ref 4.22–5.81)
RDW: 14.4 % (ref 11.5–15.5)
WBC: 19.3 K/uL — ABNORMAL HIGH (ref 4.0–10.5)
nRBC: 0 % (ref 0.0–0.2)

## 2024-04-22 LAB — COMPREHENSIVE METABOLIC PANEL WITH GFR
ALT: 21 U/L (ref 0–44)
AST: 92 U/L — ABNORMAL HIGH (ref 15–41)
Albumin: 3.1 g/dL — ABNORMAL LOW (ref 3.5–5.0)
Alkaline Phosphatase: 48 U/L (ref 38–126)
Anion gap: 14 (ref 5–15)
BUN: 19 mg/dL (ref 8–23)
CO2: 23 mmol/L (ref 22–32)
Calcium: 9.3 mg/dL (ref 8.9–10.3)
Chloride: 100 mmol/L (ref 98–111)
Creatinine, Ser: 1.78 mg/dL — ABNORMAL HIGH (ref 0.61–1.24)
GFR, Estimated: 36 mL/min — ABNORMAL LOW (ref >60)
Glucose, Bld: 97 mg/dL (ref 70–99)
Potassium: 4.2 mmol/L (ref 3.5–5.1)
Sodium: 137 mmol/L (ref 135–145)
Total Bilirubin: 0.9 mg/dL (ref 0.0–1.2)
Total Protein: 6 g/dL — ABNORMAL LOW (ref 6.5–8.1)

## 2024-04-22 LAB — PROTIME-INR
INR: 1.1 (ref 0.8–1.2)
Prothrombin Time: 14.6 s (ref 11.4–15.2)

## 2024-04-22 LAB — RESP PANEL BY RT-PCR (RSV, FLU A&B, COVID)  RVPGX2
Influenza A by PCR: NEGATIVE
Influenza B by PCR: NEGATIVE
Resp Syncytial Virus by PCR: NEGATIVE
SARS Coronavirus 2 by RT PCR: NEGATIVE

## 2024-04-22 LAB — I-STAT CHEM 8, ED
BUN: 20 mg/dL (ref 8–23)
Calcium, Ion: 1.29 mmol/L (ref 1.15–1.40)
Chloride: 99 mmol/L (ref 98–111)
Creatinine, Ser: 1.7 mg/dL — ABNORMAL HIGH (ref 0.61–1.24)
Glucose, Bld: 87 mg/dL (ref 70–99)
HCT: 36 % — ABNORMAL LOW (ref 39.0–52.0)
Hemoglobin: 12.2 g/dL — ABNORMAL LOW (ref 13.0–17.0)
Potassium: 4.1 mmol/L (ref 3.5–5.1)
Sodium: 137 mmol/L (ref 135–145)
TCO2: 24 mmol/L (ref 22–32)

## 2024-04-22 LAB — URINALYSIS, W/ REFLEX TO CULTURE (INFECTION SUSPECTED)
Bilirubin Urine: NEGATIVE
Glucose, UA: NEGATIVE mg/dL
Hgb urine dipstick: NEGATIVE
Ketones, ur: NEGATIVE mg/dL
Nitrite: NEGATIVE
Protein, ur: 30 mg/dL — AB
Specific Gravity, Urine: 1.025 (ref 1.005–1.030)
pH: 5 (ref 5.0–8.0)

## 2024-04-22 LAB — LACTIC ACID, PLASMA
Lactic Acid, Venous: 2.2 mmol/L (ref 0.5–1.9)
Lactic Acid, Venous: 2.4 mmol/L (ref 0.5–1.9)

## 2024-04-22 LAB — I-STAT CG4 LACTIC ACID, ED
Lactic Acid, Venous: 4.2 mmol/L (ref 0.5–1.9)
Lactic Acid, Venous: 4.1 mmol/L (ref 0.5–1.9)
Lactic Acid, Venous: 2.1 mmol/L (ref 0.5–1.9)

## 2024-04-22 LAB — PROCALCITONIN: Procalcitonin: 30.37 ng/mL

## 2024-04-22 LAB — MRSA NEXT GEN BY PCR, NASAL: MRSA by PCR Next Gen: NOT DETECTED

## 2024-04-22 LAB — TSH: TSH: 2.015 u[IU]/mL (ref 0.350–4.500)

## 2024-04-22 MED ORDER — NOREPINEPHRINE 4 MG/250ML-% IV SOLN
0.0000 ug/min | INTRAVENOUS | Status: DC
  Administered 2024-04-22: 2 ug/min via INTRAVENOUS
  Filled 2024-04-22: qty 250

## 2024-04-22 MED ORDER — LOPERAMIDE HCL 2 MG PO CAPS
2.0000 mg | ORAL_CAPSULE | Freq: Every day | ORAL | Status: DC
  Administered 2024-04-22 – 2024-04-26 (×5): 2 mg via ORAL
  Filled 2024-04-22 (×5): qty 1

## 2024-04-22 MED ORDER — SODIUM CHLORIDE 0.9 % IV BOLUS: 1000.0000 mL | Freq: Once | INTRAVENOUS | Status: DC

## 2024-04-22 MED ORDER — HEPARIN SODIUM (PORCINE) 5000 UNIT/ML IJ SOLN
5000.0000 [IU] | Freq: Three times a day (TID) | INTRAMUSCULAR | Status: DC
  Administered 2024-04-22 – 2024-04-23 (×4): 5000 [IU] via SUBCUTANEOUS
  Filled 2024-04-22 (×5): qty 1

## 2024-04-22 MED ORDER — SODIUM CHLORIDE 0.9 % IV SOLN: 1.0000 g | Freq: Once | INTRAVENOUS | Status: DC

## 2024-04-22 MED ORDER — PANTOPRAZOLE SODIUM 40 MG PO TBEC
40.0000 mg | DELAYED_RELEASE_TABLET | Freq: Every day | ORAL | Status: DC
  Administered 2024-04-22 – 2024-04-26 (×5): 40 mg via ORAL
  Filled 2024-04-22 (×5): qty 1

## 2024-04-22 MED ORDER — POLYETHYLENE GLYCOL 3350 17 G PO PACK: 17.0000 g | PACK | Freq: Every day | ORAL | Status: DC | PRN

## 2024-04-22 MED ORDER — ATORVASTATIN CALCIUM 80 MG PO TABS
80.0000 mg | ORAL_TABLET | Freq: Every day | ORAL | Status: DC
  Administered 2024-04-23 – 2024-04-26 (×4): 80 mg via ORAL
  Filled 2024-04-22 (×4): qty 1

## 2024-04-22 MED ORDER — SODIUM CHLORIDE 0.9 % IV SOLN
500.0000 mg | INTRAVENOUS | Status: DC
  Administered 2024-04-23 – 2024-04-24 (×2): 500 mg via INTRAVENOUS
  Filled 2024-04-22 (×2): qty 5

## 2024-04-22 MED ORDER — SODIUM CHLORIDE 0.9 % IV BOLUS
750.0000 mL | Freq: Once | INTRAVENOUS | Status: AC
  Administered 2024-04-22: 750 mL via INTRAVENOUS

## 2024-04-22 MED ORDER — LEVOTHYROXINE SODIUM 50 MCG PO TABS
50.0000 ug | ORAL_TABLET | Freq: Every day | ORAL | Status: DC
  Administered 2024-04-23 – 2024-04-26 (×4): 50 ug via ORAL
  Filled 2024-04-22 (×4): qty 1

## 2024-04-22 MED ORDER — CHLORHEXIDINE GLUCONATE CLOTH 2 % EX PADS
6.0000 | MEDICATED_PAD | Freq: Every day | CUTANEOUS | Status: DC
  Administered 2024-04-22 – 2024-04-23 (×2): 6 via TOPICAL

## 2024-04-22 MED ORDER — LACTATED RINGERS IV BOLUS (SEPSIS)
1000.0000 mL | Freq: Once | INTRAVENOUS | Status: AC
  Administered 2024-04-22: 1000 mL via INTRAVENOUS

## 2024-04-22 MED ORDER — LACTATED RINGERS IV SOLN: INTRAVENOUS | Status: DC

## 2024-04-22 MED ORDER — BALSALAZIDE DISODIUM 750 MG PO CAPS: 2250.0000 mg | ORAL_CAPSULE | Freq: Two times a day (BID) | ORAL | Status: DC

## 2024-04-22 MED ORDER — MECLIZINE HCL 12.5 MG PO TABS: 12.5000 mg | ORAL_TABLET | Freq: Three times a day (TID) | ORAL | Status: DC | PRN

## 2024-04-22 MED ORDER — ASPIRIN 81 MG PO TBEC
81.0000 mg | DELAYED_RELEASE_TABLET | Freq: Every day | ORAL | Status: DC
  Administered 2024-04-22 – 2024-04-26 (×5): 81 mg via ORAL
  Filled 2024-04-22 (×5): qty 1

## 2024-04-22 MED ORDER — SODIUM CHLORIDE 0.9 % IV SOLN: 250.0000 mL | INTRAVENOUS | Status: AC

## 2024-04-22 MED ORDER — SODIUM CHLORIDE 0.9 % IV BOLUS
500.0000 mL | Freq: Once | INTRAVENOUS | Status: AC
  Administered 2024-04-22: 500 mL via INTRAVENOUS

## 2024-04-22 MED ORDER — DOCUSATE SODIUM 100 MG PO CAPS: 100.0000 mg | ORAL_CAPSULE | Freq: Two times a day (BID) | ORAL | Status: DC | PRN

## 2024-04-22 MED ORDER — DONEPEZIL HCL 10 MG PO TABS
5.0000 mg | ORAL_TABLET | Freq: Every day | ORAL | Status: DC
  Administered 2024-04-22 – 2024-04-25 (×4): 5 mg via ORAL
  Filled 2024-04-22 (×4): qty 1

## 2024-04-22 MED ORDER — SODIUM CHLORIDE 0.9 % IV SOLN
500.0000 mg | Freq: Once | INTRAVENOUS | Status: AC
  Administered 2024-04-22: 500 mg via INTRAVENOUS
  Filled 2024-04-22: qty 5

## 2024-04-22 MED ORDER — ORAL CARE MOUTH RINSE: 15.0000 mL | OROMUCOSAL | Status: DC | PRN

## 2024-04-22 MED ORDER — SODIUM CHLORIDE 0.9 % IV SOLN
2.0000 g | Freq: Once | INTRAVENOUS | Status: AC
  Administered 2024-04-22: 2 g via INTRAVENOUS
  Filled 2024-04-22: qty 20

## 2024-04-22 MED ORDER — SODIUM CHLORIDE 0.9 % IV SOLN
2.0000 g | INTRAVENOUS | Status: DC
  Administered 2024-04-23 – 2024-04-25 (×3): 2 g via INTRAVENOUS
  Filled 2024-04-22 (×3): qty 20

## 2024-04-22 NOTE — ED Notes (Signed)
Pt attempted to urinate unable to go at this time.

## 2024-04-22 NOTE — ED Notes (Signed)
 CCMD called.

## 2024-04-22 NOTE — ED Provider Triage Note (Signed)
 Emergency Medicine Provider Triage Evaluation Note  Colin Larson , a 89 y.o. male  was evaluated in triage.  Pt complains of shaking chills and weakness.  Had fever up to 101.7 at home.  Found to be moderately hypotensive.  Sent from urgent care for evaluation.  No other complaints other than swimmy headed and weak  Review of Systems  Positive: Hypertension and fevers Negative: Vomiting  Physical Exam  BP (!) 77/54 (BP Location: Right Arm)   Pulse 84   Temp 97.9 F (36.6 C) (Oral)   Resp 20   SpO2 94%  Gen:   Awake, no distress   Resp:  Normal effort  MSK:   Moves extremities without difficulty  Other:    Medical Decision Making  Medically screening exam initiated at 2:37 PM.  Appropriate orders placed.  Colin Larson was informed that the remainder of the evaluation will be completed by another provider, this initial triage assessment does not replace that evaluation, and the importance of remaining in the ED until their evaluation is complete.  Patient needs immediate bed.  Sepsis labs ordered   Colin Chroman, PA-C 04/22/24 1606

## 2024-04-22 NOTE — ED Triage Notes (Addendum)
 Pt states he started feeling very cold last night and was spitting up green stuff [unclear from where]. Per wife's note (son with pt): pt got up @ 0230 this AM shaking and dropping things; fever 101.7; had Tyl. [Pt stated] 'room was spinning last night' .  Had Tyl approx 30 min ago. Pt denies cough or pain. Denies any nausea at present. Pt and son state pt was hospitalized a few wks ago and diagnosed with vertigo.

## 2024-04-22 NOTE — ED Provider Notes (Addendum)
 [facility]    CSN: [id number] Arrival date & time: 04/22/24  1322      History   Chief Complaint Chief Complaint  Patient presents with   Fever   Dizziness    HPI Colin Larson is a 89 y.o. male.    Fever Dizziness Here for fever and dizziness/weakness. Began bothering him overnight and has had a hard time standing. No cough. Has had tylenol  today.  NKDA  Was at ER 2 weeks ago for dizziness/vertigo.  Past Medical History:  Diagnosis Date   Anemia    Arthritis    Carotid artery disease    Coronary artery disease    a. MI 2000 s/p BMS to prox/mid RCA, 70% lesion in abberant LCx that arises from right coronary cusp per Dr. Johnice note   Crohn disease [facility])    GI bleeding    2014 - from diverticulosis/Crohn's 2014   Hiatal hernia    no surgery   Hypercholesteremia    Hypertension    Melanoma (HCC) 01/07/2012   MI (myocardial infarction) (HCC) 05/31/1999   Mild aortic stenosis    Mild mitral regurgitation    Skin cancer    Vertigo     Patient Active Problem List   Diagnosis Date Noted   Shock (HCC) 04/22/2024   COVID-19 virus infection 12/02/2020   Hyponatremia 12/02/2020   Lactic acidosis 12/02/2020   Bradycardia 12/01/2017   Dizziness 11/30/2017   Elevated LFTs 08/24/2017   Unilateral primary osteoarthritis, right knee 02/25/2017   Chronic pain of right knee 02/25/2017   Unilateral primary osteoarthritis, left knee 02/25/2017   History of anemia 07/30/2016   Carotid artery disease    GI bleeding    Mild aortic stenosis    Mild mitral regurgitation    Mixed hyperlipidemia 05/04/2013   GERD (gastroesophageal reflux disease) 05/04/2013   Diverticulosis of colon with hemorrhage 05/04/2013   Coronary artery disease involving native coronary artery of native heart without angina pectoris 05/04/2013   Crohn disease (HCC) 05/04/2013   Melanoma (HCC) 01/07/2012   Essential hypertension 12/29/2011    Past Surgical History:   Procedure Laterality Date   CHOLECYSTECTOMY     CORONARY STENT PLACEMENT  [id number]   INGUINAL HERNIA REPAIR     x 2       Home Medications    Prior to Admission medications   Medication Sig Start Date End Date Taking? Authorizing Provider  aspirin  EC 81 MG tablet Take 81 mg by mouth daily.   Yes [provider]  atorvastatin  (LIPITOR ) 80 MG tablet Take 1 tablet (80 mg total) by mouth daily. 02/07/21  Yes Dann Candyce RAMAN, MD  balsalazide (COLAZAL ) 750 MG capsule Take 3 capsules by mouth in the morning and at bedtime. 08/12/17  Yes [provider]  donepezil (ARICEPT) 5 MG tablet Take 5 mg by mouth at bedtime. 09/09/20  Yes [provider]  levothyroxine (SYNTHROID) 50 MCG tablet Take 50 mcg by mouth daily before breakfast.   Yes [provider]  loperamide (IMODIUM A-D) 2 MG tablet Takes one daily   Yes [provider]  meclizine (ANTIVERT) 12.5 MG tablet Take 1 tablet (12.5 mg total) by mouth 3 (three) times daily as needed for dizziness. 04/04/24  Yes [name], MD  Multiple Vitamin (MULTIVITAMIN PO) Take by mouth.   Yes [provider]  omeprazole (PRILOSEC) 20 MG capsule Take 20 mg by mouth 3 (three) times a week. 08/12/17  Yes [provider]  ramipril  (ALTACE ) 5 MG capsule Take 1 capsule (5 mg total) by mouth daily. 02/07/21  Yes Dann Candyce RAMAN, MD  acetaminophen  (TYLENOL ) 500 MG tablet Take 1,000 mg by mouth every 6 (six) hours as needed for mild pain or moderate pain.    [provider]  Magnesium  400 MG CAPS Take 400 mg by mouth daily. 01/29/23   Hildegard, Amjad, PA-C  nitroGLYCERIN  (NITROSTAT ) 0.4 MG SL tablet Place 1 tablet (0.4 mg total) under the tongue every 5 (five) minutes as needed for chest pain. 04/27/23 07/26/23  Jordan, Peter M, MD    Family History Family History  Problem Relation Age of Onset   Cancer Mother    Stroke Father     Social History Social History   Tobacco Use    Smoking status: Former    Current packs/day: 0.00    Average packs/day: 0.1 packs/day for 5.0 years (0.7 ttl pk-yrs)    Types: Cigarettes    Start date: 02/14/1970    Quit date: 02/15/1975    Years since quitting: 49.2   Smokeless tobacco: Never  Substance Use Topics   Alcohol use: No     Allergies   Bee venom   Review of Systems Review of Systems  Constitutional:  Positive for fever.  Neurological:  Positive for dizziness.     Physical Exam Triage Vital Signs ED Triage Vitals  Encounter Vitals Group     BP 04/22/24 1331 (!) 94/57     Girls Systolic BP Percentile --      Girls Diastolic BP Percentile --      Boys Systolic BP Percentile --      Boys Diastolic BP Percentile --      Pulse Rate 04/22/24 1331 94     Resp 04/22/24 1331 20     Temp 04/22/24 1331 98 F (36.7 C)     Temp Source 04/22/24 1331 Oral     SpO2 04/22/24 1331 92 %     Weight --      Height --      Head Circumference --      Peak Flow --      Pain Score 04/22/24 1334 0     Pain Loc --      Pain Education --      Exclude from Growth Chart --    No data found.  Updated Vital Signs BP (!) 94/57   Pulse 94   Temp 98 F (36.7 C) (Oral)   Resp 20   SpO2 92%   Visual Acuity Right Eye Distance:   Left Eye Distance:   Bilateral Distance:    Right Eye Near:   Left Eye Near:    Bilateral Near:     Physical Exam Vitals reviewed.  Constitutional:      General: He is not in acute distress.    Appearance: He is not toxic-appearing.     Comments: Chronically ill-appearing  Cardiovascular:     Rate and Rhythm: Normal rate and regular rhythm.  Pulmonary:     Effort: Pulmonary effort is normal.     Breath sounds: Normal breath sounds.  Neurological:     Mental Status: He is alert and oriented to person, place, and time.  Psychiatric:        Behavior: Behavior normal.      UC Treatments / Results  Labs (all labs ordered are listed, but only abnormal results are displayed) Labs Reviewed  - No data to display  EKG  Radiology DG Chest Port 1 View Result Date: 04/22/2024 EXAM: 1 VIEW(S) XRAY OF THE CHEST 04/22/2024 05:35:24 PM COMPARISON: Chest x-ray 01/29/2023. CLINICAL HISTORY: Questionable sepsis - evaluate for abnormality. FINDINGS: LUNGS AND PLEURA: There are patchy right lower lobe airspace opacities worrisome for pneumonia. No pulmonary edema. No pleural effusion. No pneumothorax. HEART AND MEDIASTINUM: No acute abnormality of the cardiac and mediastinal silhouettes. BONES AND SOFT TISSUES: No acute osseous abnormality. IMPRESSION: 1. Patchy right lower lobe airspace opacities, worrisome for pneumonia. Electronically signed by: Greig Pique MD 04/22/2024 06:06 PM EST RP Workstation: [id number]    Procedures Procedures (including critical care time)  Medications Ordered in UC Medications - No data to display  Initial Impression / Assessment and Plan / UC Course  I have reviewed the triage vital signs and the nursing notes.  Pertinent labs & imaging results that were available during my care of the patient were reviewed by me and considered in my medical decision making (see chart for details).       His pressure here is 94/57.  Heart rate 94.  I think he needs urgent evaluation in the emergency room.  His son is agreeable and will take him by private vehicle to the emergency room.  Final Clinical Impressions(s) / UC Diagnoses   Final diagnoses:  Hypotension due to hypovolemia     Discharge Instructions      To the ER     ED Prescriptions   None    PDMP not reviewed this encounter.   Vonna Sharlet POUR, MD 04/22/24 1349    Vonna Sharlet POUR, MD 04/23/24 [phone]

## 2024-04-22 NOTE — Progress Notes (Deleted)
 Cardiology Office Note:    Date:  04/22/2024   ID:  Colin Larson, DOB 02/02/1935, MRN [id number]  PCP:  [name], MD   [facility] Providers Cardiologist:  [name], MD     Referring MD: [name], MD   No chief complaint on file.   History of Present Illness:    Colin Larson is a 89 y.o. male seen for follow up CAD. He is a former patient of Dr Reek. He has a history of CAD (MI 2000 s/p BMS to prox/mid RCA, 70% lesion in abberant LCx that arises from right coronary cusp), carotid disease, mild AS, HTN, HLD, arthritis, Crohn's disease, hiatal hernia, GIB (from diverticulosis/Crohn's 2014), melanoma of the skin. Last seen in Feb 2023.   Patient states he is doing well. Walks daily. Has arthritis in his knees. No chest pain, dyspnea, palpitations, dizziness. Feels well.   Past Medical History:  Diagnosis Date   Anemia    Arthritis    Carotid artery disease    Coronary artery disease    a. MI 2000 s/p BMS to prox/mid RCA, 70% lesion in abberant LCx that arises from right coronary cusp per Dr. Johnice note   Crohn disease [facility])    GI bleeding    2014 - from diverticulosis/Crohn's 2014   Hiatal hernia    no surgery   Hypercholesteremia    Hypertension    Melanoma (HCC) 01/07/2012   MI (myocardial infarction) (HCC) [id number]   Mild aortic stenosis    Mild mitral regurgitation    Skin cancer     Past Surgical History:  Procedure Laterality Date   CHOLECYSTECTOMY     CORONARY STENT PLACEMENT  [id number]   INGUINAL HERNIA REPAIR     x 2    Current Medications: No outpatient medications have been marked as taking for the 04/26/24 encounter (Appointment) with Glora Hulgan M, MD.     Allergies:   Bee venom   Social History   Socioeconomic History   Marital status: Married    Spouse name: Not on file   Number of children: Not on file   Years of education: Not on file   Highest education level: Not on file  Occupational  History   Not on file  Tobacco Use   Smoking status: Former    Current packs/day: 0.00    Average packs/day: 0.1 packs/day for 5.0 years (0.7 ttl pk-yrs)    Types: Cigarettes    Start date: 02/14/1970    Quit date: 02/15/1975    Years since quitting: 49.2   Smokeless tobacco: Never  Substance and Sexual Activity   Alcohol use: No   Drug use: No   Sexual activity: Not on file  Other Topics Concern   Not on file  Social History Narrative   Not on file   Social Drivers of Health   Financial Resource Strain: Not on file  Food Insecurity: Not on file  Transportation Needs: Not on file  Physical Activity: Not on file  Stress: Not on file  Social Connections: Not on file     Family History: The patient's family history includes Cancer in his mother; Stroke in his father.  ROS:   Please see the history of present illness.     All other systems reviewed and are negative.  EKGs/Labs/Other Studies Reviewed:    The following studies were reviewed today: Echo 12/01/17: Study Conclusions   - Left ventricle: The cavity size was normal. There was moderate  concentric hypertrophy. Systolic function was vigorous. The    estimated ejection fraction was in the range of 65% to 70%. Wall    motion was normal; there were no regional wall motion    abnormalities. Doppler parameters are consistent with abnormal    left ventricular relaxation (grade 1 diastolic dysfunction). The    E/e&' ratio is >15, suggesting elevated LV filling pressure.  - Aortic valve: Mildly calcified leaflets. Mild to moderate    stenosis. Mean gradient (S): 10 mm Hg. Peak gradient (S): 27 mm    Hg. Valve area (VTI): 1.31 cm^2. Valve area (Vmax): 1.09 cm^2.    Valve area (Vmean): 1.24 cm^2.  - Mitral valve: Mildly thickened leaflets . There was mild    regurgitation.  - Left atrium: The atrium was mildly dilated.  - Inferior vena cava: The vessel was dilated. The respirophasic    diameter changes were blunted (<  50%), consistent with elevated    central venous pressure.   Holter monitor 01/07/18: Study Highlights  Normal sinus rhythm with PACs, PVCs. Short runs of SVT, up to 163 bpm, 11 beats longest duration. Average HR 71 bpm. Slowest HR 46 bpm presumably during sleep.  Carotid dopplers 08/04/22: ummary:  Right Carotid: Velocities in the right ICA are consistent with a 40-59%                 stenosis. The ECA appears >50% stenosed.   Left Carotid: Velocities in the left ICA are consistent with a 1-39%  stenosis.   Vertebrals: Bilateral vertebral arteries demonstrate antegrade flow.  Subclavians: Normal flow hemodynamics were seen in bilateral subclavian               arteries.       Recent Labs: 04/04/2024: ALT 16; BUN 10; Creatinine, Ser 0.86; Hemoglobin 11.9; Magnesium  1.6; Platelets 177; Potassium 3.7; Sodium 138; TSH 3.158  Recent Lipid Panel No results found for: CHOL, TRIG, HDL, CHOLHDL, VLDL, LDLCALC, LDLDIRECT Dated 10/10/22: cholesterol 118, triglycerides 77, HDL 34, LDL 69. CMET normal Dated 04/16/23: ALT 50. Otherwise CMET normal. TSH normal.   Risk Assessment/Calculations:      No BP recorded.  {Refresh Note OR Click here to enter BP  :1}***         Physical Exam:    VS:  There were no vitals taken for this visit.    Wt Readings from Last 3 Encounters:  04/04/24 126 lb 1.7 oz (57.2 kg)  04/27/23 126 lb 3.2 oz (57.2 kg)  07/16/22 128 lb (58.1 kg)     GEN:  Well nourished, well developed in no acute distress HEENT: Normal NECK: No JVD; bilateral carotid bruits LYMPHATICS: No lymphadenopathy CARDIAC: RRR, gr 2/6 systolic murmur at the apex, no rubs, gallops RESPIRATORY:  Clear to auscultation without rales, wheezing or rhonchi  ABDOMEN: Soft, non-tender, non-distended MUSCULOSKELETAL:  No edema; No deformity  SKIN: Warm and dry NEUROLOGIC:  Alert and oriented x 3 PSYCHIATRIC:  Normal affect   ASSESSMENT:    No diagnosis found.  PLAN:    In  order of problems listed above:  CAD: remote stenting of the RCA with BMS in 2000. 70% stenosis in anomalous LCx. No angina on medical therapy.  Continue aspirin .  High dose atorvastatin .   Carotid artery disease: 40-59% RICA disease by doppler Feb 2024.  Unchanged. Yearly follow up  Mild- moderate  AS: noted in 2019. No sx of severe AS. Exam is c/w mild AS.  Hypertensive heart disease: Controlled on  low dose ramipril .   Hyperlipidemia. LDL 69 in April on lipitor  80 mg daily.  Chronic RBBB           Medication Adjustments/Labs and Tests Ordered: Current medicines are reviewed at length with the patient today.  Concerns regarding medicines are outlined above.  No orders of the defined types were placed in this encounter.  No orders of the defined types were placed in this encounter.   There are no Patient Instructions on file for this visit.   Signed, Wilfred Siverson, MD  04/22/2024 7:25 AM    [facility]

## 2024-04-22 NOTE — Progress Notes (Addendum)
 eLink Physician-Brief Progress Note Patient Name: Colin Larson DOB: [birth year] MRN: [id number]   Date of Service  04/22/2024  HPI/Events of Note  89/M brought to the ED due to fever, chills, weakness, noted to be hypotensive despite fluid resuscitation. Pt started on empiric antibiotics for pneumonia.  Pt hypotensive despite fluid resuscitation and has been started on levophed.  Pt is awake and alert, not in distress.  BP 116/65, HR 74, RR 20, O2 sats 94% on RA on 5mcg/min of levophed.  COVID, flu, RSV negative Lactic acid 2.1 <--- 4.2 Crea 1.70 from baseline of 0.86  eICU Interventions  Continue antibiotics.  Titrate levophed to maintain MAP >65.  Continue to trend lactate.  Follow up strep and legionella Ag and respiratory viral panel. Continue IVFs.  Continue ASA, statin, synthroid, meclizine, balsalazide and PPI. Hold ACEi.   Heparin for DVT prophylaxis.      Intervention Category Evaluation Type: New Patient Evaluation  Shanda Busman 04/22/2024, 7:27 PM  10:30 PM Notified of lactic acid at 2.4 <-- 2.1. Current BP at 105/68, MAP 80. Pt is getting LR at 100cc/hr.   Plan> Continue to follow lactate.

## 2024-04-22 NOTE — ED Notes (Signed)
 Patient is being discharged from the Urgent Care and sent to the Emergency Department via private vehicle with son . Per Dr Vonna, patient is in need of higher level of care due to dizziness, fever, hypotension. Patient is aware and verbalizes understanding of plan of care.  Vitals:   04/22/24 1331  BP: (!) 94/57  Pulse: 94  Resp: 20  Temp: 98 F (36.7 C)  SpO2: 92%

## 2024-04-22 NOTE — H&P (Signed)
 NAME:  Colin Larson, MRN:  [id number], DOB:  1934/11/20, LOS: 0 ADMISSION DATE:  04/22/2024, CONSULTATION DATE:  04/22/24 REFERRING MD:  Simon CHIEF COMPLAINT:  Weakness   History of Present Illness:  Colin Larson is a 89 y.o. male who has a PMH as outlined below.  He presented to urgent care on 11/7 with fever to 101 earlier that morning, chills, weakness.  While at urgent care he was noted to be hypotensive with systolic in the 70s; therefore, he was sent to the ED for further evaluation.  He had apparently been coughing up green stuff in the preceding days.  Denied any chest pain, dyspnea, N/V/D, abdominal pain, exposures to known sick contacts, recent travel.  While in the ED, he was hypotensive on presentation again with systolics in the 90s.  He received 30 mL/kg of normal saline (1.5 L) but he remained hypotensive.  His initial lactate was 4.2 with repeat being 4.1.  CXR was concerning for right basilar infiltrate.  Due to ongoing hypotension and lactic acidosis, PCCM called for admission.   Having some BPPV that was improving, seems like fatigue and orthostasis started pretty hard yesterday.  Does have some mild cognitive impairement but usually pretty active walks 5x a week fair distances for exercise.  Baseline aBps somewhere around 100-120 per wife.  Pertinent  Medical History:  has Mixed hyperlipidemia; GERD (gastroesophageal reflux disease); Diverticulosis of colon with hemorrhage; Coronary artery disease involving native coronary artery of native heart without angina pectoris; Crohn disease (HCC); Carotid artery disease; GI bleeding; Mild aortic stenosis; Mild mitral regurgitation; History of anemia; Essential hypertension; Melanoma (HCC); Unilateral primary osteoarthritis, right knee; Chronic pain of right knee; Unilateral primary osteoarthritis, left knee; Elevated LFTs; Dizziness; Bradycardia; COVID-19 virus infection; Hyponatremia; Lactic acidosis; and Shock (HCC) on  their problem list.   Significant Hospital Events: Including procedures, antibiotic start and stop dates in addition to other pertinent events   11/7 admit  Interim History / Subjective:  Consult  Objective:  Blood pressure (!) 79/51, pulse 76, temperature 97.9 F (36.6 C), temperature source Oral, resp. rate 20, height 5' 5 (1.651 m), weight 57.2 kg, SpO2 96%.       No intake or output data in the 24 hours ending 04/22/24 1708 Filed Weights   04/22/24 1512  Weight: 57.2 kg     Physical Exam: General: no distress Neuro: moves everything to command HEENT: MMM, trachea midline Cardiovascular: irregular with what looks like PVCs on monitor, EF looks okay on POCUS Lungs: clear, no wheezing, nonlabored Abdomen: soft, +BS Musculoskeletal: arthritic changes Skin: age related changes Psych: Aox3, a little slow to answer some questions   Labs/imaging personally reviewed:  CXR 11/7 > right basilar infiltrate  Assessment & Plan:   Septic shock secondary to RLL CAP. - Empiric CTX/Azithromycin. - Follow cultures. - Add sputum culture if able. - Urine ag - COVID and flu pending, add RVP. - Continue fluids, additional 500cc bolus in ED started. Might could use an additional 500cc ontop of that. - Trend lactate. - Okay for peripheral levophed  AKI - 2/2 above + exacerbated by ACEi use. Lactic acidemia - 2/2 above. - Continue fluids.  Hx CAD, HTN, HLD, MI. - Continue PTA ASA, statin. - Hold PTA Ramipril .  Hx hypothyroidism. - Continue PTA synthroid. - Assess TSH.  Hx Vertigo. - Continue PTA Meclizine. - Check orthostatics prior to DC  Hx Crohn's. - Continue PTA Balsalazide, Loperamide.  Code status- full confirmed; overall looks pretty  good hopefully bounces back quickly  Labs   CBC: Recent Labs  Lab 04/22/24 1436 04/22/24 1552  WBC 19.3*  --   NEUTROABS 17.8*  --   HGB 12.1* 12.2*  HCT 37.0* 36.0*  MCV 93.4  --   PLT 208  --     Basic Metabolic  Panel: Recent Labs  Lab 04/22/24 1436 04/22/24 1552  NA 137 137  K 4.2 4.1  CL 100 99  CO2 23  --   GLUCOSE 97 87  BUN 19 20  CREATININE 1.78* 1.70*  CALCIUM  9.3  --    GFR: Estimated Creatinine Clearance: 24.8 mL/min (A) (by C-G formula based on SCr of 1.7 mg/dL (H)). Recent Labs  Lab 04/22/24 1436 04/22/24 1440 04/22/24 1629  WBC 19.3*  --   --   LATICACIDVEN  --  4.2* 4.1*    Liver Function Tests: Recent Labs  Lab 04/22/24 1436  AST 92*  ALT 21  ALKPHOS 48  BILITOT 0.9  PROT 6.0*  ALBUMIN 3.1*   No results for input(s): LIPASE, AMYLASE in the last 168 hours. No results for input(s): AMMONIA in the last 168 hours.  ABG    Component Value Date/Time   TCO2 24 04/22/2024 1552     Coagulation Profile: Recent Labs  Lab 04/22/24 1436  INR 1.1    Cardiac Enzymes: No results for input(s): CKTOTAL, CKMB, CKMBINDEX, TROPONINI in the last 168 hours.  HbA1C: No results found for: HGBA1C  CBG: No results for input(s): GLUCAP in the last 168 hours.  Review of Systems:    Positive Symptoms in bold:  Constitutional fevers, chills, weight loss, fatigue, anorexia, malaise  Eyes decreased vision, double vision, eye irritation  Ears, Nose, Mouth, Throat sore throat, trouble swallowing, sinus congestion  Cardiovascular chest pain, paroxysmal nocturnal dyspnea, lower ext edema, palpitations   Respiratory SOB, cough, DOE, hemoptysis, wheezing  Gastrointestinal nausea, vomiting, diarrhea  Genitourinary burning with urination, trouble urinating  Musculoskeletal joint aches, joint swelling, back pain  Integumentary  rashes, skin lesions  Neurological focal weakness, focal numbness, trouble speaking, headaches  Psychiatric depression, anxiety, confusion  Endocrine polyuria, polydipsia, cold intolerance, heat intolerance  Hematologic abnormal bruising, abnormal bleeding, unexplained nose bleeds  Allergic/Immunologic recurrent infections, hives,  swollen lymph nodes     Past Medical History:  He,  has a past medical history of Anemia, Arthritis, Carotid artery disease, Coronary artery disease, Crohn disease (HCC), GI bleeding, Hiatal hernia, Hypercholesteremia, Hypertension, Melanoma (HCC) (01/07/2012), MI (myocardial infarction) (HCC) (05/31/1999), Mild aortic stenosis, Mild mitral regurgitation, Skin cancer, and Vertigo.   Surgical History:   Past Surgical History:  Procedure Laterality Date   CHOLECYSTECTOMY     CORONARY STENT PLACEMENT  [id number]   INGUINAL HERNIA REPAIR     x 2     Social History:   reports that he quit smoking about 49 years ago. His smoking use included cigarettes. He started smoking about 54 years ago. He has a 0.7 pack-year smoking history. He has never used smokeless tobacco. He reports that he does not drink alcohol.   Family History:  His family history includes Cancer in his mother; Stroke in his father.   Allergies Allergies  Allergen Reactions   Bee Venom Anaphylaxis and Swelling     Home Medications  Prior to Admission medications   Medication Sig Start Date End Date Taking? Authorizing Provider  acetaminophen  (TYLENOL ) 500 MG tablet Take 1,000 mg by mouth every 6 (six) hours as needed for mild pain or  moderate pain.    [provider]  aspirin  EC 81 MG tablet Take 81 mg by mouth daily.    [provider]  atorvastatin  (LIPITOR ) 80 MG tablet Take 1 tablet (80 mg total) by mouth daily. 02/07/21   Dann Candyce RAMAN, MD  balsalazide (COLAZAL ) 750 MG capsule Take 3 capsules by mouth in the morning and at bedtime. 08/12/17   [provider]  donepezil (ARICEPT) 5 MG tablet Take 5 mg by mouth at bedtime. 09/09/20   [provider]  levothyroxine (SYNTHROID) 50 MCG tablet Take 50 mcg by mouth daily before breakfast.    [provider]  loperamide (IMODIUM A-D) 2 MG tablet Takes one daily    [provider]  Magnesium  400 MG CAPS Take 400 mg  by mouth daily. 01/29/23   Hildegard Loge, PA-C  meclizine (ANTIVERT) 12.5 MG tablet Take 1 tablet (12.5 mg total) by mouth 3 (three) times daily as needed for dizziness. 04/04/24   [name], MD  Multiple Vitamin (MULTIVITAMIN PO) Take by mouth.    [provider]  nitroGLYCERIN  (NITROSTAT ) 0.4 MG SL tablet Place 1 tablet (0.4 mg total) under the tongue every 5 (five) minutes as needed for chest pain. 04/27/23 07/26/23  Jordan, Peter M, MD  omeprazole (PRILOSEC) 20 MG capsule Take 20 mg by mouth 3 (three) times a week. 08/12/17   [provider]  ramipril  (ALTACE ) 5 MG capsule Take 1 capsule (5 mg total) by mouth daily. 02/07/21   Dann Candyce RAMAN, MD     Critical care time: 31 mins

## 2024-04-22 NOTE — ED Provider Notes (Signed)
 [place] EMERGENCY DEPARTMENT AT [facility] Provider Note   CSN: [id number] Arrival date & time: 04/22/24  1418     Patient presents with: No chief complaint on file.   Colin Larson is a 89 y.o. male.   HPI   ' Presents with fever and chills.  Patient had a fever 101 this morning.  Patient states that he has been having chills over the past day or so as well.  Son at bedside states he just seems a lot weaker than his baseline.  Patient denies any recent falls.  Endorses a runny nose.  Nonproductive cough.  No chest pain.  No shortness of breath.  No nausea vomiting or diarrhea.  No abdominal pain.  Bowel moods been regular.  Patient states he does have a history of Crohn's but currently has not noticed any kind of significant pain or any kind of blood in the stool.  No dysuria.  No sick contacts he is aware of  Previous medical history reviewed : Patient seen urgent care today because of fever and dizziness.  Last admitted in 2022 in the setting of COVID-19 infection.  Dehydration.   Prior to Admission medications   Medication Sig Start Date End Date Taking? Authorizing Provider  acetaminophen  (TYLENOL ) 500 MG tablet Take 1,000 mg by mouth every 6 (six) hours as needed for mild pain or moderate pain.    [provider]  aspirin  EC 81 MG tablet Take 81 mg by mouth daily.    [provider]  atorvastatin  (LIPITOR ) 80 MG tablet Take 1 tablet (80 mg total) by mouth daily. 02/07/21   Dann Candyce RAMAN, MD  balsalazide (COLAZAL ) 750 MG capsule Take 3 capsules by mouth in the morning and at bedtime. 08/12/17   [provider]  donepezil (ARICEPT) 5 MG tablet Take 5 mg by mouth at bedtime. 09/09/20   [provider]  levothyroxine (SYNTHROID) 50 MCG tablet Take 50 mcg by mouth daily before breakfast.    [provider]  loperamide (IMODIUM A-D) 2 MG tablet Takes one daily    [provider]  Magnesium  400 MG CAPS Take 400  mg by mouth daily. 01/29/23   Hildegard Loge, PA-C  meclizine (ANTIVERT) 12.5 MG tablet Take 1 tablet (12.5 mg total) by mouth 3 (three) times daily as needed for dizziness. 04/04/24   [name], MD  Multiple Vitamin (MULTIVITAMIN PO) Take by mouth.    [provider]  nitroGLYCERIN  (NITROSTAT ) 0.4 MG SL tablet Place 1 tablet (0.4 mg total) under the tongue every 5 (five) minutes as needed for chest pain. 04/27/23 07/26/23  Jordan, Peter M, MD  omeprazole (PRILOSEC) 20 MG capsule Take 20 mg by mouth 3 (three) times a week. 08/12/17   [provider]  ramipril  (ALTACE ) 5 MG capsule Take 1 capsule (5 mg total) by mouth daily. 02/07/21   Dann Candyce RAMAN, MD    Allergies: Bee venom    Review of Systems  Constitutional:  Negative for chills and fever.  HENT:  Negative for ear pain and sore throat.   Eyes:  Negative for pain and visual disturbance.  Respiratory:  Negative for cough and shortness of breath.   Cardiovascular:  Negative for chest pain and palpitations.  Gastrointestinal:  Negative for abdominal pain and vomiting.  Genitourinary:  Negative for dysuria and hematuria.  Musculoskeletal:  Negative for arthralgias and back pain.  Skin:  Negative for color change and rash.  Neurological:  Negative for seizures  and syncope.  All other systems reviewed and are negative.   Updated Vital Signs BP 105/77   Pulse 76   Temp 97.8 F (36.6 C) (Oral)   Resp (!) 21   Ht 5' 5 (1.651 m)   Wt 57.2 kg   SpO2 93%   BMI 20.97 kg/m   Physical Exam Vitals and nursing note reviewed.  Constitutional:      General: He is not in acute distress.    Appearance: He is well-developed.  HENT:     Head: Normocephalic and atraumatic.  Eyes:     Conjunctiva/sclera: Conjunctivae normal.  Cardiovascular:     Rate and Rhythm: Normal rate and regular rhythm.     Heart sounds: No murmur heard. Pulmonary:     Effort: Pulmonary effort is normal. No respiratory distress.     Breath  sounds: Normal breath sounds.  Abdominal:     Palpations: Abdomen is soft.     Tenderness: There is no abdominal tenderness.  Musculoskeletal:        General: No swelling.     Cervical back: Neck supple.  Skin:    General: Skin is warm and dry.     Capillary Refill: Capillary refill takes less than 2 seconds.  Neurological:     Mental Status: He is alert.  Psychiatric:        Mood and Affect: Mood normal.     (all labs ordered are listed, but only abnormal results are displayed) Labs Reviewed  COMPREHENSIVE METABOLIC PANEL WITH GFR - Abnormal; Notable for the following components:      Result Value   Creatinine, Ser 1.78 (*)    Total Protein 6.0 (*)    Albumin 3.1 (*)    AST 92 (*)    GFR, Estimated 36 (*)    All other components within normal limits  CBC WITH DIFFERENTIAL/PLATELET - Abnormal; Notable for the following components:   WBC 19.3 (*)    RBC 3.96 (*)    Hemoglobin 12.1 (*)    HCT 37.0 (*)    Neutro Abs 17.8 (*)    All other components within normal limits  URINALYSIS, W/ REFLEX TO CULTURE (INFECTION SUSPECTED) - Abnormal; Notable for the following components:   Color, Urine AMBER (*)    APPearance HAZY (*)    Protein, ur 30 (*)    Leukocytes,Ua SMALL (*)    Bacteria, UA RARE (*)    All other components within normal limits  LACTIC ACID, PLASMA - Abnormal; Notable for the following components:   Lactic Acid, Venous 2.2 (*)    All other components within normal limits  LACTIC ACID, PLASMA - Abnormal; Notable for the following components:   Lactic Acid, Venous 2.4 (*)    All other components within normal limits  I-STAT CG4 LACTIC ACID, ED - Abnormal; Notable for the following components:   Lactic Acid, Venous 4.2 (*)    All other components within normal limits  I-STAT CHEM 8, ED - Abnormal; Notable for the following components:   Creatinine, Ser 1.70 (*)    Hemoglobin 12.2 (*)    HCT 36.0 (*)    All other components within normal limits  I-STAT CG4  LACTIC ACID, ED - Abnormal; Notable for the following components:   Lactic Acid, Venous 4.1 (*)    All other components within normal limits  I-STAT CG4 LACTIC ACID, ED - Abnormal; Notable for the following components:   Lactic Acid, Venous 2.1 (*)    All other  components within normal limits  CULTURE, BLOOD (ROUTINE X 2)  RESP PANEL BY RT-PCR (RSV, FLU A&B, COVID)  RVPGX2  MRSA NEXT GEN BY PCR, NASAL  CULTURE, BLOOD (ROUTINE X 2)  RESPIRATORY PANEL BY PCR  PROTIME-INR  CBC  BASIC METABOLIC PANEL WITH GFR  MAGNESIUM   PHOSPHORUS  TSH  STREP PNEUMONIAE URINARY ANTIGEN  LEGIONELLA PNEUMOPHILA SEROGP 1 UR AG  PROCALCITONIN  LEGIONELLA PNEUMOPHILA SEROGP 1 UR AG  LACTIC ACID, PLASMA    EKG: EKG Interpretation Date/Time:  Friday April 22 2024 15:04:51 EST Ventricular Rate:  74 PR Interval:  180 QRS Duration:  147 QT Interval:  386 QTC Calculation: 429 R Axis:   7  Text Interpretation: Sinus rhythm Right bundle branch block Confirmed by Simon Rea [id number]) on 04/22/2024 3:29:32 PM  Radiology: [facility] Chest Port 1 View Result Date: 04/22/2024 EXAM: 1 VIEW(S) XRAY OF THE CHEST 04/22/2024 05:35:24 PM COMPARISON: Chest x-ray 01/29/2023. CLINICAL HISTORY: Questionable sepsis - evaluate for abnormality. FINDINGS: LUNGS AND PLEURA: There are patchy right lower lobe airspace opacities worrisome for pneumonia. No pulmonary edema. No pleural effusion. No pneumothorax. HEART AND MEDIASTINUM: No acute abnormality of the cardiac and mediastinal silhouettes. BONES AND SOFT TISSUES: No acute osseous abnormality. IMPRESSION: 1. Patchy right lower lobe airspace opacities, worrisome for pneumonia. Electronically signed by: Greig Pique MD 04/22/2024 06:06 PM EST RP Workstation: [id number]     Procedures   Medications Ordered in the ED  azithromycin (ZITHROMAX) 500 mg in sodium chloride  0.9 % 250 mL IVPB (has no administration in time range)  cefTRIAXone (ROCEPHIN) 2 g in sodium chloride  0.9 % 100  mL IVPB (has no administration in time range)  heparin injection 5,000 Units (0 Units Subcutaneous Hold 04/22/24 2101)  Chlorhexidine Gluconate Cloth 2 % PADS 6 each (6 each Topical Given 04/22/24 1930)  lactated ringers  infusion ( Intravenous New Bag/Given 04/22/24 1826)  docusate sodium (COLACE) capsule 100 mg (has no administration in time range)  polyethylene glycol (MIRALAX  / GLYCOLAX ) packet 17 g (has no administration in time range)  aspirin  EC tablet 81 mg (81 mg Oral Given 04/22/24 1813)  atorvastatin  (LIPITOR ) tablet 80 mg (has no administration in time range)  donepezil (ARICEPT) tablet 5 mg (5 mg Oral Given 04/22/24 2125)  levothyroxine (SYNTHROID) tablet 50 mcg (has no administration in time range)  meclizine (ANTIVERT) tablet 12.5 mg (has no administration in time range)  balsalazide (COLAZAL ) capsule 2,250 mg (has no administration in time range)  loperamide (IMODIUM) capsule 2 mg (2 mg Oral Given 04/22/24 1813)  0.9 %  sodium chloride  infusion ( Intravenous Canceled Entry 04/22/24 2100)  norepinephrine (LEVOPHED) 4mg  in 250mL (0.016 mg/mL) premix infusion (5 mcg/min Intravenous Rate/Dose Change 04/22/24 1850)  pantoprazole  (PROTONIX ) EC tablet 40 mg (40 mg Oral Given 04/22/24 1813)  Oral care mouth rinse (has no administration in time range)  lactated ringers  bolus 1,000 mL (0 mLs Intravenous Stopped 04/22/24 1701)  cefTRIAXone (ROCEPHIN) 2 g in sodium chloride  0.9 % 100 mL IVPB (0 g Intravenous Stopped 04/22/24 1658)  sodium chloride  0.9 % bolus 750 mL (0 mLs Intravenous Stopped 04/22/24 1807)  azithromycin (ZITHROMAX) 500 mg in sodium chloride  0.9 % 250 mL IVPB (0 mg Intravenous Stopped 04/22/24 1807)  sodium chloride  0.9 % bolus 500 mL (0 mLs Intravenous Stopped 04/22/24 1807)  Medical Decision Making Risk Decision regarding hospitalization.     HPI:    ' Presents with fever and chills.  Patient had a fever 101 this morning.  Patient states  that he has been having chills over the past day or so as well.  Son at bedside states he just seems a lot weaker than his baseline.  Patient denies any recent falls.  Endorses a runny nose.  Nonproductive cough.  No chest pain.  No shortness of breath.  No nausea vomiting or diarrhea.  No abdominal pain.  Bowel moods been regular.  Patient states he does have a history of Crohn's but currently has not noticed any kind of significant pain or any kind of blood in the stool.  No dysuria.  No sick contacts he is aware of  Previous medical history reviewed : Patient seen urgent care today because of fever and dizziness.  Last admitted in 2022 in the setting of COVID-19 infection.  Dehydration.  MDM:    Upon exam, patient ANO x 3 GCS 15.  Hypotensive.  Slight tachypnea.  Afebrile and no tachycardia  Started patient on liter of fluids.  Also started patient on ceftriaxone.  For like this very well could be more of a pulmonary source.  Soft and benign abdomen at this point in time.  Will obtain laboratory workup lactic acid CBC.  Obtain UA as well to rule out UTI.  Currently, my concern for any kind of intra-abdominal pathology is very low.  At this chest x-ray is unremarkable they may consider a CT scan of his abdomen given history of Crohn's but currently do not feel I can need to jump straight to this  Reevaluation:   Upon reexamination, patient hemodynamically stable.  Remains A&O x 3 with GCS 15.  Patient initially received 1750 cc of fluid which is approximately 30 cc/kg.  Patient maps hovered around 16.  Mentation appropriate.   Spoke to ICU.  They will admit the patient due to borderline blood pressure measurements in the setting of concern for sepsis.   Chest x-ray per my read showed a right lower lobar pneumonia.  Patient was started on ceftriaxone and azithromycin to cover for community-acquired pneumonia  Soft and benign abdomen.  No rebound guarding or tenderness at this  time.   Interventions: ceftriaxone, azithromycin, 2250 cc of ns   EKG Interpreted by Me: sinus    Cardiac Tele Interpreted by Me: sinus    I have independently interpreted the CXR   images and agree with the radiologist finding   Social Determinant of Health: advanced age    Disposition and Follow Up: admit     CRITICAL CARE Performed by: Lavonia LOISE Pat   Total critical care time: 45 minutes  Critical care time was exclusive of separately billable procedures and treating other patients.  Critical care was necessary to treat or prevent imminent or life-threatening deterioration.  Critical care was time spent personally by me on the following activities: development of treatment plan with patient and/or surrogate as well as nursing, discussions with consultants, evaluation of patient's response to treatment, examination of patient, obtaining history from patient or surrogate, ordering and performing treatments and interventions, ordering and review of laboratory studies, ordering and review of radiographic studies, pulse oximetry and re-evaluation of patient's condition.      Final diagnoses:  Sepsis due to pneumonia (HCC)  AKI (acute kidney injury)  Lactic acidosis      ED Discharge Orders     None  Simon Lavonia SAILOR, MD 04/22/24 2236

## 2024-04-22 NOTE — ED Triage Notes (Addendum)
 Patient sent from urgent care for hypotension, dizziness and fever. On arrival to ed patient states that he was sent because everything is low. He is hypotensive on arrival to ed. He was running a fever of 101.7 earlier this morning and his wife gave him tylenol  around 12:15. Afebrile on arrival to ed.  His son states he has been weaker than normal recently also. He reports fever, chills, coughing up green phlegm.

## 2024-04-23 LAB — CBC
HCT: 30.6 % — ABNORMAL LOW (ref 39.0–52.0)
Hemoglobin: 10.3 g/dL — ABNORMAL LOW (ref 13.0–17.0)
MCH: 30.7 pg (ref 26.0–34.0)
MCHC: 33.7 g/dL (ref 30.0–36.0)
MCV: 91.1 fL (ref 80.0–100.0)
Platelets: 178 K/uL (ref 150–400)
RBC: 3.36 MIL/uL — ABNORMAL LOW (ref 4.22–5.81)
RDW: 14.8 % (ref 11.5–15.5)
WBC: 20.3 K/uL — ABNORMAL HIGH (ref 4.0–10.5)
nRBC: 0 % (ref 0.0–0.2)

## 2024-04-23 LAB — BASIC METABOLIC PANEL WITH GFR
Anion gap: 9 (ref 5–15)
BUN: 18 mg/dL (ref 8–23)
CO2: 24 mmol/L (ref 22–32)
Calcium: 8.9 mg/dL (ref 8.9–10.3)
Chloride: 104 mmol/L (ref 98–111)
Creatinine, Ser: 1.17 mg/dL (ref 0.61–1.24)
GFR, Estimated: >60 mL/min (ref >60)
Glucose, Bld: 81 mg/dL (ref 70–99)
Potassium: 4.3 mmol/L (ref 3.5–5.1)
Sodium: 137 mmol/L (ref 135–145)

## 2024-04-23 LAB — RESPIRATORY PANEL BY PCR

## 2024-04-23 LAB — PHOSPHORUS: Phosphorus: 2.5 mg/dL (ref 2.5–4.6)

## 2024-04-23 LAB — MAGNESIUM: Magnesium: 1.2 mg/dL — ABNORMAL LOW (ref 1.7–2.4)

## 2024-04-23 LAB — LACTIC ACID, PLASMA: Lactic Acid, Venous: 1.6 mmol/L (ref 0.5–1.9)

## 2024-04-23 LAB — STREP PNEUMONIAE URINARY ANTIGEN: Strep Pneumo Urinary Antigen: NEGATIVE

## 2024-04-23 MED ORDER — MAGNESIUM SULFATE 2 GM/50ML IV SOLN
2.0000 g | Freq: Once | INTRAVENOUS | Status: AC
  Administered 2024-04-23: 2 g via INTRAVENOUS
  Filled 2024-04-23: qty 50

## 2024-04-23 MED ORDER — MAGNESIUM SULFATE 4 GM/100ML IV SOLN
4.0000 g | Freq: Once | INTRAVENOUS | Status: AC
  Administered 2024-04-23: 4 g via INTRAVENOUS
  Filled 2024-04-23: qty 100

## 2024-04-23 NOTE — Progress Notes (Signed)
   NAME:  Colin Larson, MRN:  [id number], DOB:  Mar 15, 1935, LOS: 1 ADMISSION DATE:  04/22/2024, CONSULTATION DATE:  04/22/24 REFERRING MD:  Simon CHIEF COMPLAINT:  Weakness   History of Present Illness:  Colin Larson is a 89 y.o. male who has a PMH as outlined below.  He presented to urgent care on 11/7 with fever to 101 earlier that morning, chills, weakness.  While at urgent care he was noted to be hypotensive with systolic in the 70s; therefore, he was sent to the ED for further evaluation.  He had apparently been coughing up green stuff in the preceding days.  Denied any chest pain, dyspnea, N/V/D, abdominal pain, exposures to known sick contacts, recent travel.  While in the ED, he was hypotensive on presentation again with systolics in the 90s.  He received 30 mL/kg of normal saline (1.5 L) but he remained hypotensive.  His initial lactate was 4.2 with repeat being 4.1.  CXR was concerning for right basilar infiltrate.  Due to ongoing hypotension and lactic acidosis, PCCM called for admission.   Having some BPPV that was improving, seems like fatigue and orthostasis started pretty hard yesterday.  Does have some mild cognitive impairement but usually pretty active walks 5x a week fair distances for exercise.  Baseline aBps somewhere around 100-120 per wife.  Pertinent  Medical History:  has Mixed hyperlipidemia; GERD (gastroesophageal reflux disease); Diverticulosis of colon with hemorrhage; Coronary artery disease involving native coronary artery of native heart without angina pectoris; Crohn disease (HCC); Carotid artery disease; GI bleeding; Mild aortic stenosis; Mild mitral regurgitation; History of anemia; Essential hypertension; Melanoma (HCC); Unilateral primary osteoarthritis, right knee; Chronic pain of right knee; Unilateral primary osteoarthritis, left knee; Elevated LFTs; Dizziness; Bradycardia; COVID-19 virus infection; Hyponatremia; Lactic acidosis; and Shock (HCC) on  their problem list.   Significant Hospital Events: Including procedures, antibiotic start and stop dates in addition to other pertinent events   11/7 admit  Interim History / Subjective:  Feels better  Objective:  Blood pressure 117/61, pulse 71, temperature 97.6 F (36.4 C), temperature source Oral, resp. rate (!) 28, height 5' 5 (1.651 m), weight 57.2 kg, SpO2 93%.        Intake/Output Summary (Last 24 hours) at 04/23/2024 1558 Last data filed at 04/23/2024 1200 Gross per 24 hour  Intake 224.25 ml  Output 975 ml  Net -750.75 ml   Filed Weights   04/22/24 1512  Weight: 57.2 kg     Physical Exam: No distress Moving everything Occasionally seems confused Globally weak Heart sounds regular   Labs/imaging personally reviewed:  CXR 11/7 > right basilar infiltrate  Assessment & Plan:   Septic shock secondary to RLL CAP- off pressors since this am Hx CAD, HTN, HLD, MI. Hx hypothyroidism- tsh ok Hx Vertigo. Hx Crohn's. AKI- prerenal + sepsis, improved with IVF  - Continue PTA ASA, statin. - Hold PTA Ramipril . - IVF until am - Continue PTA synthroid. - Continue PTA Meclizine. - Check orthostatics - Continue PTA Balsalazide, Loperamide. - Did well with SLP - Await PT/OT input - Okay for floor, appreciate TRH taking over starting 04/24/24  Rolan Sharps MD PCCM

## 2024-04-23 NOTE — Plan of Care (Signed)
 Patient transferred to 5C15. Belongings with patient. Wife updated of room change.

## 2024-04-23 NOTE — Progress Notes (Signed)
 Pharmacy Electrolyte Replacement  Recent Labs:  Recent Labs    04/23/24 0528  K 4.3  MG 1.2*  PHOS 2.5  CREATININE 1.17    Low Critical Values (K </= 2.5, Phos </= 1, Mg </= 1) Present: None  Plan: 6g magnesium  sulfate per protocol.   Powell Blush, PharmD, BCCCP

## 2024-04-23 NOTE — Discharge Instructions (Signed)
To the ER 

## 2024-04-23 NOTE — Evaluation (Signed)
 Clinical/Bedside Swallow Evaluation Patient Details  Name: Colin Larson MRN: [id number] Date of Birth: 1934/07/19  Today's Date: 04/23/2024 Time: SLP Start Time (ACUTE ONLY): 1110 SLP Stop Time (ACUTE ONLY): 1154 SLP Time Calculation (min) (ACUTE ONLY): 44 min  Past Medical History:  Past Medical History:  Diagnosis Date   Anemia    Arthritis    Carotid artery disease    Coronary artery disease    a. MI 2000 s/p BMS to prox/mid RCA, 70% lesion in abberant LCx that arises from right coronary cusp per Dr. Johnice note   Crohn disease (HCC)    GI bleeding    2014 - from diverticulosis/Crohn's 2014   Hiatal hernia    no surgery   Hypercholesteremia    Hypertension    Melanoma (HCC) 01/07/2012   MI (myocardial infarction) (HCC) 05/31/1999   Mild aortic stenosis    Mild mitral regurgitation    Skin cancer    Vertigo    Past Surgical History:  Past Surgical History:  Procedure Laterality Date   CHOLECYSTECTOMY     CORONARY STENT PLACEMENT  [id number]   INGUINAL HERNIA REPAIR     x 2   HPI:  Colin Larson is a 89 y.o. male who has a PMH as outlined below.  He presented to urgent care on 11/7 with fever to 101 earlier that morning, chills, weakness.  While at urgent care he was noted to be hypotensive with systolic in the 70s; therefore, he was sent to the ED for further evaluation.  He had apparently been coughing up green stuff in the preceding days.  Denied any chest pain, dyspnea, N/V/D, abdominal pain, exposures to known sick contacts, recent travel.  He has h/o COVID in 2022, esophageal dysmotility and reports TWO FALLS IN TWO WEEKS d/t VERTIGO.  Swallow eval ordered, likely due to pt's RLL PNA.    Assessment / Plan / Recommendation  Clinical Impression  Patient presents with functional oropharyngeal swallow based on clinical swallow evaluation - He has also recently undergone prior OP MBS which was normal and esophagram that showed mild dysmotility. Suspect his  dysmotility is his primary aspiration risk. Reviewed prior MBS flouroscopy loops with the pt using teach back.  Pt admits to falls due to vertigo PTA but denies vomiting or having significant coughing episodes with eating.   He is edentulous and his dentures are at home, thus call his wife on my cell to request she bring his dentures and hearing aid charger.  Wife reports she will bring them at approx 3 pm.  Swallow judged to be swift with no oral retention or indication of dysphagia or airway compromise.   Provided pt with verbal and written esophageal precautions.  No SLP follow up indicated, thanks for this consult. SLP Visit Diagnosis: Dysphagia, unspecified (R13.10)    Aspiration Risk  Mild aspiration risk    Diet Recommendation Regular;Thin liquid    Liquid Administration via: Cup;Straw Medication Administration: Whole meds with liquid Supervision: Intermittent supervision to cue for compensatory strategies Compensations: Slow rate;Small sips/bites Postural Changes: Seated upright at 90 degrees;Remain upright for at least 30 minutes after po intake    Other  Recommendations Oral Care Recommendations: Oral care BID     Assistance Recommended at Discharge    Functional Status Assessment Patient has not had a recent decline in their functional status  Frequency and Duration     N/a       Prognosis   N/a     Swallow  Study   General Date of Onset: 04/23/24 HPI: Colin Larson is a 89 y.o. male who has a PMH as outlined below.  He presented to urgent care on 11/7 with fever to 101 earlier that morning, chills, weakness.  While at urgent care he was noted to be hypotensive with systolic in the 70s; therefore, he was sent to the ED for further evaluation.  He had apparently been coughing up green stuff in the preceding days.  Denied any chest pain, dyspnea, N/V/D, abdominal pain, exposures to known sick contacts, recent travel.  He has h/o COVID in 2022, esophageal dysmotility and  reports TWO FALLS IN TWO WEEKS d/t VERTIGO.  Swallow eval ordered, likely due to pt's RLL PNA. Diet Prior to this Study: Regular;Thin liquids (Level 0) Temperature Spikes Noted: No Respiratory Status: Nasal cannula History of Recent Intubation: No Behavior/Cognition: Alert (was trying to get OOB when SLP saw him) Oral Cavity Assessment: Within Functional Limits Oral Care Completed by SLP: No Oral Cavity - Dentition: Dentures, not available Vision: Functional for self-feeding Self-Feeding Abilities: Able to feed self Patient Positioning: Upright in bed Baseline Vocal Quality: Normal Volitional Cough: Strong Volitional Swallow: Able to elicit    Oral/Motor/Sensory Function Overall Oral Motor/Sensory Function: Within functional limits   Ice Chips Ice chips: Not tested   Thin Liquid Thin Liquid: Within functional limits Presentation: Cup;Self Fed;Straw    Nectar Thick Nectar Thick Liquid: Not tested   Honey Thick     Puree Puree: Not tested   Solid     Solid: Within functional limits Presentation: Self Colin Larson 04/23/2024,12:02 PM  Madelin POUR, MS [facility] SLP Acute Rehab Services Office [phone]

## 2024-04-24 DIAGNOSIS — J189 Pneumonia, unspecified organism: Secondary | ICD-10-CM | POA: Diagnosis not present

## 2024-04-24 DIAGNOSIS — A419 Sepsis, unspecified organism: Principal | ICD-10-CM

## 2024-04-24 LAB — CBC
HCT: 31.7 % — ABNORMAL LOW (ref 39.0–52.0)
Hemoglobin: 10.6 g/dL — ABNORMAL LOW (ref 13.0–17.0)
MCH: 30.5 pg (ref 26.0–34.0)
MCHC: 33.4 g/dL (ref 30.0–36.0)
MCV: 91.1 fL (ref 80.0–100.0)
Platelets: 164 K/uL (ref 150–400)
RBC: 3.48 MIL/uL — ABNORMAL LOW (ref 4.22–5.81)
RDW: 14.9 % (ref 11.5–15.5)
WBC: 13.5 K/uL — ABNORMAL HIGH (ref 4.0–10.5)
nRBC: 0 % (ref 0.0–0.2)

## 2024-04-24 LAB — BASIC METABOLIC PANEL WITH GFR
Anion gap: 6 (ref 5–15)
BUN: 14 mg/dL (ref 8–23)
CO2: 22 mmol/L (ref 22–32)
Calcium: 8.9 mg/dL (ref 8.9–10.3)
Chloride: 104 mmol/L (ref 98–111)
Creatinine, Ser: 0.98 mg/dL (ref 0.61–1.24)
GFR, Estimated: >60 mL/min (ref >60)
Glucose, Bld: 82 mg/dL (ref 70–99)
Potassium: 4.1 mmol/L (ref 3.5–5.1)
Sodium: 132 mmol/L — ABNORMAL LOW (ref 135–145)

## 2024-04-24 LAB — MAGNESIUM: Magnesium: 2.2 mg/dL (ref 1.7–2.4)

## 2024-04-24 LAB — PHOSPHORUS: Phosphorus: 2.3 mg/dL — ABNORMAL LOW (ref 2.5–4.6)

## 2024-04-24 MED ORDER — PNEUMOCOCCAL 20-VAL CONJ VACC 0.5 ML IM SUSY
0.5000 mL | PREFILLED_SYRINGE | INTRAMUSCULAR | Status: AC
  Administered 2024-04-26: 0.5 mL via INTRAMUSCULAR
  Filled 2024-04-24: qty 0.5

## 2024-04-24 MED ORDER — TAMSULOSIN HCL 0.4 MG PO CAPS
0.4000 mg | ORAL_CAPSULE | Freq: Every day | ORAL | Status: DC
  Administered 2024-04-24 – 2024-04-26 (×3): 0.4 mg via ORAL
  Filled 2024-04-24 (×3): qty 1

## 2024-04-24 MED ORDER — K PHOS MONO-SOD PHOS DI & MONO 155-852-130 MG PO TABS
500.0000 mg | ORAL_TABLET | Freq: Three times a day (TID) | ORAL | Status: AC
  Administered 2024-04-24 (×3): 500 mg via ORAL
  Filled 2024-04-24 (×3): qty 2

## 2024-04-24 MED ORDER — HEPARIN SODIUM (PORCINE) 5000 UNIT/ML IJ SOLN
5000.0000 [IU] | Freq: Three times a day (TID) | INTRAMUSCULAR | Status: DC
  Administered 2024-04-25 – 2024-04-26 (×3): 5000 [IU] via SUBCUTANEOUS
  Filled 2024-04-24 (×3): qty 1

## 2024-04-24 MED ORDER — ENSURE PLUS HIGH PROTEIN PO LIQD
237.0000 mL | Freq: Two times a day (BID) | ORAL | Status: DC
  Administered 2024-04-24 – 2024-04-25 (×2): 237 mL via ORAL

## 2024-04-24 NOTE — Progress Notes (Signed)
 Dr. Joette notified of patient is having small amount of bleeding from penis and sch. to have heparin sq. MD ordered to hold heparin sq. Sherline Jubilee

## 2024-04-24 NOTE — Plan of Care (Signed)

## 2024-04-24 NOTE — Progress Notes (Signed)
 Per pharmacy balsalazide will have to be brought in by family as this pharmacy does not supply this.  Educated patient

## 2024-04-24 NOTE — Evaluation (Signed)
 Occupational Therapy Evaluation Patient Details Name: Colin Larson MRN: [id number] DOB: 1935/06/05 Today's Date: 04/24/2024   History of Present Illness   Colin Larson is a 89 y.o. male who presented to urgent care on 11/7 with fever to 101,chills, weakness; Was noted to be hypotensive with systolic in the 70s; therefore, he was sent to the ED for further evaluation; productive cough, green stuff in the preceding days; working diagnosis of Septic shock secondary to RLL CAP; PMH includes Elevated LFTs; Dizziness; Bradycardia; COVID-19 virus infection; Hyponatremia; Lactic acidosis; and Shock (HCC), vertigo, CAD,     Clinical Impressions Patient admitted for the diagnosis above.  PTA he lives at home with his spouse, and assist from family.  Patient is hoping to return home tomorrow.  No post acute OT is anticipated.  OT will follow in the acute setting.  Inspirometer setup and patient educated on its use.       If plan is discharge home, recommend the following:   Assist for transportation     Functional Status Assessment   Patient has had a recent decline in their functional status and demonstrates the ability to make significant improvements in function in a reasonable and predictable amount of time.     Equipment Recommendations   None recommended by OT     Recommendations for Other Services         Precautions/Restrictions   Precautions Precautions: Fall Restrictions Weight Bearing Restrictions Per Provider Order: No     Mobility Bed Mobility               General bed mobility comments: Up in the recliner    Transfers Overall transfer level: Needs assistance Equipment used: Rolling walker (2 wheels) Transfers: Sit to/from Stand, Bed to chair/wheelchair/BSC Sit to Stand: Supervision     Step pivot transfers: Supervision            Balance Overall balance assessment: Needs assistance Sitting-balance support: Feet  supported Sitting balance-Leahy Scale: Good     Standing balance support: Reliant on assistive device for balance Standing balance-Leahy Scale: Fair                             ADL either performed or assessed with clinical judgement   ADL       Grooming: Wash/dry hands;Wash/dry face;Supervision/safety;Standing               Lower Body Dressing: Supervision/safety;Sit to/from stand   Toilet Transfer: Supervision/safety;Rolling walker (2 wheels);Ambulation;Comfort height toilet                   Vision Baseline Vision/History: 1 Wears glasses Patient Visual Report: No change from baseline       Perception Perception: Not tested       Praxis Praxis: Not tested       Pertinent Vitals/Pain Pain Assessment Pain Assessment: No/denies pain     Extremity/Trunk Assessment Upper Extremity Assessment Upper Extremity Assessment: Overall WFL for tasks assessed   Lower Extremity Assessment Lower Extremity Assessment: Defer to PT evaluation   Cervical / Trunk Assessment Cervical / Trunk Assessment: Normal   Communication Communication Communication: Impaired Factors Affecting Communication: Hearing impaired   Cognition Arousal: Alert Behavior During Therapy: WFL for tasks assessed/performed Cognition: No apparent impairments  Following commands: Intact       Cueing  General Comments   Cueing Techniques: Verbal cues      Exercises     Shoulder Instructions      Home Living Family/patient expects to be discharged to:: Private residence Living Arrangements: Spouse/significant other Available Help at Discharge: Family;Available 24 hours/day Type of Home: House Home Access: Stairs to enter [employer] of Steps: 4 Entrance Stairs-Rails: Right;Left;Can reach both Home Layout: One level     Bathroom Shower/Tub: Tub only;Walk-in shower   Bathroom Toilet: Handicapped  height Bathroom Accessibility: Yes How Accessible: Accessible via walker Home Equipment: Rollator (4 wheels);Shower seat          Prior Functioning/Environment Prior Level of Function : Independent/Modified Independent             Mobility Comments: 4WRW outside. ADLs Comments: Has a shower seat if needed.  Wants to have grabbars installed.  Continues to be Mod I for ADL, toileting.  Generally orders food, but son assists with groceries.    OT Problem List: Decreased activity tolerance;Impaired balance (sitting and/or standing)   OT Treatment/Interventions: Self-care/ADL training;Therapeutic activities;Patient/family education;Balance training;DME and/or AE instruction      OT Goals(Current goals can be found in the care plan section)   Acute Rehab OT Goals Patient Stated Goal: Hoping to return hme tomorrow OT Goal Formulation: With patient Time For Goal Achievement: 05/02/24 Potential to Achieve Goals: Good ADL Goals Pt Will Perform Grooming: with modified independence;standing Pt Will Perform Lower Body Dressing: with modified independence;sit to/from stand Pt Will Transfer to Toilet: with modified independence;ambulating;regular height toilet   OT Frequency:  Min 2X/week    Co-evaluation              AM-PAC OT 6 Clicks Daily Activity     Outcome Measure Help from another person eating meals?: None Help from another person taking care of personal grooming?: None Help from another person toileting, which includes using toliet, bedpan, or urinal?: A Little Help from another person bathing (including washing, rinsing, drying)?: A Little Help from another person to put on and taking off regular upper body clothing?: None Help from another person to put on and taking off regular lower body clothing?: A Little 6 Click Score: 21   End of Session Equipment Utilized During Treatment: Gait belt;Rolling walker (2 wheels) Nurse Communication: Mobility  status  Activity Tolerance: Patient tolerated treatment well Patient left: in chair;with call bell/phone within reach;with chair alarm set  OT Visit Diagnosis: Unsteadiness on feet (R26.81)                Time: 8799-8778 OT Time Calculation (min): 21 min Charges:  OT General Charges $OT Visit: 1 Visit OT Evaluation $OT Eval Moderate Complexity: 1 Mod  04/24/2024  RP, OTR/L  Acute Rehabilitation Services  Office:  [phone]   Colin Larson 04/24/2024, 12:45 PM

## 2024-04-24 NOTE — Progress Notes (Signed)
 Pt started destating while sleeping to 85% O2. Placed 2 liters O2 for sleep.  Pt educated about incentive spirometer. Pt states he has been using during the day and is able to get indicator almost to 2000

## 2024-04-24 NOTE — Progress Notes (Signed)
 Called portable equipment x 3 for cont pulse ox monitor. No answer

## 2024-04-24 NOTE — Plan of Care (Signed)
  Problem: Education: Goal: Knowledge of General Education information will improve Description: Including pain rating scale, medication(s)/side effects and non-pharmacologic comfort measures Outcome: Progressing   Problem: Health Behavior/Discharge Planning: Goal: Ability to manage health-related needs will improve Outcome: Progressing   Problem: Clinical Measurements: Goal: Will remain free from infection Outcome: Progressing Goal: Diagnostic test results will improve Outcome: Progressing   Problem: Pain Managment: Goal: General experience of comfort will improve and/or be controlled Outcome: Progressing   Problem: Safety: Goal: Ability to remain free from injury will improve Outcome: Progressing   Problem: Skin Integrity: Goal: Risk for impaired skin integrity will decrease Outcome: Progressing

## 2024-04-24 NOTE — Progress Notes (Signed)
 Notified Dr. Joette of pt urinated but it was mixed with stool and post void bladder scan showed 0ml. Per MD please let night RN know to monitor UO and do bladder scan as needed. Colin Larson

## 2024-04-24 NOTE — Progress Notes (Addendum)
 [facility] PROGRESS NOTE   Colin Larson FMW:[id number] DOB: 21-Feb-1935 DOA: 04/22/2024  PCP: [name], MD  Brief History: 89 y.o. male with past medical history of coronary artery disease, chron's disease, mild aortic stenosis who presented to urgent care on 11/7 with fever to 101 earlier that morning, chills, weakness.  While at urgent care he was noted to be hypotensive with systolic in the 70s; therefore, he was sent to the ED for further evaluation.  Patient was noted to be hypotensive in the emergency department.  He was found to have pneumonia.  He was initially hospitalized to the ICU.  He required pressors.  He was stabilized and then transferred to the floor.    Consultants: None  Procedures: None    Subjective/Interval History: Patient mentioned that he feels much better.  He is sitting up on the bed eating his breakfast.  Denies any significant cough.  No shortness of breath.  No chest pain.    Assessment/Plan:  Septic shock secondary to pneumonia Patient required pressors in the ICU.  Stabilized.  He is normotensive.  Antihypertensives are on hold.  Community-acquired pneumonia Chest x-ray revealed infiltrate in the right lower lobe.  Patient currently on ceftriaxone and azithromycin.  Respiratory status is noted to be stable. WBC has improved.  Procalcitonin was significantly elevated at 30.37.  Follow-up on blood cultures.  Lactic acidosis has improved. COVID-19, influenza and RSV PCR negative.  Respiratory viral panel negative.  Hypothyroidism Continue levothyroxine.  TSH is normal.  Hyponatremia/hypophosphatemia Supplement phosphorus.  Monitor sodium levels daily for now.    Normocytic anemia No evidence of overt bleeding.  Check labs daily for now.  Check anemia panel.  DVT Prophylaxis: Subcutaneous heparin Code Status: Full code Family Communication: No family at bedside Disposition Plan: PT OT eval pending.  Noted to have a Foley  catheter which can be discontinued today.     Medications: Scheduled:  aspirin  EC  81 mg Oral Daily   atorvastatin   80 mg Oral Daily   balsalazide  2,250 mg Oral BID   Chlorhexidine Gluconate Cloth  6 each Topical Daily   donepezil  5 mg Oral QHS   heparin  5,000 Units Subcutaneous Q8H   levothyroxine  50 mcg Oral QAC breakfast   loperamide  2 mg Oral Daily   pantoprazole   40 mg Oral Daily   phosphorus  500 mg Oral TID   Continuous:  azithromycin Stopped (04/23/24 1746)   cefTRIAXone (ROCEPHIN)  IV Stopped (04/23/24 1828)   norepinephrine (LEVOPHED) Adult infusion Stopped (04/23/24 0908)   PRN:docusate sodium, meclizine, mouth rinse, polyethylene glycol  Antibiotics: Anti-infectives (From admission, onward)    Start     Dose/Rate Route Frequency Ordered Stop   04/23/24 1600  azithromycin (ZITHROMAX) 500 mg in sodium chloride  0.9 % 250 mL IVPB        500 mg 250 mL/hr over 60 Minutes Intravenous Every 24 hours 04/22/24 1707     04/23/24 1600  cefTRIAXone (ROCEPHIN) 2 g in sodium chloride  0.9 % 100 mL IVPB        2 g 200 mL/hr over 30 Minutes Intravenous Every 24 hours 04/22/24 1707     04/22/24 1630  azithromycin (ZITHROMAX) 500 mg in sodium chloride  0.9 % 250 mL IVPB        500 mg 250 mL/hr over 60 Minutes Intravenous  Once 04/22/24 1622 04/22/24 1807   04/22/24 1530  cefTRIAXone (ROCEPHIN) 1 g in sodium chloride  0.9 % 100 mL IVPB  Status:  Discontinued        1 g 200 mL/hr over 30 Minutes Intravenous  Once 04/22/24 1526 04/22/24 1527   04/22/24 1530  cefTRIAXone (ROCEPHIN) 2 g in sodium chloride  0.9 % 100 mL IVPB        2 g 200 mL/hr over 30 Minutes Intravenous  Once 04/22/24 1527 04/22/24 1658       Objective:  Vital Signs  Vitals:   04/23/24 1900 04/23/24 2000 04/23/24 2221 04/24/24 0841  BP: (!) 113/52 104/65 (!) 124/57 (!) 128/56  Pulse: 69 76 77 75  Resp: 18 (!) 23 20 18   Temp:  98.5 F (36.9 C) 98.6 F (37 C) 98.4 F (36.9 C)  TempSrc:  Axillary Oral  Oral  SpO2: 92% 96% 94% 92%  Weight:      Height:        Intake/Output Summary (Last 24 hours) at 04/24/2024 0917 Last data filed at 04/23/2024 2050 Gross per 24 hour  Intake 500.52 ml  Output 400 ml  Net 100.52 ml   Filed Weights   04/22/24 1512  Weight: 57.2 kg    General appearance: Awake alert.  In no distress Resp: Crackles right base.  No wheezing or rhonchi.  Normal effort at rest Cardio: S1-S2 is normal regular.  No S3-S4.  No rubs murmurs or bruit GI: Abdomen is soft.  Nontender nondistended.  Bowel sounds are present normal.  No masses organomegaly Extremities: No edema.  Full range of motion of lower extremities.  Physical deconditioning is noted Neurologic: No focal neurological deficits.    Lab Results:  Data Reviewed: I have personally reviewed following labs and reports of the imaging studies  CBC: Recent Labs  Lab 04/22/24 1436 04/22/24 1552 04/23/24 0528 04/24/24 0444  WBC 19.3*  --  20.3* 13.5*  NEUTROABS 17.8*  --   --   --   HGB 12.1* 12.2* 10.3* 10.6*  HCT 37.0* 36.0* 30.6* 31.7*  MCV 93.4  --  91.1 91.1  PLT 208  --  178 164    Basic Metabolic Panel: Recent Labs  Lab 04/22/24 1436 04/22/24 1552 04/23/24 0528 04/24/24 0444  NA 137 137 137 132*  K 4.2 4.1 4.3 4.1  CL 100 99 104 104  CO2 23  --  24 22  GLUCOSE 97 87 81 82  BUN 19 20 18 14   CREATININE 1.78* 1.70* 1.17 0.98  CALCIUM  9.3  --  8.9 8.9  MG  --   --  1.2* 2.2  PHOS  --   --  2.5 2.3*    GFR: Estimated Creatinine Clearance: 43 mL/min (by C-G formula based on SCr of 0.98 mg/dL).  Liver Function Tests: Recent Labs  Lab 04/22/24 1436  AST 92*  ALT 21  ALKPHOS 48  BILITOT 0.9  PROT 6.0*  ALBUMIN 3.1*    Coagulation Profile: Recent Labs  Lab 04/22/24 1436  INR 1.1    Thyroid  Function Tests: Recent Labs    04/22/24 2152  TSH 2.015    Recent Results (from the past 240 hours)  Blood Culture (routine x 2)     Status: None (Preliminary result)   Collection  Time: 04/22/24  2:37 PM   Specimen: BLOOD  Result Value Ref Range Status   Specimen Description BLOOD LEFT ANTECUBITAL  Final   Special Requests   Final    BOTTLES DRAWN AEROBIC AND ANAEROBIC Blood Culture results may not be optimal due to an inadequate volume of blood received in culture  bottles   Culture   Final    NO GROWTH 2 DAYS Performed at [facility], [address], [place], [place] [address]    Report Status PENDING  Incomplete  Blood Culture (routine x 2)     Status: None (Preliminary result)   Collection Time: 04/22/24  2:41 PM   Specimen: BLOOD  Result Value Ref Range Status   Specimen Description BLOOD RIGHT ANTECUBITAL  Final   Special Requests   Final    BOTTLES DRAWN AEROBIC AND ANAEROBIC Blood Culture adequate volume   Culture   Final    NO GROWTH 2 DAYS Performed at [facility], [address], [place], [place] [address]    Report Status PENDING  Incomplete  Resp panel by RT-PCR (RSV, Flu A&B, Covid) Anterior Nasal Swab     Status: None   Collection Time: 04/22/24  3:15 PM   Specimen: Anterior Nasal Swab  Result Value Ref Range Status   SARS Coronavirus 2 by RT PCR NEGATIVE NEGATIVE Final   Influenza A by PCR NEGATIVE NEGATIVE Final   Influenza B by PCR NEGATIVE NEGATIVE Final    Comment: (NOTE) The Xpert Xpress SARS-CoV-2/FLU/RSV plus assay is intended as an aid in the diagnosis of influenza from Nasopharyngeal swab specimens and should not be used as a sole basis for treatment. Nasal washings and aspirates are unacceptable for Xpert Xpress SARS-CoV-2/FLU/RSV testing.  Fact Sheet for Patients: bloggercourse.com  Fact Sheet for Healthcare Providers: seriousbroker.it  This test is not yet approved or cleared by the [place]  FDA and has been authorized for detection and/or diagnosis of SARS-CoV-2 by FDA under an Emergency Use Authorization (EUA). This EUA will remain in effect (meaning  this test can be used) for the duration of the COVID-19 declaration under Section 564(b)(1) of the Act, 21 U.S.C. section 360bbb-3(b)(1), unless the authorization is terminated or revoked.     Resp Syncytial Virus by PCR NEGATIVE NEGATIVE Final    Comment: (NOTE) Fact Sheet for Patients: bloggercourse.com  Fact Sheet for Healthcare Providers: seriousbroker.it  This test is not yet approved or cleared by the [place]  FDA and has been authorized for detection and/or diagnosis of SARS-CoV-2 by FDA under an Emergency Use Authorization (EUA). This EUA will remain in effect (meaning this test can be used) for the duration of the COVID-19 declaration under Section 564(b)(1) of the Act, 21 U.S.C. section 360bbb-3(b)(1), unless the authorization is terminated or revoked.  Performed at [facility], [address], [place], [place] [address]   MRSA Next Gen by PCR, Nasal     Status: None   Collection Time: 04/22/24  6:26 PM   Specimen: Nasal Mucosa; Nasal Swab  Result Value Ref Range Status   MRSA by PCR Next Gen NOT DETECTED NOT DETECTED Final    Comment: (NOTE) The GeneXpert MRSA Assay (FDA approved for NASAL specimens only), is one component of a comprehensive MRSA colonization surveillance program. It is not intended to diagnose MRSA infection nor to guide or monitor treatment for MRSA infections. Test performance is not FDA approved in patients less than 33 years old. Performed at [facility], [address] [place], [place] [address]   Respiratory (~20 pathogens) panel by PCR     Status: None   Collection Time: 04/22/24  7:32 PM   Specimen: Nasopharyngeal Swab; Respiratory  Result Value Ref Range Status   Adenovirus NOT DETECTED NOT DETECTED Final   Coronavirus 229E NOT DETECTED NOT  DETECTED Final    Comment: (NOTE) The Coronavirus on the Respiratory Panel, DOES NOT test for the novel  Coronavirus (2019  nCoV)    Coronavirus HKU1 NOT DETECTED NOT DETECTED Final   Coronavirus NL63 NOT DETECTED NOT DETECTED Final   Coronavirus OC43 NOT DETECTED NOT DETECTED Final   Metapneumovirus NOT DETECTED NOT DETECTED Final   Rhinovirus / Enterovirus NOT DETECTED NOT DETECTED Final   Influenza A NOT DETECTED NOT DETECTED Final   Influenza B NOT DETECTED NOT DETECTED Final   Parainfluenza Virus 1 NOT DETECTED NOT DETECTED Final   Parainfluenza Virus 2 NOT DETECTED NOT DETECTED Final   Parainfluenza Virus 3 NOT DETECTED NOT DETECTED Final   Parainfluenza Virus 4 NOT DETECTED NOT DETECTED Final   Respiratory Syncytial Virus NOT DETECTED NOT DETECTED Final   Bordetella pertussis NOT DETECTED NOT DETECTED Final   Bordetella Parapertussis NOT DETECTED NOT DETECTED Final   Chlamydophila pneumoniae NOT DETECTED NOT DETECTED Final   Mycoplasma pneumoniae NOT DETECTED NOT DETECTED Final    Comment: Performed at [facility], [address], [place], [place] [address]      Radiology Studies: DG Chest Port 1 View Result Date: 04/22/2024 EXAM: 1 VIEW(S) XRAY OF THE CHEST 04/22/2024 05:35:24 PM COMPARISON: Chest x-ray 01/29/2023. CLINICAL HISTORY: Questionable sepsis - evaluate for abnormality. FINDINGS: LUNGS AND PLEURA: There are patchy right lower lobe airspace opacities worrisome for pneumonia. No pulmonary edema. No pleural effusion. No pneumothorax. HEART AND MEDIASTINUM: No acute abnormality of the cardiac and mediastinal silhouettes. BONES AND SOFT TISSUES: No acute osseous abnormality. IMPRESSION: 1. Patchy right lower lobe airspace opacities, worrisome for pneumonia. Electronically signed by: Greig Pique MD 04/22/2024 06:06 PM EST RP Workstation: [id number]       LOS: 2 days   Joette Pebbles  [facility] Pager on [email or web address]  04/24/2024, 9:17 AM

## 2024-04-24 NOTE — Evaluation (Signed)
 Physical Therapy Evaluation Patient Details Name: Colin Larson MRN: [id number] DOB: November 29, 1934 Today's Date: 04/24/2024  History of Present Illness  Colin Larson is a 89 y.o. male who presented to urgent care on 11/7 with fever to 101,chills, weakness; Was noted to be hypotensive with systolic in the 70s; therefore, he was sent to the ED for further evaluation; productive cough, green stuff in the preceding days; working diagnosis of Septic shock secondary to RLL CAP; PMH includes Elevated LFTs; Dizziness; Bradycardia; COVID-19 virus infection; Hyponatremia; Lactic acidosis; and Shock (HCC), vertigo, CAD,  Clinical Impression   Pt admitted with above diagnosis. Lives at home with spouse, in a single-level home with a few steps to enter, and a ramp; Prior to admission, pt was able to manage in the home independently, and uses a rollator when going outside; reports two recent falls; Presents to PT with generalized weakness, and decr functional endurance; Needed up to min assist for bed mobility, and transfers; walked approx 50 ft with RW on room air, and O2 sats ranged 87-92%; no dizziness reported with transitions from supine to sit to standing, though notable decr in SBPat 3 minutes standing -- no reports of presyncopal symptoms; see vitals flowsheets; noting slower head turns and loss of visual target with VOR x1 exercises; will ask for more detailed vestibular assessment next session;  Anticipate a good functional recovery as he improves medically;  Pt currently with functional limitations due to the deficits listed below (see PT Problem List). Pt will benefit from skilled PT to increase their independence and safety with mobility to allow discharge to the venue listed below.           If plan is discharge home, recommend the following: Assist for transportation   Can travel by private vehicle        Equipment Recommendations None recommended by PT  Recommendations for Other  Services  Other (comment) (a closer look at vestibular funciton while inpatient)    Functional Status Assessment Patient has had a recent decline in their functional status and demonstrates the ability to make significant improvements in function in a reasonable and predictable amount of time.     Precautions / Restrictions Precautions Precautions: Fall Restrictions Weight Bearing Restrictions Per Provider Order: No      Mobility  Bed Mobility Overal bed mobility: Needs Assistance Bed Mobility: Supine to Sit     Supine to sit: Min assist     General bed mobility comments: Min handheld assist to pull to sit    Transfers Overall transfer level: Needs assistance Equipment used: Rolling walker (2 wheels) Transfers: Sit to/from Stand Sit to Stand: Contact guard assist           General transfer comment: CGA for safety    Ambulation/Gait Ambulation/Gait assistance: Contact guard assist Gait Distance (Feet): 50 Feet Assistive device: Rolling walker (2 wheels) Gait Pattern/deviations: Step-through pattern       General Gait Details: mildly unsteady, but good use of RW for UE support; walked on room air, and O2 satrs ranged 87%-92%  Stairs            Wheelchair Mobility     Tilt Bed    Modified Rankin (Stroke Patients Only)       Balance     Sitting balance-Leahy Scale: Good       Standing balance-Leahy Scale: Fair  Pertinent Vitals/Pain Pain Assessment Pain Assessment: No/denies pain    Home Living Family/patient expects to be discharged to:: Private residence Living Arrangements: Spouse/significant other Available Help at Discharge: Family;Available 24 hours/day Type of Home: House Home Access: Stairs to enter Entrance Stairs-Rails: Right;Left;Can reach both Entrance Stairs-Number of Steps: 4   Home Layout: One level Home Equipment: Rollator (4 wheels);Shower seat      Prior Function Prior  Level of Function : Independent/Modified Independent             Mobility Comments: 4WRW outside. ADLs Comments: Has a shower seat if needed.  Wants to have grabbars installed.  Continues to be Mod I for ADL, toileting.  Generally orders food, but son assists with groceries.     Extremity/Trunk Assessment   Upper Extremity Assessment Upper Extremity Assessment: Defer to OT evaluation    Lower Extremity Assessment Lower Extremity Assessment: Generalized weakness    Cervical / Trunk Assessment Cervical / Trunk Assessment: Normal  Communication   Communication Communication: Impaired Factors Affecting Communication: Hearing impaired    Cognition Arousal: Alert Behavior During Therapy: WFL for tasks assessed/performed                             Following commands: Intact       Cueing Cueing Techniques: Verbal cues     General Comments General comments (skin integrity, edema, etc.): Noting history of vertigo; tried VOR x1 exercise, and noted very slow head turns and tendency to lose target with L head turn; pt did not report incr dizziness    Exercises     Assessment/Plan    PT Assessment Patient needs continued PT services  PT Problem List Decreased strength;Decreased activity tolerance;Decreased balance;Decreased mobility;Decreased coordination;Decreased knowledge of use of DME;Decreased knowledge of precautions       PT Treatment Interventions DME instruction;Gait training;Stair training;Functional mobility training;Therapeutic activities;Therapeutic exercise;Balance training;Neuromuscular re-education;Cognitive remediation;Patient/family education;Manual techniques    PT Goals (Current goals can be found in the Care Plan section)  Acute Rehab PT Goals Patient Stated Goal: home soon; back to regularly walking the track PT Goal Formulation: With patient Time For Goal Achievement: 05/08/24 Potential to Achieve Goals: Good    Frequency Min  2X/week     Co-evaluation               AM-PAC PT 6 Clicks Mobility  Outcome Measure Help needed turning from your back to your side while in a flat bed without using bedrails?: None Help needed moving from lying on your back to sitting on the side of a flat bed without using bedrails?: A Little Help needed moving to and from a bed to a chair (including a wheelchair)?: A Little Help needed standing up from a chair using your arms (e.g., wheelchair or bedside chair)?: A Little Help needed to walk in hospital room?: A Little Help needed climbing 3-5 steps with a railing? : A Little 6 Click Score: 19    End of Session Equipment Utilized During Treatment: Gait belt Activity Tolerance: Patient tolerated treatment well Patient left: in chair;with call bell/phone within reach;with chair alarm set Nurse Communication: Mobility status PT Visit Diagnosis: Unsteadiness on feet (R26.81);Muscle weakness (generalized) (M62.81);Repeated falls (R29.6);History of falling (Z91.81)    Time: 8960-8879 PT Time Calculation (min) (ACUTE ONLY): 41 min   Charges:   PT Evaluation $PT Eval Low Complexity: 1 Low PT Treatments $Gait Training: 8-22 mins $Therapeutic Activity: 8-22 mins PT General Charges $$  ACUTE PT VISIT: 1 Visit         Silvano Currier, PT  Acute Rehabilitation Services Office [phone] Secure Chat welcomed   Silvano VEAR Currier 04/24/2024, 3:59 PM

## 2024-04-24 NOTE — Progress Notes (Addendum)
 Called respiratory for cont pulse ox, they said to call portable equipment. Called no answer. Will try again

## 2024-04-24 NOTE — Progress Notes (Signed)
 Foley catheter removed.  Patient tolerated procedure well. Blood clot noted post removal. Peri care completed. Patient remains in recliner. Call light within reach. Sherline Jubilee

## 2024-04-25 DIAGNOSIS — A419 Sepsis, unspecified organism: Secondary | ICD-10-CM | POA: Diagnosis not present

## 2024-04-25 DIAGNOSIS — J189 Pneumonia, unspecified organism: Secondary | ICD-10-CM | POA: Diagnosis not present

## 2024-04-25 LAB — CBC
HCT: 28 % — ABNORMAL LOW (ref 39.0–52.0)
Hemoglobin: 9.5 g/dL — ABNORMAL LOW (ref 13.0–17.0)
MCH: 30.4 pg (ref 26.0–34.0)
MCHC: 33.9 g/dL (ref 30.0–36.0)
MCV: 89.7 fL (ref 80.0–100.0)
Platelets: 182 K/uL (ref 150–400)
RBC: 3.12 MIL/uL — ABNORMAL LOW (ref 4.22–5.81)
RDW: 14.8 % (ref 11.5–15.5)
WBC: 8.9 K/uL (ref 4.0–10.5)
nRBC: 0 % (ref 0.0–0.2)

## 2024-04-25 LAB — IRON AND TIBC
Iron: 17 ug/dL — ABNORMAL LOW (ref 45–182)
Saturation Ratios: 9 % — ABNORMAL LOW (ref 17.9–39.5)
TIBC: 196 ug/dL — ABNORMAL LOW (ref 250–450)
UIBC: 179 ug/dL

## 2024-04-25 LAB — BASIC METABOLIC PANEL WITH GFR
Anion gap: 10 (ref 5–15)
BUN: 16 mg/dL (ref 8–23)
CO2: 23 mmol/L (ref 22–32)
Calcium: 8.7 mg/dL — ABNORMAL LOW (ref 8.9–10.3)
Chloride: 103 mmol/L (ref 98–111)
Creatinine, Ser: 0.98 mg/dL (ref 0.61–1.24)
GFR, Estimated: >60 mL/min (ref >60)
Glucose, Bld: 87 mg/dL (ref 70–99)
Potassium: 3.7 mmol/L (ref 3.5–5.1)
Sodium: 136 mmol/L (ref 135–145)

## 2024-04-25 LAB — FOLATE: Folate: 13 ng/mL (ref >5.9)

## 2024-04-25 LAB — FERRITIN: Ferritin: 97 ng/mL (ref 24–336)

## 2024-04-25 LAB — VITAMIN B12: Vitamin B-12: 198 pg/mL (ref 180–914)

## 2024-04-25 LAB — RETICULOCYTES
Immature Retic Fract: 14.3 % (ref 2.3–15.9)
RBC.: 3.15 MIL/uL — ABNORMAL LOW (ref 4.22–5.81)
Retic Count, Absolute: 31.2 K/uL (ref 19.0–186.0)
Retic Ct Pct: 1 % (ref 0.4–3.1)

## 2024-04-25 LAB — PHOSPHORUS: Phosphorus: 3.8 mg/dL (ref 2.5–4.6)

## 2024-04-25 LAB — LEGIONELLA PNEUMOPHILA SEROGP 1 UR AG: L. pneumophila Serogp 1 Ur Ag: NEGATIVE

## 2024-04-25 MED ORDER — POTASSIUM CHLORIDE CRYS ER 20 MEQ PO TBCR
40.0000 meq | EXTENDED_RELEASE_TABLET | Freq: Once | ORAL | Status: AC
  Administered 2024-04-25: 40 meq via ORAL
  Filled 2024-04-25: qty 2

## 2024-04-25 MED ORDER — AZITHROMYCIN 500 MG PO TABS
500.0000 mg | ORAL_TABLET | Freq: Every day | ORAL | Status: DC
  Administered 2024-04-25 – 2024-04-26 (×2): 500 mg via ORAL
  Filled 2024-04-25 (×2): qty 1

## 2024-04-25 MED ORDER — VITAMIN B-12 1000 MCG PO TABS
1000.0000 ug | ORAL_TABLET | Freq: Every day | ORAL | Status: DC
  Administered 2024-04-25 – 2024-04-26 (×2): 1000 ug via ORAL
  Filled 2024-04-25 (×2): qty 1

## 2024-04-25 NOTE — TOC Initial Note (Signed)
 Transition of Care [facility]) - Initial/Assessment Note    Patient Details  Name: Colin Larson MRN: [id number] Date of Birth: 1934-12-11  [facility]) CM/SW Contact:    Roxie KANDICE Stain, RN Phone Number: 04/25/2024, 3:25 PM  Clinical Narrative:                 Spoke to patient and wife regarding transition needs. Patient has used [facility] in the past and is agreeable to use them again. Darleene with [facility] accepted referral. Patient qualifies for home 02. Notified Jeramine with [facility] of order. Patient's daughter in law can assist with transportation until patient is strong enough to drive again. Patient's son delivers groceries on Saturdays.  Zachary G Winne is stable to discharge home. Follow up apt on AVS. No ICM (Inpatient Care Management) needs at this time.  Expected Discharge Plan: Home w Home Health Services     Patient Goals and CMS Choice Patient states their goals for this hospitalization and ongoing recovery are:: return home CMS Medicare.gov Compare Post Acute Care list provided to:: Patient Represenative (must comment) Choice offered to / list presented to : Spouse      Expected Discharge Plan and Services   Discharge Planning Services: CM Consult Post Acute Care Choice: Home Health, Durable Medical Equipment Living arrangements for the past 2 months: Single Family Home                 DME Arranged: Oxygen DME Agency: [employer] Date DME Agency Contacted: 04/25/24 Time DME Agency Contacted: 1524 Representative spoke with at DME Agency: [place] HH Arranged: PT, OT, RN HH Agency: [facility] Date [facility] Agency Contacted: 04/25/24 Time HH Agency Contacted: 1525 Representative spoke with at [facility] Agency: Darleene  Prior Living Arrangements/Services Living arrangements for the past 2 months: Single Family Home Lives with:: Spouse Patient language and need for interpreter reviewed:: Yes Do you feel safe going back to the place where you  live?: Yes      Need for Family Participation in Patient Care: Yes (Comment) Care giver support system in place?: Yes (comment) Current home services: DME Criminal Activity/Legal Involvement Pertinent to Current Situation/Hospitalization: No - Comment as needed  Activities of Daily Living   ADL Screening (condition at time of admission) Independently performs ADLs?: No Does the patient have a NEW difficulty with bathing/dressing/toileting/self-feeding that is expected to last >3 days?: Yes (Initiates electronic notice to provider for possible OT consult) Does the patient have a NEW difficulty with getting in/out of bed, walking, or climbing stairs that is expected to last >3 days?: Yes (Initiates electronic notice to provider for possible PT consult) Does the patient have a NEW difficulty with communication that is expected to last >3 days?: No Is the patient deaf or have difficulty hearing?: Yes Does the patient have difficulty seeing, even when wearing glasses/contacts?: Yes Does the patient have difficulty concentrating, remembering, or making decisions?: No  Permission Sought/Granted                  Emotional Assessment Appearance:: Appears stated age Attitude/Demeanor/Rapport: Engaged Affect (typically observed): Accepting Orientation: : Oriented to Self, Oriented to Place, Oriented to  Time, Oriented to Situation Alcohol / Substance Use: Not Applicable Psych Involvement: No (comment)  Admission diagnosis:  Shock [facility]) [R57.9] Patient Active Problem List   Diagnosis Date Noted   Shock (HCC) 04/22/2024   COVID-19 virus infection 12/02/2020   Hyponatremia 12/02/2020   Lactic acidosis 12/02/2020   Bradycardia  12/01/2017   Dizziness 11/30/2017   Elevated LFTs 08/24/2017   Unilateral primary osteoarthritis, right knee 02/25/2017   Chronic pain of right knee 02/25/2017   Unilateral primary osteoarthritis, left knee 02/25/2017   History of anemia 07/30/2016   Carotid  artery disease    GI bleeding    Mild aortic stenosis    Mild mitral regurgitation    Mixed hyperlipidemia 05/04/2013   GERD (gastroesophageal reflux disease) 05/04/2013   Diverticulosis of colon with hemorrhage 05/04/2013   Coronary artery disease involving native coronary artery of native heart without angina pectoris 05/04/2013   Crohn disease (HCC) 05/04/2013   Melanoma (HCC) 01/07/2012   Essential hypertension 12/29/2011   PCP:  [name], MD Pharmacy:   [facility] - [place], [place] - [address] [place] [address] Phone: [phone] Fax: [phone]  [facility] [address] [place] - [address] [place] [address] Phone: [phone] Fax: [phone]     Social Drivers of Health (SDOH) Social History: SDOH Screenings   Food Insecurity: No Food Insecurity (04/24/2024)  Housing: Low Risk  (04/24/2024)  Transportation Needs: No Transportation Needs (04/24/2024)  Utilities: Not At Risk (04/24/2024)  Social Connections: Moderately Integrated (04/24/2024)  Tobacco Use: Medium Risk (04/22/2024)   SDOH Interventions:     Readmission Risk Interventions     No data to display

## 2024-04-25 NOTE — Progress Notes (Signed)
 SATURATION QUALIFICATIONS: (This note is used to comply with regulatory documentation for home oxygen)  Patient Saturations on Room Air at Rest = 93%  Patient Saturations on Room Air while Ambulating = 85%  Patient Saturations on 2 Liters of oxygen while Ambulating = 92%  Please briefly explain why patient needs home oxygen: Pt has pneumonia

## 2024-04-25 NOTE — Progress Notes (Signed)
 [facility] PROGRESS NOTE   Colin Larson FMW:[id number] DOB: 12/25/1934 DOA: 04/22/2024  PCP: [name], MD  Brief History: 89 y.o. male with past medical history of coronary artery disease, chron's disease, mild aortic stenosis who presented to urgent care on 11/7 with fever to 101 earlier that morning, chills, weakness.  While at urgent care he was noted to be hypotensive with systolic in the 70s; therefore, he was sent to the ED for further evaluation.  Patient was noted to be hypotensive in the emergency department.  He was found to have pneumonia.  He was initially hospitalized to the ICU.  He required pressors.  He was stabilized and then transferred to the floor.    Consultants: None  Procedures: None   Subjective/Interval History: Patient mentioned that he is feeling better.  Shortness of breath is improved.  Ambulated with physical therapy yesterday.  Has been requiring oxygen on and off for the last 24 hours.  Has urinated after Foley catheter was removed yesterday.      Assessment/Plan:  Septic shock secondary to pneumonia Patient required pressors in the ICU.  Stabilized.  He is normotensive.  Antihypertensives are on hold.  Community-acquired pneumonia Chest x-ray revealed infiltrate in the right lower lobe.  Patient currently on ceftriaxone and azithromycin.  Respiratory status is noted to be stable. WBC has improved.  Procalcitonin was significantly elevated at 30.37.  Blood cultures without any growth so far.  Lactic acidosis has improved. COVID-19, influenza and RSV PCR negative.  Respiratory viral panel negative. Will plan for at least 7-day course of antibiotics.  5 days of azithromycin should suffice.  Anticipate change to cefpodoxime or cefdinir at discharge. Still requiring oxygen on and off.  Will check ambulatory pulse oximetry.  Incentive spirometry will be encouraged.  Hypothyroidism Continue levothyroxine.  TSH is  normal.  Hyponatremia/hypophosphatemia Supplement phosphorus.  Monitor sodium levels daily for now.    Normocytic anemia/vitamin B12 deficiency No evidence of overt bleeding.   Anemia panel shows ferritin of 97, iron of 17, TIBC 196, percent saturation 9.  Folic acid 13.0.  Vitamin B12 is 198.  Will start vitamin B12 supplementation check labs daily for now.    Foley catheter was removed on 11/9.  Patient has been able to void.  Continue Flomax for now.  DVT Prophylaxis: Subcutaneous heparin Code Status: Full code Family Communication: No family at bedside Disposition Plan: Home health recommended by PT.     Medications: Scheduled:  aspirin  EC  81 mg Oral Daily   atorvastatin   80 mg Oral Daily   balsalazide  2,250 mg Oral BID   Chlorhexidine Gluconate Cloth  6 each Topical Daily   vitamin B-12  1,000 mcg Oral Daily   donepezil  5 mg Oral QHS   feeding supplement  237 mL Oral BID BM   heparin  5,000 Units Subcutaneous Q8H   levothyroxine  50 mcg Oral QAC breakfast   loperamide  2 mg Oral Daily   pantoprazole   40 mg Oral Daily   pneumococcal 20-valent conjugate vaccine  0.5 mL Intramuscular Tomorrow-1000   tamsulosin  0.4 mg Oral Daily   Continuous:  azithromycin 500 mg (04/24/24 1751)   cefTRIAXone (ROCEPHIN)  IV 2 g (04/24/24 1550)   PRN:docusate sodium, meclizine, mouth rinse, polyethylene glycol  Antibiotics: Anti-infectives (From admission, onward)    Start     Dose/Rate Route Frequency Ordered Stop   04/23/24 1600  azithromycin (ZITHROMAX) 500 mg in sodium chloride  0.9 % 250 mL  IVPB        500 mg 250 mL/hr over 60 Minutes Intravenous Every 24 hours 04/22/24 1707     04/23/24 1600  cefTRIAXone (ROCEPHIN) 2 g in sodium chloride  0.9 % 100 mL IVPB        2 g 200 mL/hr over 30 Minutes Intravenous Every 24 hours 04/22/24 1707     04/22/24 1630  azithromycin (ZITHROMAX) 500 mg in sodium chloride  0.9 % 250 mL IVPB        500 mg 250 mL/hr over 60 Minutes Intravenous   Once 04/22/24 1622 04/22/24 1807   04/22/24 1530  cefTRIAXone (ROCEPHIN) 1 g in sodium chloride  0.9 % 100 mL IVPB  Status:  Discontinued        1 g 200 mL/hr over 30 Minutes Intravenous  Once 04/22/24 1526 04/22/24 1527   04/22/24 1530  cefTRIAXone (ROCEPHIN) 2 g in sodium chloride  0.9 % 100 mL IVPB        2 g 200 mL/hr over 30 Minutes Intravenous  Once 04/22/24 1527 04/22/24 1658       Objective:  Vital Signs  Vitals:   04/24/24 1630 04/24/24 2000 04/24/24 2300 04/25/24 0826  BP: 91/64 108/69  113/66  Pulse: 78 83  75  Resp: 20 18  19   Temp: 98.7 F (37.1 C) 98 F (36.7 C)  97.9 F (36.6 C)  TempSrc: Oral Oral  Oral  SpO2: 95% 90% 95% 96%  Weight:      Height:        Intake/Output Summary (Last 24 hours) at 04/25/2024 1006 Last data filed at 04/25/2024 0800 Gross per 24 hour  Intake 120 ml  Output 600 ml  Net -480 ml   Filed Weights   04/22/24 1512  Weight: 57.2 kg    General appearance: Awake alert.  In no distress Resp: Diminished air entry at the bases.  Normal effort.  Few crackles.  No wheezing or rhonchi. Cardio: S1-S2 is normal regular.  No S3-S4.  No rubs murmurs or bruit GI: Abdomen is soft.  Nontender nondistended.  Bowel sounds are present normal.  No masses organomegaly Extremities: No edema.  Full range of motion of lower extremities. Neurologic: Alert and oriented x3.  No focal neurological deficits.     Lab Results:  Data Reviewed: I have personally reviewed following labs and reports of the imaging studies  CBC: Recent Labs  Lab 04/22/24 1436 04/22/24 1552 04/23/24 0528 04/24/24 0444 04/25/24 0452  WBC 19.3*  --  20.3* 13.5* 8.9  NEUTROABS 17.8*  --   --   --   --   HGB 12.1* 12.2* 10.3* 10.6* 9.5*  HCT 37.0* 36.0* 30.6* 31.7* 28.0*  MCV 93.4  --  91.1 91.1 89.7  PLT 208  --  178 164 182    Basic Metabolic Panel: Recent Labs  Lab 04/22/24 1436 04/22/24 1552 04/23/24 0528 04/24/24 0444 04/25/24 0452  NA 137 137 137 132*  136  K 4.2 4.1 4.3 4.1 3.7  CL 100 99 104 104 103  CO2 23  --  24 22 23   GLUCOSE 97 87 81 82 87  BUN 19 20 18 14 16   CREATININE 1.78* 1.70* 1.17 0.98 0.98  CALCIUM  9.3  --  8.9 8.9 8.7*  MG  --   --  1.2* 2.2  --   PHOS  --   --  2.5 2.3* 3.8    GFR: Estimated Creatinine Clearance: 43 mL/min (by C-G formula based on SCr of  0.98 mg/dL).  Liver Function Tests: Recent Labs  Lab 04/22/24 1436  AST 92*  ALT 21  ALKPHOS 48  BILITOT 0.9  PROT 6.0*  ALBUMIN 3.1*    Coagulation Profile: Recent Labs  Lab 04/22/24 1436  INR 1.1    Thyroid  Function Tests: Recent Labs    04/22/24 2152  TSH 2.015    Recent Results (from the past 240 hours)  Blood Culture (routine x 2)     Status: None (Preliminary result)   Collection Time: 04/22/24  2:37 PM   Specimen: BLOOD  Result Value Ref Range Status   Specimen Description BLOOD LEFT ANTECUBITAL  Final   Special Requests   Final    BOTTLES DRAWN AEROBIC AND ANAEROBIC Blood Culture results may not be optimal due to an inadequate volume of blood received in culture bottles   Culture   Final    NO GROWTH 3 DAYS Performed at [facility], [address], [place], [place] [address]    Report Status PENDING  Incomplete  Blood Culture (routine x 2)     Status: None (Preliminary result)   Collection Time: 04/22/24  2:41 PM   Specimen: BLOOD  Result Value Ref Range Status   Specimen Description BLOOD RIGHT ANTECUBITAL  Final   Special Requests   Final    BOTTLES DRAWN AEROBIC AND ANAEROBIC Blood Culture adequate volume   Culture   Final    NO GROWTH 3 DAYS Performed at [facility], [address], [place], [place] [address]    Report Status PENDING  Incomplete  Resp panel by RT-PCR (RSV, Flu A&B, Covid) Anterior Nasal Swab     Status: None   Collection Time: 04/22/24  3:15 PM   Specimen: Anterior Nasal Swab  Result Value Ref Range Status   SARS Coronavirus 2 by RT PCR NEGATIVE NEGATIVE Final   Influenza A by PCR  NEGATIVE NEGATIVE Final   Influenza B by PCR NEGATIVE NEGATIVE Final    Comment: (NOTE) The Xpert Xpress SARS-CoV-2/FLU/RSV plus assay is intended as an aid in the diagnosis of influenza from Nasopharyngeal swab specimens and should not be used as a sole basis for treatment. Nasal washings and aspirates are unacceptable for Xpert Xpress SARS-CoV-2/FLU/RSV testing.  Fact Sheet for Patients: bloggercourse.com  Fact Sheet for Healthcare Providers: seriousbroker.it  This test is not yet approved or cleared by the [place]  FDA and has been authorized for detection and/or diagnosis of SARS-CoV-2 by FDA under an Emergency Use Authorization (EUA). This EUA will remain in effect (meaning this test can be used) for the duration of the COVID-19 declaration under Section 564(b)(1) of the Act, 21 U.S.C. section 360bbb-3(b)(1), unless the authorization is terminated or revoked.     Resp Syncytial Virus by PCR NEGATIVE NEGATIVE Final    Comment: (NOTE) Fact Sheet for Patients: bloggercourse.com  Fact Sheet for Healthcare Providers: seriousbroker.it  This test is not yet approved or cleared by the [place]  FDA and has been authorized for detection and/or diagnosis of SARS-CoV-2 by FDA under an Emergency Use Authorization (EUA). This EUA will remain in effect (meaning this test can be used) for the duration of the COVID-19 declaration under Section 564(b)(1) of the Act, 21 U.S.C. section 360bbb-3(b)(1), unless the authorization is terminated or revoked.  Performed at [facility], [address] [place], [place] [address]   MRSA Next Gen by PCR, Nasal     Status: None   Collection Time: 04/22/24  6:26  PM   Specimen: Nasal Mucosa; Nasal Swab  Result Value Ref Range Status   MRSA by PCR Next Gen NOT DETECTED NOT DETECTED Final    Comment: (NOTE) The GeneXpert MRSA Assay  (FDA approved for NASAL specimens only), is one component of a comprehensive MRSA colonization surveillance program. It is not intended to diagnose MRSA infection nor to guide or monitor treatment for MRSA infections. Test performance is not FDA approved in patients less than 87 years old. Performed at [facility], [address], [place], [place] [address]   Respiratory (~20 pathogens) panel by PCR     Status: None   Collection Time: 04/22/24  7:32 PM   Specimen: Nasopharyngeal Swab; Respiratory  Result Value Ref Range Status   Adenovirus NOT DETECTED NOT DETECTED Final   Coronavirus 229E NOT DETECTED NOT DETECTED Final    Comment: (NOTE) The Coronavirus on the Respiratory Panel, DOES NOT test for the novel  Coronavirus (2019 nCoV)    Coronavirus HKU1 NOT DETECTED NOT DETECTED Final   Coronavirus NL63 NOT DETECTED NOT DETECTED Final   Coronavirus OC43 NOT DETECTED NOT DETECTED Final   Metapneumovirus NOT DETECTED NOT DETECTED Final   Rhinovirus / Enterovirus NOT DETECTED NOT DETECTED Final   Influenza A NOT DETECTED NOT DETECTED Final   Influenza B NOT DETECTED NOT DETECTED Final   Parainfluenza Virus 1 NOT DETECTED NOT DETECTED Final   Parainfluenza Virus 2 NOT DETECTED NOT DETECTED Final   Parainfluenza Virus 3 NOT DETECTED NOT DETECTED Final   Parainfluenza Virus 4 NOT DETECTED NOT DETECTED Final   Respiratory Syncytial Virus NOT DETECTED NOT DETECTED Final   Bordetella pertussis NOT DETECTED NOT DETECTED Final   Bordetella Parapertussis NOT DETECTED NOT DETECTED Final   Chlamydophila pneumoniae NOT DETECTED NOT DETECTED Final   Mycoplasma pneumoniae NOT DETECTED NOT DETECTED Final    Comment: Performed at [facility], [address] [place], [place] [address]      Radiology Studies: No results found.      LOS: 3 days   Clare Fennimore [employer] on [email or web address]  04/25/2024, 10:06 AM

## 2024-04-25 NOTE — Progress Notes (Signed)
 Physical Therapy Treatment Patient Details Name: Colin Larson MRN: [id number] DOB: 1934/08/24 Today's Date: 04/25/2024   History of Present Illness Colin Larson is a 89 y.o. male who presented to urgent care on 11/7 with fever to 101,chills, weakness; Was noted to be hypotensive with systolic in the 70s; therefore, he was sent to the ED for further evaluation; productive cough, green stuff in the preceding days; working diagnosis of Septic shock secondary to RLL CAP; PMH includes Elevated LFTs; Dizziness; Bradycardia; COVID-19 virus infection; Hyponatremia; Lactic acidosis; and Shock (HCC), vertigo, CAD,    PT Comments  Pt received in supine and agreeable to PT session. Today, pt supervision for bed mobility and CGA for STS transfer. Able to ambulate 150' and navigate 3 stairs with bilateral handrails at CGA level. Demonstrated good ability to navigate RW with gait and stairs, but did need VC for correct use of rollator brakes prior to sitting or standing. Without needing cueing, he demonstrated good technique by backing up rollator to stairs to grab handrails and begin stairs. Pt denied SOB throughout session and tolerated all activity well. Pt denied dizziness throughout. Smooth pursuit and VORx1 testing were negative bilaterally. Overall, pt cognition appears intact, but pt stories regarding home environment had to be clarified several times due to slight inconsistencies. Continue to recommend OP PT upon d/c to improve gait, balance, and endurance with functional activity. Acute PT to follow.     If plan is discharge home, recommend the following: Assist for transportation   Can travel by private vehicle        Equipment Recommendations  None recommended by PT    Recommendations for Other Services       Precautions / Restrictions Precautions Precautions: Fall Recall of Precautions/Restrictions: Intact Restrictions Weight Bearing Restrictions Per Provider Order: No      Mobility  Bed Mobility Overal bed mobility: Needs Assistance Bed Mobility: Supine to Sit     Supine to sit: Supervision     General bed mobility comments: Used R handrail to roll and push into sitting. No physical assist needed.    Transfers Overall transfer level: Needs assistance Equipment used: Rollator (4 wheels) Transfers: Sit to/from Stand Sit to Stand: Contact guard assist           General transfer comment: CGA for safety. Required VC for correct use of rollator brakes.    Ambulation/Gait Ambulation/Gait assistance: Contact guard assist Gait Distance (Feet): 150 Feet Assistive device: Rollator (4 wheels) Gait Pattern/deviations: Step-through pattern Gait velocity: Dec Gait velocity interpretation: <1.8 ft/sec, indicate of risk for recurrent falls   General Gait Details: No LOB or staggering noted. Good use of rollator for balance. Walked on room air, denied and did not demonstrate SOB throughout.   Stairs Stairs: Yes Stairs assistance: Contact guard assist Stair Management: Two rails, Alternating pattern, Forwards Number of Stairs: 3 General stair comments: Alternating pattern and B rails used for ascending and descending stairs. Demonstrated good technique with turning and backing up to stairs with rollator to grab onto rails without being cued to do so.   Wheelchair Mobility     Tilt Bed    Modified Rankin (Stroke Patients Only)       Balance Overall balance assessment: Needs assistance Sitting-balance support: Feet supported Sitting balance-Leahy Scale: Good     Standing balance support: Reliant on assistive device for balance, Bilateral upper extremity supported, During functional activity Standing balance-Leahy Scale: Poor  Communication Communication Communication: Impaired Factors Affecting Communication: Hearing impaired  Cognition Arousal: Alert Behavior During Therapy: WFL for tasks  assessed/performed   PT - Cognitive impairments: Other                       PT - Cognition Comments: Seemed to give conflicting stories regarding home environment and required repetitive confirmation to understand. Following commands: Intact      Cueing Cueing Techniques: Verbal cues  Exercises      General Comments General comments (skin integrity, edema, etc.): SpO2 remained at or above 91% and he denied SOB throughout session today. Denied dizziness throughout session today. Smooth pursuit and VORx1 negative bilaterally. States he had a period of dizziness that lasted about 8 days, but he has not been dizzy for several days now. Discussed seeing OP PT if dizziness resumes.      Pertinent Vitals/Pain Pain Assessment Pain Assessment: Faces Faces Pain Scale: No hurt    Home Living                          Prior Function            PT Goals (current goals can now be found in the care plan section) Acute Rehab PT Goals PT Goal Formulation: With patient Time For Goal Achievement: 05/08/24 Potential to Achieve Goals: Good Progress towards PT goals: Progressing toward goals    Frequency    Min 2X/week      PT Plan      Co-evaluation              AM-PAC PT 6 Clicks Mobility   Outcome Measure  Help needed turning from your back to your side while in a flat bed without using bedrails?: None Help needed moving from lying on your back to sitting on the side of a flat bed without using bedrails?: A Little Help needed moving to and from a bed to a chair (including a wheelchair)?: A Little Help needed standing up from a chair using your arms (e.g., wheelchair or bedside chair)?: A Little Help needed to walk in hospital room?: A Little Help needed climbing 3-5 steps with a railing? : A Little 6 Click Score: 19    End of Session Equipment Utilized During Treatment: Gait belt Activity Tolerance: Patient tolerated treatment well Patient left:  in chair;with call bell/phone within reach;with chair alarm set Nurse Communication: Mobility status PT Visit Diagnosis: Unsteadiness on feet (R26.81);Muscle weakness (generalized) (M62.81);Repeated falls (R29.6);History of falling (Z91.81)     Time: 9146-9074 PT Time Calculation (min) (ACUTE ONLY): 32 min  Charges:    $Gait Training: 23-37 mins PT General Charges $$ ACUTE PT VISIT: 1 Visit                     Amarea Macdowell, SPT    Sarajean Dessert 04/25/2024, 1:11 PM

## 2024-04-25 NOTE — Progress Notes (Signed)
 Pt ambulated to the bathroom with walker and emptied entire bladder- 300 pre void and 0 post void bladder scan

## 2024-04-26 ENCOUNTER — Ambulatory Visit: Admitting: Cardiology

## 2024-04-26 ENCOUNTER — Other Ambulatory Visit (HOSPITAL_COMMUNITY): Payer: Self-pay

## 2024-04-26 DIAGNOSIS — J189 Pneumonia, unspecified organism: Secondary | ICD-10-CM | POA: Diagnosis not present

## 2024-04-26 DIAGNOSIS — A419 Sepsis, unspecified organism: Secondary | ICD-10-CM | POA: Diagnosis not present

## 2024-04-26 LAB — CBC
HCT: 29 % — ABNORMAL LOW (ref 39.0–52.0)
Hemoglobin: 9.8 g/dL — ABNORMAL LOW (ref 13.0–17.0)
MCH: 30.3 pg (ref 26.0–34.0)
MCHC: 33.8 g/dL (ref 30.0–36.0)
MCV: 89.8 fL (ref 80.0–100.0)
Platelets: 185 K/uL (ref 150–400)
RBC: 3.23 MIL/uL — ABNORMAL LOW (ref 4.22–5.81)
RDW: 14.7 % (ref 11.5–15.5)
WBC: 5.9 K/uL (ref 4.0–10.5)
nRBC: 0 % (ref 0.0–0.2)

## 2024-04-26 LAB — BASIC METABOLIC PANEL WITH GFR
Anion gap: 7 (ref 5–15)
BUN: 9 mg/dL (ref 8–23)
CO2: 24 mmol/L (ref 22–32)
Calcium: 8.9 mg/dL (ref 8.9–10.3)
Chloride: 102 mmol/L (ref 98–111)
Creatinine, Ser: 0.85 mg/dL (ref 0.61–1.24)
GFR, Estimated: >60 mL/min (ref >60)
Glucose, Bld: 88 mg/dL (ref 70–99)
Potassium: 4.7 mmol/L (ref 3.5–5.1)
Sodium: 133 mmol/L — ABNORMAL LOW (ref 135–145)

## 2024-04-26 MED ORDER — AZITHROMYCIN 500 MG PO TABS
500.0000 mg | ORAL_TABLET | Freq: Every day | ORAL | 0 refills | Status: AC
  Filled 2024-04-26: qty 2, 2d supply, fill #0

## 2024-04-26 MED ORDER — CEFDINIR 300 MG PO CAPS
300.0000 mg | ORAL_CAPSULE | Freq: Two times a day (BID) | ORAL | 0 refills | Status: AC
  Filled 2024-04-26: qty 8, 4d supply, fill #0

## 2024-04-26 MED ORDER — CYANOCOBALAMIN 1000 MCG PO TABS
1000.0000 ug | ORAL_TABLET | Freq: Every day | ORAL | 0 refills | Status: AC
  Filled 2024-04-26: qty 90, 90d supply, fill #0

## 2024-04-26 MED ORDER — TAMSULOSIN HCL 0.4 MG PO CAPS
0.4000 mg | ORAL_CAPSULE | Freq: Every day | ORAL | 0 refills | Status: AC
  Filled 2024-04-26: qty 7, 7d supply, fill #0

## 2024-04-26 NOTE — Plan of Care (Signed)
  Problem: Education: Goal: Knowledge of General Education information will improve Description: Including pain rating scale, medication(s)/side effects and non-pharmacologic comfort measures Outcome: Progressing   Problem: Health Behavior/Discharge Planning: Goal: Ability to manage health-related needs will improve Outcome: Progressing   Problem: Activity: Goal: Risk for activity intolerance will decrease Outcome: Progressing   Problem: Nutrition: Goal: Adequate nutrition will be maintained Outcome: Progressing   Problem: Coping: Goal: Level of anxiety will decrease Outcome: Progressing   Problem: Elimination: Goal: Will not experience complications related to bowel motility Outcome: Progressing Goal: Will not experience complications related to urinary retention Outcome: Progressing   

## 2024-04-26 NOTE — TOC Transition Note (Signed)
 Transition of Care [facility]) - Discharge Note   Patient Details  Name: Colin Larson MRN: [id number] Date of Birth: [birth year]  Transition [facility] CM/SW Contact:  Marval Gell, RN Phone Number: 04/26/2024, 10:05 AM   Clinical Narrative:     Janese Fetters of [place] for today, oxygen ordered yesterday, no other TOC needs identified   Final next level of care: Home w Home Health Services Barriers to Discharge: No Barriers Identified   Patient Goals and CMS Choice Patient states their goals for this hospitalization and ongoing recovery are:: return home CMS Medicare.gov Compare Post Acute Care list provided to:: Patient Represenative (must comment) Choice offered to / list presented to : Spouse      Discharge Placement                       Discharge Plan and Services Additional resources added to the After Visit Summary for     Discharge Planning Services: CM Consult Post Acute Care Choice: Home Health, Durable Medical Equipment          DME Arranged: Oxygen DME Agency: [employer] Date DME Agency Contacted: 04/25/24 Time DME Agency Contacted: 1524 Representative spoke with at DME Agency: [place] HH Arranged: PT, OT, RN HH Agency: [facility] Date [facility] Agency Contacted: 04/26/24 Time HH Agency Contacted: 1005 Representative spoke with at [facility] Agency: [place]  Social Drivers of Health (SDOH) Interventions SDOH Screenings   Food Insecurity: No Food Insecurity (04/24/2024)  Housing: Low Risk  (04/24/2024)  Transportation Needs: No Transportation Needs (04/24/2024)  Utilities: Not At Risk (04/24/2024)  Social Connections: Moderately Integrated (04/24/2024)  Tobacco Use: Medium Risk (04/22/2024)     Readmission Risk Interventions     No data to display

## 2024-04-26 NOTE — Care Management Important Message (Signed)
 Important Message  Patient Details  Name: Colin Larson MRN: [id number] Date of Birth: 22-Apr-1935   Important Message Given:        Colin Larson 04/26/2024, 2:23 PM

## 2024-04-26 NOTE — Progress Notes (Signed)
 Mobility Specialist Progress Note:    04/26/24 0911  Mobility  Activity Ambulated with assistance (In hallway)  Level of Assistance Contact guard assist, steadying assist  Assistive Device Four wheel walker  Distance Ambulated (ft) 300 ft  Activity Response Tolerated well  Mobility Referral Yes  Mobility visit 1 Mobility  Mobility Specialist Start Time (ACUTE ONLY) [id number]  Mobility Specialist Stop Time (ACUTE ONLY) 0911  Mobility Specialist Time Calculation (min) (ACUTE ONLY) 18 min   Received pt in chair and agreeable to mobility. Pt required MinG for safety. No c/o. Returned to room without fault. Pt left in chair with alarm on. Personal belongings and call light within reach. All needs met.  Lavanda Pollack Mobility Specialist  Please contact via [employer] or  Rehab Office [phone]

## 2024-04-26 NOTE — Discharge Summary (Signed)
 [facility]  Physician Discharge Summary   Patient ID: Colin Larson MRN: [id number] DOB/AGE: Apr 23, 1935 89 y.o.  Admit date: 04/22/2024 Discharge date:   04/26/2024   PCP: [name], MD  DISCHARGE DIAGNOSES:  Community-acquired pneumonia Hypoxia Septic shock, resolved Hypothyroidism Electrolyte abnormalities Vitamin B12 deficiency Normocytic anemia  RECOMMENDATIONS FOR OUTPATIENT FOLLOW UP: Follow-up with PCP   Home Health: PT OT Equipment/Devices: Home oxygen  CODE STATUS: Full code  DISCHARGE CONDITION: fair  Diet recommendation: As before  INITIAL HISTORY: 89 y.o. male with past medical history of coronary artery disease, chron's disease, mild aortic stenosis who presented to urgent care on 11/7 with fever to 101 earlier that morning, chills, weakness. While at urgent care he was noted to be hypotensive with systolic in the 70s; therefore, he was sent to the ED for further evaluation. Patient was noted to be hypotensive in the emergency department. He was found to have pneumonia. He was initially hospitalized to the ICU. He required pressors. He was stabilized and then transferred to the floor.   HOSPITAL COURSE:   Septic shock secondary to pneumonia Patient required pressors in the ICU.  Stabilized.  May resume antihypertensives at discharge.   Community-acquired pneumonia/hypoxia Chest x-ray revealed infiltrate in the right lower lobe.  Patient currently on ceftriaxone and azithromycin.  Respiratory status is noted to be stable. WBC has improved.  Procalcitonin was significantly elevated at 30.37.  Blood cultures without any growth so far.  Lactic acidosis has improved. COVID-19, influenza and RSV PCR negative.  Respiratory viral panel negative. Will be transition to oral antibiotics at discharge. Requiring oxygen with ambulation.  Home oxygen has been prescribed.   Hypothyroidism Continue levothyroxine.  TSH is normal.    Hyponatremia/hypophosphatemia    Normocytic anemia/vitamin B12 deficiency No evidence of overt bleeding.   Anemia panel shows ferritin of 97, iron of 17, TIBC 196, percent saturation 9.  Folic acid 13.0.  Vitamin B12 is 198.  Will start vitamin B12 supplementation check labs daily for now.     Foley catheter was removed on 11/9.  Patient has been able to void.  Flomax to be taken for 1 more week.    Patient is stable.  Discussed with his wife.  Okay for discharge home today.  PERTINENT LABS:  The results of significant diagnostics from this hospitalization (including imaging, microbiology, ancillary and laboratory) are listed below for reference.    Microbiology: Recent Results (from the past 240 hours)  Blood Culture (routine x 2)     Status: None (Preliminary result)   Collection Time: 04/22/24  2:37 PM   Specimen: BLOOD  Result Value Ref Range Status   Specimen Description BLOOD LEFT ANTECUBITAL  Final   Special Requests   Final    BOTTLES DRAWN AEROBIC AND ANAEROBIC Blood Culture results may not be optimal due to an inadequate volume of blood received in culture bottles   Culture   Final    NO GROWTH 4 DAYS Performed at [facility], [address], [place], [place] [address]    Report Status PENDING  Incomplete  Blood Culture (routine x 2)     Status: None (Preliminary result)   Collection Time: 04/22/24  2:41 PM   Specimen: BLOOD  Result Value Ref Range Status   Specimen Description BLOOD RIGHT ANTECUBITAL  Final   Special Requests   Final    BOTTLES DRAWN AEROBIC AND ANAEROBIC Blood Culture adequate volume   Culture   Final    NO  GROWTH 4 DAYS Performed at [facility], [address], [place], [place] [address]    Report Status PENDING  Incomplete  Resp panel by RT-PCR (RSV, Flu A&B, Covid) Anterior Nasal Swab     Status: None   Collection Time: 04/22/24  3:15 PM   Specimen: Anterior Nasal Swab  Result Value Ref Range Status   SARS Coronavirus 2 by  RT PCR NEGATIVE NEGATIVE Final   Influenza A by PCR NEGATIVE NEGATIVE Final   Influenza B by PCR NEGATIVE NEGATIVE Final    Comment: (NOTE) The Xpert Xpress SARS-CoV-2/FLU/RSV plus assay is intended as an aid in the diagnosis of influenza from Nasopharyngeal swab specimens and should not be used as a sole basis for treatment. Nasal washings and aspirates are unacceptable for Xpert Xpress SARS-CoV-2/FLU/RSV testing.  Fact Sheet for Patients: bloggercourse.com  Fact Sheet for Healthcare Providers: seriousbroker.it  This test is not yet approved or cleared by the [place]  FDA and has been authorized for detection and/or diagnosis of SARS-CoV-2 by FDA under an Emergency Use Authorization (EUA). This EUA will remain in effect (meaning this test can be used) for the duration of the COVID-19 declaration under Section 564(b)(1) of the Act, 21 U.S.C. section 360bbb-3(b)(1), unless the authorization is terminated or revoked.     Resp Syncytial Virus by PCR NEGATIVE NEGATIVE Final    Comment: (NOTE) Fact Sheet for Patients: bloggercourse.com  Fact Sheet for Healthcare Providers: seriousbroker.it  This test is not yet approved or cleared by the [place]  FDA and has been authorized for detection and/or diagnosis of SARS-CoV-2 by FDA under an Emergency Use Authorization (EUA). This EUA will remain in effect (meaning this test can be used) for the duration of the COVID-19 declaration under Section 564(b)(1) of the Act, 21 U.S.C. section 360bbb-3(b)(1), unless the authorization is terminated or revoked.  Performed at [facility], [address], [place], [place] [address]   MRSA Next Gen by PCR, Nasal     Status: None   Collection Time: 04/22/24  6:26 PM   Specimen: Nasal Mucosa; Nasal Swab  Result Value Ref Range Status   MRSA by PCR Next Gen NOT DETECTED NOT DETECTED  Final    Comment: (NOTE) The GeneXpert MRSA Assay (FDA approved for NASAL specimens only), is one component of a comprehensive MRSA colonization surveillance program. It is not intended to diagnose MRSA infection nor to guide or monitor treatment for MRSA infections. Test performance is not FDA approved in patients less than 52 years old. Performed at [facility], [address] [place], [place] [address]   Respiratory (~20 pathogens) panel by PCR     Status: None   Collection Time: 04/22/24  7:32 PM   Specimen: Nasopharyngeal Swab; Respiratory  Result Value Ref Range Status   Adenovirus NOT DETECTED NOT DETECTED Final   Coronavirus 229E NOT DETECTED NOT DETECTED Final    Comment: (NOTE) The Coronavirus on the Respiratory Panel, DOES NOT test for the novel  Coronavirus (2019 nCoV)    Coronavirus HKU1 NOT DETECTED NOT DETECTED Final   Coronavirus NL63 NOT DETECTED NOT DETECTED Final   Coronavirus OC43 NOT DETECTED NOT DETECTED Final   Metapneumovirus NOT DETECTED NOT DETECTED Final   Rhinovirus / Enterovirus NOT DETECTED NOT DETECTED Final   Influenza A NOT DETECTED NOT DETECTED Final   Influenza B NOT DETECTED NOT DETECTED Final   Parainfluenza Virus 1 NOT DETECTED NOT DETECTED Final   Parainfluenza Virus 2 NOT DETECTED  NOT DETECTED Final   Parainfluenza Virus 3 NOT DETECTED NOT DETECTED Final   Parainfluenza Virus 4 NOT DETECTED NOT DETECTED Final   Respiratory Syncytial Virus NOT DETECTED NOT DETECTED Final   Bordetella pertussis NOT DETECTED NOT DETECTED Final   Bordetella Parapertussis NOT DETECTED NOT DETECTED Final   Chlamydophila pneumoniae NOT DETECTED NOT DETECTED Final   Mycoplasma pneumoniae NOT DETECTED NOT DETECTED Final    Comment: Performed at [facility], [address] [place], [place] [address]     Labs:   Basic Metabolic Panel: Recent Labs  Lab 04/22/24 1436 04/22/24 1552 04/23/24 0528 04/24/24 0444 04/25/24 0452 04/26/24 0512  NA  137 137 137 132* 136 133*  K 4.2 4.1 4.3 4.1 3.7 4.7  CL 100 99 104 104 103 102  CO2 23  --  24 22 23 24   GLUCOSE 97 87 81 82 87 88  BUN 19 20 18 14 16 9   CREATININE 1.78* 1.70* 1.17 0.98 0.98 0.85  CALCIUM  9.3  --  8.9 8.9 8.7* 8.9  MG  --   --  1.2* 2.2  --   --   PHOS  --   --  2.5 2.3* 3.8  --    Liver Function Tests: Recent Labs  Lab 04/22/24 1436  AST 92*  ALT 21  ALKPHOS 48  BILITOT 0.9  PROT 6.0*  ALBUMIN 3.1*    CBC: Recent Labs  Lab 04/22/24 1436 04/22/24 1552 04/23/24 0528 04/24/24 0444 04/25/24 0452 04/26/24 0512  WBC 19.3*  --  20.3* 13.5* 8.9 5.9  NEUTROABS 17.8*  --   --   --   --   --   HGB 12.1* 12.2* 10.3* 10.6* 9.5* 9.8*  HCT 37.0* 36.0* 30.6* 31.7* 28.0* 29.0*  MCV 93.4  --  91.1 91.1 89.7 89.8  PLT 208  --  178 164 182 185     IMAGING STUDIES DG Chest Port 1 View Result Date: 04/22/2024 EXAM: 1 VIEW(S) XRAY OF THE CHEST 04/22/2024 05:35:24 PM COMPARISON: Chest x-ray 01/29/2023. CLINICAL HISTORY: Questionable sepsis - evaluate for abnormality. FINDINGS: LUNGS AND PLEURA: There are patchy right lower lobe airspace opacities worrisome for pneumonia. No pulmonary edema. No pleural effusion. No pneumothorax. HEART AND MEDIASTINUM: No acute abnormality of the cardiac and mediastinal silhouettes. BONES AND SOFT TISSUES: No acute osseous abnormality. IMPRESSION: 1. Patchy right lower lobe airspace opacities, worrisome for pneumonia. Electronically signed by: Greig Pique MD 04/22/2024 06:06 PM EST RP Workstation: [id number]   DG Hand Complete Right Result Date: 04/04/2024 EXAM: 3 OR MORE VIEW(S) XRAY OF THE HAND 04/04/2024 07:53:00 AM COMPARISON: None available. CLINICAL HISTORY: fall - hand pain. Reason for exam: fall hnd pain; Per triage: Pt BIB [facility] c/o getting up from bed, falling face forward onto the floor and hitting his anterior face/nose causing a nose bleed. Bleeding stopped shortly after the fall. Pt not on thinners. Pt was able to get back into  bed and c/o still ; feeling like he is dizzy/room is spinning. PMHx of vertigo a long time ago. Pt A/Ox4 and vitally stable. FINDINGS: BONES AND JOINTS: No acute fracture. No focal osseous lesion. No joint dislocation. Degenerative changes at the distal radioulnar joint. Moderate degenerative changes at the first carpometacarpal joint with joint space narrowing and osteophyte formation. Chondrocalcinosis of the triangular fibrocartilage. SOFT TISSUES: Vascular calcifications noted. IMPRESSION: 1. No acute osseous abnormality. 2. Moderate degenerative changes at the first carpometacarpal joint with joint space narrowing and osteophyte formation. Electronically signed  by: Evalene Coho MD 04/04/2024 08:05 AM EDT RP Workstation: [id number]   DG Hand Complete Left Result Date: 04/04/2024 EXAM: 3 OR MORE VIEW(S) XRAY OF THE LEFT HAND 04/04/2024 07:53:00 AM COMPARISON: None available. CLINICAL HISTORY: fall - hand pain. Reason for exam: fall hnd pain; Per triage: Pt BIB [facility] c/o getting up from bed, falling face forward onto the floor and hitting his anterior face/nose causing a nose bleed. Bleeding stopped shortly after the fall. Pt not on thinners. Pt was able to get back into bed and c/o still ; feeling like he is dizzy/room is spinning. PMHx of vertigo a long time ago. Pt A/Ox4 and vitally stable. FINDINGS: BONES AND JOINTS: No acute fracture. No focal osseous lesion. No joint dislocation. Chondrocalcinosis of the wrist. Degenerative changes of the first CMC. SOFT TISSUES: Vascular calcifications are present in the soft tissues. IMPRESSION: 1. No acute osseous abnormality. 2. Chondrocalcinosis of the wrist and degenerative changes of the first CMC. Electronically signed by: Evalene Coho MD 04/04/2024 08:03 AM EDT RP Workstation: [id number]   CT CERVICAL SPINE WO CONTRAST Result Date: 04/04/2024 EXAM: CT CERVICAL SPINE WITHOUT CONTRAST 04/04/2024 07:44:53 AM TECHNIQUE: CT of the cervical spine was  performed without the administration of intravenous contrast. Multiplanar reformatted images are provided for review. Automated exposure control, iterative reconstruction, and/or weight based adjustment of the mA/kV was utilized to reduce the radiation dose to as low as reasonably achievable. COMPARISON: None available. CLINICAL HISTORY: Polytrauma, blunt. Pt BIB [facility] c/o getting up from bed, falling face forward onto the floor and hitting his anterior face/nose causing a nose bleed. Bleeding stopped shortly after the fall. Pt not on thinners. Pt was able to get back into bed and c/o still feeling like he is dizzy/room is spinning. PMHx of vertigo a long time ago. Pt A/Ox4 and vitally stable. FINDINGS: CERVICAL SPINE: BONES AND ALIGNMENT: No acute fracture or traumatic malalignment. There is mild dextrocurvature. DEGENERATIVE CHANGES: There is mild diffuse degenerative disc disease and facet arthrosis throughout the cervical spine. SOFT TISSUES: No prevertebral soft tissue swelling. There is moderate calcific atheromatous disease within the carotid bulbs bilaterally. IMPRESSION: 1. No acute abnormality of the cervical spine related to the reported polytrauma. 2. Mild diffuse degenerative disc disease and facet arthrosis throughout the cervical spine. 3. Mild dextrocurvature of the cervical spine. 4. Moderate calcific atheromatous disease within the carotid bulbs bilaterally. Electronically signed by: Evalene Coho MD 04/04/2024 07:58 AM EDT RP Workstation: [id number]   CT Maxillofacial Wo Contrast Result Date: 04/04/2024 EXAM: CT OF THE FACE WITHOUT CONTRAST 04/04/2024 07:44:53 AM TECHNIQUE: CT of the face was performed without the administration of intravenous contrast. Multiplanar reformatted images are provided for review. Automated exposure control, iterative reconstruction, and/or weight based adjustment of the mA/kV was utilized to reduce the radiation dose to as low as reasonably achievable.  COMPARISON: None available. CLINICAL HISTORY: Facial trauma, blunt; fall onto face / nose. Pt BIB [facility] c/o getting up from bed, falling face forward onto the floor and hitting his anterior face/nose causing a nose bleed. Bleeding stopped shortly after the fall. Pt not on thinners. Pt was able to get back into bed and c/o still feeling like ; he is dizzy/room is spinning. PMHx of vertigo a long time ago. Pt A/Ox4 and vitally stable FINDINGS: FACIAL BONES: No acute facial fracture. No mandibular dislocation. No suspicious bone lesion. The patient is edentulous. ORBITS: Globes are intact. No acute traumatic injury. No inflammatory change. Patient is status post bilateral lens replacement.  SINUSES AND MASTOIDS: There is a mucous retention cyst in the floor of the right maxillary sinus. No acute abnormality. SOFT TISSUES: No acute abnormality. IMPRESSION: 1. No acute facial fracture. Electronically signed by: Evalene Coho MD 04/04/2024 07:56 AM EDT RP Workstation: [id number]   CT HEAD WO CONTRAST Result Date: 04/04/2024 EXAM: CT HEAD WITHOUT CONTRAST 04/04/2024 07:44:53 AM TECHNIQUE: CT of the head was performed without the administration of intravenous contrast. Automated exposure control, iterative reconstruction, and/or weight based adjustment of the mA/kV was utilized to reduce the radiation dose to as low as reasonably achievable. COMPARISON: MRI of the head dated 11/30/2017. CLINICAL HISTORY: Polytrauma, blunt. Pt BIB [facility] c/o getting up from bed, falling face forward onto the floor and hitting his anterior face/nose causing a nose bleed. Bleeding stopped shortly after the fall. Pt not on thinners. Pt was able to get back into bed and c/o still feeling like ; he is dizzy/room is spinning. PMHx of vertigo a long time ago. Pt A/Ox4 and vitally stable FINDINGS: BRAIN AND VENTRICLES: No acute hemorrhage. No evidence of acute infarct. No hydrocephalus. No mass effect or midline shift. There is generalized  cerebral and cerebellar volume loss present, which is convincing for the patient's age. There is mild cerebral white matter disease. There are prominent extra-axial fluid spaces overlying the frontal lobes bilaterally. There is moderate calcific atheromatous disease within the carotid siphons. ORBITS: No acute abnormality. The patient is status post bilateral lens replacement. SINUSES: No acute abnormality. SOFT TISSUES AND SKULL: No acute soft tissue abnormality. No skull fracture. IMPRESSION: 1. No acute intracranial abnormality related to the reported fall and dizziness. 2. Generalized cerebral and cerebellar volume loss, mild cerebral white matter disease, and prominent extra-axial fluid spaces overlying the frontal lobes bilaterally, consistent with the patient's age. 3. Moderate calcific atheromatous disease within the carotid siphons. Electronically signed by: Evalene Coho MD 04/04/2024 07:55 AM EDT RP Workstation: [id number]    DISCHARGE EXAMINATION: Vitals:   04/25/24 2045 04/26/24 0347 04/26/24 0500 04/26/24 1037  BP:  (!) 124/54  (!) 146/74  Pulse:  80  68  Resp:  17  18  Temp:  98.5 F (36.9 C)  98.5 F (36.9 C)  TempSrc:  Oral  Oral  SpO2: 93% 96%  98%  Weight:   55.5 kg   Height:       General appearance: Awake alert.  In no distress Resp: Clear to auscultation bilaterally.  Normal effort Cardio: S1-S2 is normal regular.  No S3-S4.  No rubs murmurs or bruit GI: Abdomen is soft.  Nontender nondistended.  Bowel sounds are present normal.  No masses organomegaly    DISPOSITION: Home  Discharge Instructions     Call MD for:  difficulty breathing, headache or visual disturbances   Complete by: As directed    Call MD for:  extreme fatigue   Complete by: As directed    Call MD for:  persistant dizziness or light-headedness   Complete by: As directed    Call MD for:  persistant nausea and vomiting   Complete by: As directed    Call MD for:  severe uncontrolled pain    Complete by: As directed    Call MD for:  temperature >100.4   Complete by: As directed    Diet general   Complete by: As directed    Discharge instructions   Complete by: As directed    Please take your medications as prescribed.  Please be sure to follow-up with your primary  care provider within about 1 week after discharge.  You were cared for by a hospitalist during your hospital stay. If you have any questions about your discharge medications or the care you received while you were in the hospital after you are discharged, you can call the unit and asked to speak with the hospitalist on call if the hospitalist that took care of you is not available. Once you are discharged, your primary care physician will handle any further medical issues. Please note that NO REFILLS for any discharge medications will be authorized once you are discharged, as it is imperative that you return to your primary care physician (or establish a relationship with a primary care physician if you do not have one) for your aftercare needs so that they can reassess your need for medications and monitor your lab values. If you do not have a primary care physician, you can call [phone] for a physician referral.   Increase activity slowly   Complete by: As directed    No wound care   Complete by: As directed          Allergies as of 04/26/2024       Reactions   Bee Venom Anaphylaxis, Swelling        Medication List     STOP taking these medications    Magnesium  400 MG Caps       TAKE these medications    acetaminophen  500 MG tablet Commonly known as: TYLENOL  Take 1,000 mg by mouth every 6 (six) hours as needed for mild pain or moderate pain.   aspirin  EC 81 MG tablet Take 81 mg by mouth daily.   atorvastatin  80 MG tablet Commonly known as: LIPITOR  Take 1 tablet (80 mg total) by mouth daily.   azithromycin 500 MG tablet Commonly known as: ZITHROMAX Take 1 tablet (500 mg total) by mouth  daily for 2 days.   balsalazide 750 MG capsule Commonly known as: COLAZAL  Take 2,250 mg by mouth in the morning and at bedtime.   cefdinir 300 MG capsule Commonly known as: OMNICEF Take 1 capsule (300 mg total) by mouth 2 (two) times daily for 4 days.   cyanocobalamin  1000 MCG tablet Take 1 tablet (1,000 mcg total) by mouth daily.   donepezil 5 MG tablet Commonly known as: ARICEPT Take 5 mg by mouth at bedtime.   Imodium A-D 2 MG tablet Generic drug: loperamide Take 2 mg by mouth daily. Takes one daily   levothyroxine 50 MCG tablet Commonly known as: SYNTHROID Take 50 mcg by mouth daily before breakfast.   meclizine 12.5 MG tablet Commonly known as: ANTIVERT Take 1 tablet (12.5 mg total) by mouth 3 (three) times daily as needed for dizziness.   MULTIVITAMIN PO Take by mouth.   nitroGLYCERIN  0.4 MG SL tablet Commonly known as: NITROSTAT  Place 1 tablet (0.4 mg total) under the tongue every 5 (five) minutes as needed for chest pain.   omeprazole 20 MG capsule Commonly known as: PRILOSEC Take 20 mg by mouth 3 (three) times a week.   ramipril  5 MG capsule Commonly known as: ALTACE  Take 1 capsule (5 mg total) by mouth daily.   tamsulosin 0.4 MG Caps capsule Commonly known as: FLOMAX Take 1 capsule (0.4 mg total) by mouth daily for 7 days.               Durable Medical Equipment  (From admission, onward)           Start  Ordered   04/25/24 1520  For home use only DME oxygen  Once       Question Answer Comment  Length of Need 6 Months   Mode or (Route) Nasal cannula   Liters per Minute 2   Frequency Continuous (stationary and portable oxygen unit needed)   Oxygen conserving device Yes   Oxygen delivery system: Gas      04/25/24 1519              Follow-up Information     [facility] Follow up.   Specialty: Home Health Services Why: Home health has been arranged. They will contact you to schedule apt within 48 hr post  discharge Contact information: [address] [place] [address] [phone]         [facility] (DME) Follow up.   Specialty: DME Services Contact information: [address] [place]  [address] [phone]        [name], MD. Schedule an appointment as soon as possible for a visit in 1 week(s).   Specialty: Family Medicine Why: post hospitalization follow up Contact information: [address] [place] [address] [phone]                 TOTAL DISCHARGE TIME: 35 minutes  Findlay Dagher Verdene  [facility] Pager on [email or web address]  04/26/2024, 10:53 AM

## 2024-04-27 LAB — CULTURE, BLOOD (ROUTINE X 2)
Culture: NO GROWTH
Culture: NO GROWTH
Special Requests: ADEQUATE

## 2024-04-29 DIAGNOSIS — J9691 Respiratory failure, unspecified with hypoxia: Secondary | ICD-10-CM | POA: Diagnosis not present

## 2024-04-29 DIAGNOSIS — E538 Deficiency of other specified B group vitamins: Secondary | ICD-10-CM | POA: Diagnosis not present

## 2024-04-29 DIAGNOSIS — R748 Abnormal levels of other serum enzymes: Secondary | ICD-10-CM | POA: Diagnosis not present

## 2024-04-29 DIAGNOSIS — R3911 Hesitancy of micturition: Secondary | ICD-10-CM | POA: Diagnosis not present

## 2024-04-29 DIAGNOSIS — D649 Anemia, unspecified: Secondary | ICD-10-CM | POA: Diagnosis not present

## 2024-04-29 DIAGNOSIS — I119 Hypertensive heart disease without heart failure: Secondary | ICD-10-CM | POA: Diagnosis not present

## 2024-04-29 DIAGNOSIS — J189 Pneumonia, unspecified organism: Secondary | ICD-10-CM | POA: Diagnosis not present

## 2024-04-29 DIAGNOSIS — F0390 Unspecified dementia without behavioral disturbance: Secondary | ICD-10-CM | POA: Diagnosis not present

## 2024-04-29 DIAGNOSIS — K5090 Crohn's disease, unspecified, without complications: Secondary | ICD-10-CM | POA: Diagnosis not present

## 2024-04-29 DIAGNOSIS — R42 Dizziness and giddiness: Secondary | ICD-10-CM | POA: Diagnosis not present

## 2024-05-06 DIAGNOSIS — K5090 Crohn's disease, unspecified, without complications: Secondary | ICD-10-CM | POA: Diagnosis not present

## 2024-05-26 ENCOUNTER — Other Ambulatory Visit (HOSPITAL_COMMUNITY): Payer: Self-pay

## 2024-06-02 DIAGNOSIS — R945 Abnormal results of liver function studies: Secondary | ICD-10-CM | POA: Diagnosis not present

## 2024-06-17 ENCOUNTER — Telehealth: Payer: Self-pay | Admitting: Physician Assistant

## 2024-06-17 NOTE — Telephone Encounter (Signed)
 Pt called saying that he has been but on Prednisone which will not end 06/28/24, And Hydrocortisone Ac 25 mg suppositories, which will not end until 06/20/24. He is wondering if this will affect his next cortisone injection. Call back number [phone]

## 2024-06-17 NOTE — Telephone Encounter (Signed)
 Patient aware

## 2024-06-20 ENCOUNTER — Other Ambulatory Visit: Payer: Self-pay | Admitting: Student

## 2024-06-20 DIAGNOSIS — K921 Melena: Secondary | ICD-10-CM

## 2024-06-30 ENCOUNTER — Ambulatory Visit: Admitting: Physician Assistant

## 2024-06-30 DIAGNOSIS — M1712 Unilateral primary osteoarthritis, left knee: Secondary | ICD-10-CM

## 2024-06-30 DIAGNOSIS — G8929 Other chronic pain: Secondary | ICD-10-CM

## 2024-06-30 DIAGNOSIS — M170 Bilateral primary osteoarthritis of knee: Secondary | ICD-10-CM

## 2024-06-30 DIAGNOSIS — M25512 Pain in left shoulder: Secondary | ICD-10-CM

## 2024-06-30 DIAGNOSIS — M1711 Unilateral primary osteoarthritis, right knee: Secondary | ICD-10-CM

## 2024-06-30 MED ORDER — LIDOCAINE HCL 1 % IJ SOLN
5.0000 mL | INTRAMUSCULAR | Status: AC | PRN
  Administered 2024-06-30: 5 mL

## 2024-06-30 MED ORDER — METHYLPREDNISOLONE ACETATE 40 MG/ML IJ SUSP
40.0000 mg | INTRAMUSCULAR | Status: AC | PRN
  Administered 2024-06-30: 40 mg via INTRA_ARTICULAR

## 2024-06-30 MED ORDER — LIDOCAINE HCL 1 % IJ SOLN
3.0000 mL | INTRAMUSCULAR | Status: AC | PRN
  Administered 2024-06-30: 3 mL

## 2024-06-30 NOTE — Progress Notes (Signed)
" ° °  Procedure Note  Patient: Colin Larson             Date of Birth: [birth year]           MRN: [id number]             Visit Date: 06/30/2024 HPI: Mr. Lundin 89 year old male comes in today requesting injection of both knees and left shoulder.  He was last seen on 03/07/2024 and each of these joints were injected.  He states he did well with these.  He has had no new injury to either knee or the left shoulder.  Does feel that he has fluid on his right knee.  Denies any fevers chills.  He is not diabetic.  Known end-stage arthritis of the left shoulder.  He also has known osteoarthritis of both knees.   Physical exam: General Well-developed well-nourished male no acute distress able to get on and off the exam table on his own.  Ambulates without any assistive device. Bilateral knees: No abnormal warmth or erythema.  Effusion both knees.  Good range of motion of both knees with patellofemoral crepitus. Left shoulder: Crepitus with passive range of motion.  Forward flexion actively and passively to 160 degrees.   Procedures: Visit Diagnoses:  1. Chronic left shoulder pain   2. Unilateral primary osteoarthritis, right knee   3. Unilateral primary osteoarthritis, left knee     Large Joint Inj: bilateral knee on 06/30/2024 3:48 PM Indications: pain Details: 22 G 1.5 in needle, anterolateral approach  Arthrogram: No  Medications (Right): 5 mL lidocaine  1 %; 40 mg methylPREDNISolone  acetate 40 MG/ML Aspirate (Right): 40 mL Medications (Left): 5 mL lidocaine  1 %; 40 mg methylPREDNISolone  acetate 40 MG/ML Aspirate (Left): 17 mL Outcome: tolerated well, no immediate complications Procedure, treatment alternatives, risks and benefits explained, specific risks discussed. Consent was given by the patient. Immediately prior to procedure a time out was called to verify the correct patient, procedure, equipment, support staff and site/side marked as required. Patient was prepped and draped in the  usual sterile fashion.    Large Joint Inj: L subacromial bursa on 06/30/2024 3:48 PM Indications: pain Details: 22 G 1.5 in needle, superior approach  Arthrogram: No  Medications: 3 mL lidocaine  1 %; 40 mg methylPREDNISolone  acetate 40 MG/ML Outcome: tolerated well, no immediate complications Procedure, treatment alternatives, risks and benefits explained, specific risks discussed. Consent was given by the patient. Immediately prior to procedure a time out was called to verify the correct patient, procedure, equipment, support staff and site/side marked as required. Patient was prepped and draped in the usual sterile fashion.    Plan: He knows to wait about 4 to 5 months between injections.  He will follow-up with us  as needed.  Questions were encouraged and answered.    "

## 2024-07-01 ENCOUNTER — Ambulatory Visit
Admission: RE | Admit: 2024-07-01 | Discharge: 2024-07-01 | Disposition: A | Discharge status: Home / Self Care | Source: Ambulatory Visit | Attending: Student | Admitting: Student

## 2024-07-01 DIAGNOSIS — K921 Melena: Secondary | ICD-10-CM

## 2024-07-01 MED ORDER — IOPAMIDOL (ISOVUE-300) INJECTION 61%
500.0000 mL | Freq: Once | INTRAVENOUS | Status: AC | PRN
  Administered 2024-07-01: 100 mL via INTRAVENOUS

## 2024-07-07 NOTE — Progress Notes (Unsigned)
 " Cardiology Office Note:    Date:  07/08/2024   ID:  Colin Larson, DOB May 09, 1935, MRN [id number]  PCP:  [name], MD   [facility] Providers Cardiologist:  [name], MD     Referring MD: [name], MD   Chief Complaint  Patient presents with   Coronary Artery Disease    History of Present Illness:    Colin Larson is a 89 y.o. male seen for follow up CAD. He is a former patient of Dr Reek. He has a history of CAD (MI 2000 s/p BMS to prox/mid RCA, 70% lesion in abberant LCx that arises from right coronary cusp), carotid disease, mild AS, HTN, HLD, arthritis, Crohn's disease, hiatal hernia, GIB (from diverticulosis/Crohn's 2014), melanoma of the skin.   He was admitted in November with community acquired PNA with septic shock. Managed with antibiotics.  He states he is doing very well. Walks 35-60 minutes a day. No chest pain or dyspnea. Mild swelling in left ankle. No palpitations.    Past Medical History:  Diagnosis Date   Anemia    Arthritis    Carotid artery disease    Coronary artery disease    a. MI 2000 s/p BMS to prox/mid RCA, 70% lesion in abberant LCx that arises from right coronary cusp per Dr. Johnice note   Crohn disease [facility])    GI bleeding    2014 - from diverticulosis/Crohn's 2014   Hiatal hernia    no surgery   Hypercholesteremia    Hypertension    Melanoma (HCC) 01/07/2012   MI (myocardial infarction) (HCC) 05/31/1999   Mild aortic stenosis    Mild mitral regurgitation    Skin cancer    Vertigo     Past Surgical History:  Procedure Laterality Date   CHOLECYSTECTOMY     CORONARY STENT PLACEMENT  [id number]   INGUINAL HERNIA REPAIR     x 2    Current Medications: Current Meds  Medication Sig   acetaminophen  (TYLENOL ) 500 MG tablet Take 1,000 mg by mouth every 6 (six) hours as needed for mild pain or moderate pain.   aspirin  EC 81 MG tablet Take 81 mg by mouth daily.   balsalazide (COLAZAL ) 750 MG  capsule Take 2,250 mg by mouth in the morning and at bedtime.   cyanocobalamin  1000 MCG tablet Take 1 tablet (1,000 mcg total) by mouth daily.   donepezil  (ARICEPT ) 5 MG tablet Take 5 mg by mouth at bedtime.   levothyroxine  (SYNTHROID ) 50 MCG tablet Take 50 mcg by mouth daily before breakfast.   loperamide  (IMODIUM  A-D) 2 MG tablet Take 2 mg by mouth daily. Takes one daily   Multiple Vitamin (MULTIVITAMIN PO) Take by mouth.   nitroGLYCERIN  (NITROSTAT ) 0.4 MG SL tablet Place 1 tablet (0.4 mg total) under the tongue every 5 (five) minutes as needed for chest pain.   omeprazole (PRILOSEC) 20 MG capsule Take 20 mg by mouth 3 (three) times a week.   [DISCONTINUED] atorvastatin  (LIPITOR ) 80 MG tablet Take 1 tablet (80 mg total) by mouth daily.   [DISCONTINUED] ramipril  (ALTACE ) 5 MG capsule Take 1 capsule (5 mg total) by mouth daily.     Allergies:   Bee venom   Social History   Socioeconomic History   Marital status: Married    Spouse name: Not on file   Number of children: Not on file   Years of education: Not on file   Highest education level: Not on file  Occupational  History   Not on file  Tobacco Use   Smoking status: Former    Current packs/day: 0.00    Average packs/day: 0.1 packs/day for 5.0 years (0.7 ttl pk-yrs)    Types: Cigarettes    Start date: 02/14/1970    Quit date: 02/15/1975    Years since quitting: 49.4   Smokeless tobacco: Never  Substance and Sexual Activity   Alcohol use: No   Drug use: Not on file   Sexual activity: Not on file  Other Topics Concern   Not on file  Social History Narrative   Not on file   Social Drivers of Health   Tobacco Use: Medium Risk (07/08/2024)   Patient History    Smoking Tobacco Use: Former    Smokeless Tobacco Use: Never    Passive Exposure: Not on Actuary Strain: Not on file  Food Insecurity: No Food Insecurity (04/24/2024)   Epic    Worried About Programme Researcher, Broadcasting/film/video in the Last Year: Never true    Ran Out  of Food in the Last Year: Never true  Transportation Needs: No Transportation Needs (04/24/2024)   Epic    Lack of Transportation (Medical): No    Lack of Transportation (Non-Medical): No  Physical Activity: Not on file  Stress: Not on file  Social Connections: Moderately Integrated (04/24/2024)   Social Connection and Isolation Panel    Frequency of Communication with Friends and Family: Twice a week    Frequency of Social Gatherings with Friends and Family: Once a week    Attends Religious Services: More than 4 times per year    Active Member of [employer] or Organizations: No    Attends Banker Meetings: Never    Marital Status: Married  Depression (PHQ2-9): Not on file  Alcohol Screen: Not on file  Housing: Low Risk (04/24/2024)   Epic    Unable to Pay for Housing in the Last Year: No    Number of Times Moved in the Last Year: 0    Homeless in the Last Year: No  Utilities: Not At Risk (04/24/2024)   Epic    Threatened with loss of utilities: No  Health Literacy: Not on file     Family History: The patient's family history includes Cancer in his mother; Stroke in his father.  ROS:   Please see the history of present illness.     All other systems reviewed and are negative.  EKGs/Labs/Other Studies Reviewed:    The following studies were reviewed today: Echo 12/01/17: Study Conclusions   - Left ventricle: The cavity size was normal. There was moderate    concentric hypertrophy. Systolic function was vigorous. The    estimated ejection fraction was in the range of 65% to 70%. Wall    motion was normal; there were no regional wall motion    abnormalities. Doppler parameters are consistent with abnormal    left ventricular relaxation (grade 1 diastolic dysfunction). The    E/e&' ratio is >15, suggesting elevated LV filling pressure.  - Aortic valve: Mildly calcified leaflets. Mild to moderate    stenosis. Mean gradient (S): 10 mm Hg. Peak gradient (S): 27 mm    Hg.  Valve area (VTI): 1.31 cm^2. Valve area (Vmax): 1.09 cm^2.    Valve area (Vmean): 1.24 cm^2.  - Mitral valve: Mildly thickened leaflets . There was mild    regurgitation.  - Left atrium: The atrium was mildly dilated.  - Inferior vena cava: The  vessel was dilated. The respirophasic    diameter changes were blunted (< 50%), consistent with elevated    central venous pressure.   Holter monitor 01/07/18: Study Highlights  Normal sinus rhythm with PACs, PVCs. Short runs of SVT, up to 163 bpm, 11 beats longest duration. Average HR 71 bpm. Slowest HR 46 bpm presumably during sleep.  Carotid dopplers 08/04/22: ummary:  Right Carotid: Velocities in the right ICA are consistent with a 40-59%                 stenosis. The ECA appears >50% stenosed.   Left Carotid: Velocities in the left ICA are consistent with a 1-39%  stenosis.   Vertebrals: Bilateral vertebral arteries demonstrate antegrade flow.  Subclavians: Normal flow hemodynamics were seen in bilateral subclavian               arteries.       Recent Labs: 04/22/2024: ALT 21; TSH 2.015 04/24/2024: Magnesium  2.2 04/26/2024: BUN 9; Creatinine, Ser 0.85; Hemoglobin 9.8; Platelets 185; Potassium 4.7; Sodium 133  Recent Lipid Panel No results found for: CHOL, TRIG, HDL, CHOLHDL, VLDL, LDLCALC, LDLDIRECT Dated 10/10/22: cholesterol 118, triglycerides 77, HDL 34, LDL 69. CMET normal Dated 04/16/23: ALT 50. Otherwise CMET normal. TSH normal. Dated 10/15/23: cholesterol 113, triglycerides 70, HDL 36, LDL 62  Risk Assessment/Calculations:                Physical Exam:    VS:  BP (!) 122/52 (BP Location: Left Arm, Patient Position: Sitting, Cuff Size: Normal)   Pulse 61   Ht 5' 5 (1.651 m)   Wt 123 lb 3.2 oz (55.9 kg)   SpO2 96%   BMI 20.50 kg/m     Wt Readings from Last 3 Encounters:  07/08/24 123 lb 3.2 oz (55.9 kg)  04/26/24 122 lb 5.7 oz (55.5 kg)  04/04/24 126 lb 1.7 oz (57.2 kg)     GEN:  Well nourished,  well developed in no acute distress HEENT: Normal NECK: No JVD; bilateral carotid bruits LYMPHATICS: No lymphadenopathy CARDIAC: RRR, gr 2/6 systolic murmur at the apex, no rubs, gallops RESPIRATORY:  Clear to auscultation without rales, wheezing or rhonchi  ABDOMEN: Soft, non-tender, non-distended MUSCULOSKELETAL:  1+ L ankle edema; No deformity  SKIN: Warm and dry NEUROLOGIC:  Alert and oriented x 3 PSYCHIATRIC:  Normal affect   ASSESSMENT:    1. Bilateral carotid artery stenosis   2. Coronary artery disease involving native coronary artery of native heart with other form of angina pectoris   3. Mild aortic stenosis   4. Mixed hyperlipidemia   5. Hypertensive heart disease without heart failure     PLAN:    In order of problems listed above:  CAD: remote stenting of the RCA with BMS in 2000. 70% stenosis in anomalous LCx. No angina on medical therapy.  Continue aspirin .  High dose atorvastatin .   Carotid artery disease: 40-59% RICA disease by doppler March 2025. Unchanged. Yearly follow up  Mild- moderate  AS: noted in 2019. Will update Echo now.   Hypertensive heart disease: Controlled on low dose ramipril .   Hyperlipidemia. LDL 662  on lipitor  80 mg daily.  Chronic RBBB Mild edema. Encourage elevation and support hose.            Medication Adjustments/Labs and Tests Ordered: Current medicines are reviewed at length with the patient today.  Concerns regarding medicines are outlined above.  No orders of the defined types were placed in this encounter.  Meds ordered this encounter  Medications   ramipril  (ALTACE ) 5 MG capsule    Sig: Take 1 capsule (5 mg total) by mouth daily.    Dispense:  90 capsule    Refill:  3   atorvastatin  (LIPITOR ) 80 MG tablet    Sig: Take 1 tablet (80 mg total) by mouth daily.    Dispense:  90 tablet    Refill:  3    Patient Instructions  Medication Instructions:   *If you need a refill on your cardiac medications before your next  appointment, please call your pharmacy*  Lab Work:    Testing/Procedures:   Follow-Up: At [facility], you and your health needs are our priority.  As part of our continuing mission to provide you with exceptional heart care, our providers are all part of one team.  This team includes your primary Cardiologist (physician) and Advanced Practice Providers or APPs (Physician Assistants and Nurse Practitioners) who all work together to provide you with the care you need, when you need it.  Your next appointment:      Provider:     We recommend signing up for the patient portal called MyChart.  Sign up information is provided on this After Visit Summary.  MyChart is used to connect with patients for Virtual Visits (Telemedicine).  Patients are able to view lab/test results, encounter notes, upcoming appointments, etc.  Non-urgent messages can be sent to your provider as well.   To learn more about what you can do with MyChart, go to [email or web address].             Signed, Saraiah Bhat, MD  07/08/2024 9:02 AM    [facility]  "

## 2024-07-08 ENCOUNTER — Ambulatory Visit: Attending: Cardiology | Admitting: Cardiology

## 2024-07-08 ENCOUNTER — Encounter: Payer: Self-pay | Admitting: Cardiology

## 2024-07-08 VITALS — BP 122/52 | HR 61 | Ht 65.0 in | Wt 123.2 lb

## 2024-07-08 DIAGNOSIS — I350 Nonrheumatic aortic (valve) stenosis: Secondary | ICD-10-CM | POA: Insufficient documentation

## 2024-07-08 DIAGNOSIS — I25118 Atherosclerotic heart disease of native coronary artery with other forms of angina pectoris: Secondary | ICD-10-CM | POA: Diagnosis present

## 2024-07-08 DIAGNOSIS — I119 Hypertensive heart disease without heart failure: Secondary | ICD-10-CM | POA: Diagnosis present

## 2024-07-08 DIAGNOSIS — I6523 Occlusion and stenosis of bilateral carotid arteries: Secondary | ICD-10-CM | POA: Diagnosis present

## 2024-07-08 DIAGNOSIS — E782 Mixed hyperlipidemia: Secondary | ICD-10-CM | POA: Diagnosis present

## 2024-07-08 MED ORDER — RAMIPRIL 5 MG PO CAPS: 5.0000 mg | ORAL_CAPSULE | Freq: Every day | ORAL | 3 refills | Status: AC

## 2024-07-08 MED ORDER — ATORVASTATIN CALCIUM 80 MG PO TABS: 80.0000 mg | ORAL_TABLET | Freq: Every day | ORAL | 3 refills | Status: AC

## 2024-07-08 NOTE — Patient Instructions (Addendum)
 Medication Instructions:  Continue same medications *If you need a refill on your cardiac medications before your next appointment, please call your pharmacy*  Lab Work: None ordered   Testing/Procedures: Schedule Echo after 3/24 Schedule Carotid dopplers after 3/24  Follow-Up: At [facility], you and your health needs are our priority.  As part of our continuing mission to provide you with exceptional heart care, our providers are all part of one team.  This team includes your primary Cardiologist (physician) and Advanced Practice Providers or APPs (Physician Assistants and Nurse Practitioners) who all work together to provide you with the care you need, when you need it.  Your next appointment:  1 year    Call in Oct to schedule Jan appointment     Provider:  Dr.Jordan   We recommend signing up for the patient portal called MyChart.  Sign up information is provided on this After Visit Summary.  MyChart is used to connect with patients for Virtual Visits (Telemedicine).  Patients are able to view lab/test results, encounter notes, upcoming appointments, etc.  Non-urgent messages can be sent to your provider as well.   To learn more about what you can do with MyChart, go to [email or web address].

## 2024-09-12 ENCOUNTER — Ambulatory Visit (HOSPITAL_COMMUNITY)
